# Patient Record
Sex: Male | Born: 1935 | Race: Black or African American | Hispanic: No | Marital: Married | Smoking: Former smoker
Health system: Southern US, Community
[De-identification: ages and names within clinical notes are randomized; demographics above are authoritative.]

## PROBLEM LIST (undated history)

## (undated) DIAGNOSIS — R61 Generalized hyperhidrosis: Secondary | ICD-10-CM

## (undated) DIAGNOSIS — J309 Allergic rhinitis, unspecified: Secondary | ICD-10-CM

## (undated) DIAGNOSIS — I6990 Unspecified sequelae of unspecified cerebrovascular disease: Secondary | ICD-10-CM

## (undated) DIAGNOSIS — T7840XA Allergy, unspecified, initial encounter: Secondary | ICD-10-CM

## (undated) DIAGNOSIS — H109 Unspecified conjunctivitis: Secondary | ICD-10-CM

## (undated) DIAGNOSIS — M545 Low back pain, unspecified: Secondary | ICD-10-CM

## (undated) DIAGNOSIS — K2100 Gastro-esophageal reflux disease with esophagitis, without bleeding: Secondary | ICD-10-CM

## (undated) DIAGNOSIS — R42 Dizziness and giddiness: Secondary | ICD-10-CM

## (undated) DIAGNOSIS — M542 Cervicalgia: Secondary | ICD-10-CM

## (undated) DIAGNOSIS — K5900 Constipation, unspecified: Secondary | ICD-10-CM

## (undated) DIAGNOSIS — D485 Neoplasm of uncertain behavior of skin: Secondary | ICD-10-CM

## (undated) DIAGNOSIS — I639 Cerebral infarction, unspecified: Secondary | ICD-10-CM

## (undated) DIAGNOSIS — M779 Enthesopathy, unspecified: Secondary | ICD-10-CM

## (undated) DIAGNOSIS — M1990 Unspecified osteoarthritis, unspecified site: Secondary | ICD-10-CM

## (undated) DIAGNOSIS — R21 Rash and other nonspecific skin eruption: Secondary | ICD-10-CM

## (undated) DIAGNOSIS — E785 Hyperlipidemia, unspecified: Secondary | ICD-10-CM

## (undated) DIAGNOSIS — R7309 Other abnormal glucose: Secondary | ICD-10-CM

## (undated) DIAGNOSIS — I10 Essential (primary) hypertension: Secondary | ICD-10-CM

## (undated) HISTORY — DX: Allergy, unspecified, initial encounter: T78.40XA

## (undated) HISTORY — DX: Unspecified conjunctivitis: H10.9

## (undated) HISTORY — PX: COLONOSCOPY: SHX174

## (undated) HISTORY — DX: Unspecified sequelae of unspecified cerebrovascular disease: I69.90

## (undated) HISTORY — DX: Low back pain, unspecified: M54.50

## (undated) HISTORY — DX: Gastro-esophageal reflux disease with esophagitis: K21.0

## (undated) HISTORY — DX: Other abnormal glucose: R73.09

## (undated) HISTORY — DX: Rash and other nonspecific skin eruption: R21

## (undated) HISTORY — DX: Essential (primary) hypertension: I10

## (undated) HISTORY — DX: Low back pain: M54.5

## (undated) HISTORY — DX: Gastro-esophageal reflux disease with esophagitis, without bleeding: K21.00

## (undated) HISTORY — DX: Neoplasm of uncertain behavior of skin: D48.5

## (undated) HISTORY — DX: Cervicalgia: M54.2

## (undated) HISTORY — DX: Generalized hyperhidrosis: R61

## (undated) HISTORY — DX: Enthesopathy, unspecified: M77.9

## (undated) HISTORY — DX: Hyperlipidemia, unspecified: E78.5

## (undated) HISTORY — DX: Dizziness and giddiness: R42

## (undated) HISTORY — DX: Unspecified osteoarthritis, unspecified site: M19.90

## (undated) HISTORY — DX: Allergic rhinitis, unspecified: J30.9

## (undated) HISTORY — DX: Cerebral infarction, unspecified: I63.9

## (undated) HISTORY — DX: Constipation, unspecified: K59.00

---

## 1999-06-05 ENCOUNTER — Other Ambulatory Visit: Admission: RE | Admit: 1999-06-05 | Discharge: 1999-06-05 | Payer: Self-pay | Admitting: Family Medicine

## 1999-07-23 ENCOUNTER — Encounter (INDEPENDENT_AMBULATORY_CARE_PROVIDER_SITE_OTHER): Payer: Self-pay | Admitting: Specialist

## 1999-07-23 ENCOUNTER — Ambulatory Visit (HOSPITAL_COMMUNITY): Admission: RE | Admit: 1999-07-23 | Discharge: 1999-07-23 | Payer: Self-pay | Admitting: Gastroenterology

## 2003-11-13 ENCOUNTER — Emergency Department (HOSPITAL_COMMUNITY): Admission: EM | Admit: 2003-11-13 | Discharge: 2003-11-13 | Payer: Self-pay | Admitting: Emergency Medicine

## 2005-10-27 ENCOUNTER — Inpatient Hospital Stay (HOSPITAL_COMMUNITY): Admission: EM | Admit: 2005-10-27 | Discharge: 2005-10-29 | Payer: Self-pay | Admitting: Emergency Medicine

## 2006-08-27 ENCOUNTER — Encounter: Admission: RE | Admit: 2006-08-27 | Discharge: 2006-08-27 | Payer: Self-pay | Admitting: *Deleted

## 2008-08-08 ENCOUNTER — Ambulatory Visit: Payer: Self-pay | Admitting: Cardiovascular Disease

## 2008-08-08 ENCOUNTER — Inpatient Hospital Stay (HOSPITAL_COMMUNITY): Admission: EM | Admit: 2008-08-08 | Discharge: 2008-08-10 | Payer: Self-pay | Admitting: Emergency Medicine

## 2008-08-09 ENCOUNTER — Encounter (INDEPENDENT_AMBULATORY_CARE_PROVIDER_SITE_OTHER): Payer: Self-pay | Admitting: Neurology

## 2008-11-28 ENCOUNTER — Emergency Department (HOSPITAL_COMMUNITY): Admission: EM | Admit: 2008-11-28 | Discharge: 2008-11-28 | Payer: Self-pay | Admitting: Emergency Medicine

## 2010-05-09 LAB — CBC
HCT: 31 % — ABNORMAL LOW (ref 39.0–52.0)
Hemoglobin: 10.6 g/dL — ABNORMAL LOW (ref 13.0–17.0)
MCHC: 34.2 g/dL (ref 30.0–36.0)
MCV: 90.1 fL (ref 78.0–100.0)
Platelets: 226 10*3/uL (ref 150–400)
RBC: 3.44 MIL/uL — ABNORMAL LOW (ref 4.22–5.81)
RDW: 12.8 % (ref 11.5–15.5)
WBC: 7.2 10*3/uL (ref 4.0–10.5)

## 2010-05-09 LAB — DIFFERENTIAL
Basophils Absolute: 0 10*3/uL (ref 0.0–0.1)
Basophils Relative: 0 % (ref 0–1)
Eosinophils Absolute: 0.2 10*3/uL (ref 0.0–0.7)
Eosinophils Relative: 3 % (ref 0–5)
Lymphocytes Relative: 21 % (ref 12–46)
Lymphs Abs: 1.5 10*3/uL (ref 0.7–4.0)
Monocytes Absolute: 0.3 10*3/uL (ref 0.1–1.0)
Monocytes Relative: 4 % (ref 3–12)
Neutro Abs: 5.2 10*3/uL (ref 1.7–7.7)
Neutrophils Relative %: 72 % (ref 43–77)

## 2010-05-09 LAB — BASIC METABOLIC PANEL
BUN: 8 mg/dL (ref 6–23)
CO2: 31 mEq/L (ref 19–32)
Calcium: 8.6 mg/dL (ref 8.4–10.5)
Chloride: 103 mEq/L (ref 96–112)
Creatinine, Ser: 0.77 mg/dL (ref 0.4–1.5)
GFR calc Af Amer: >60 mL/min (ref >60)
GFR calc non Af Amer: >60 mL/min (ref >60)
Glucose, Bld: 111 mg/dL — ABNORMAL HIGH (ref 70–99)
Potassium: 3.9 mEq/L (ref 3.5–5.1)
Sodium: 138 mEq/L (ref 135–145)

## 2010-05-09 LAB — CK TOTAL AND CKMB (NOT AT ARMC)
CK, MB: 4.7 ng/mL — ABNORMAL HIGH (ref 0.3–4.0)
Relative Index: 1.6 (ref 0.0–2.5)
Total CK: 296 U/L — ABNORMAL HIGH (ref 7–232)

## 2010-05-09 LAB — TROPONIN I: Troponin I: 0.01 ng/mL (ref 0.00–0.06)

## 2010-05-12 LAB — COMPREHENSIVE METABOLIC PANEL
ALT: 16 U/L (ref 0–53)
AST: 29 U/L (ref 0–37)
Albumin: 4 g/dL (ref 3.5–5.2)
Alkaline Phosphatase: 75 U/L (ref 39–117)
BUN: 12 mg/dL (ref 6–23)
CO2: 25 mEq/L (ref 19–32)
Calcium: 9.1 mg/dL (ref 8.4–10.5)
Chloride: 106 mEq/L (ref 96–112)
Creatinine, Ser: 0.68 mg/dL (ref 0.4–1.5)
GFR calc Af Amer: >60 mL/min (ref >60)
GFR calc non Af Amer: >60 mL/min (ref >60)
Glucose, Bld: 113 mg/dL — ABNORMAL HIGH (ref 70–99)
Potassium: 3.7 mEq/L (ref 3.5–5.1)
Sodium: 138 mEq/L (ref 135–145)
Total Bilirubin: 0.4 mg/dL (ref 0.3–1.2)
Total Protein: 7.9 g/dL (ref 6.0–8.3)

## 2010-05-12 LAB — CARDIAC PANEL(CRET KIN+CKTOT+MB+TROPI)
CK, MB: 7.3 ng/mL — ABNORMAL HIGH (ref 0.3–4.0)
Relative Index: 1.7 (ref 0.0–2.5)
Total CK: 425 U/L — ABNORMAL HIGH (ref 7–232)
Troponin I: 0.02 ng/mL (ref 0.00–0.06)

## 2010-05-12 LAB — URINALYSIS, ROUTINE W REFLEX MICROSCOPIC
Bilirubin Urine: NEGATIVE
Glucose, UA: NEGATIVE mg/dL
Hgb urine dipstick: NEGATIVE
Ketones, ur: NEGATIVE mg/dL
Nitrite: NEGATIVE
Protein, ur: NEGATIVE mg/dL
Specific Gravity, Urine: 1.015 (ref 1.005–1.030)
Urobilinogen, UA: 1 mg/dL (ref 0.0–1.0)
pH: 6 (ref 5.0–8.0)

## 2010-05-12 LAB — LIPID PANEL
Cholesterol: 150 mg/dL (ref 0–200)
HDL: 37 mg/dL — ABNORMAL LOW (ref >39)
LDL Cholesterol: 83 mg/dL (ref 0–99)
Total CHOL/HDL Ratio: 4.1 RATIO
Triglycerides: 151 mg/dL — ABNORMAL HIGH (ref <150)
VLDL: 30 mg/dL (ref 0–40)

## 2010-05-12 LAB — CBC
HCT: 35.6 % — ABNORMAL LOW (ref 39.0–52.0)
Hemoglobin: 12 g/dL — ABNORMAL LOW (ref 13.0–17.0)
MCHC: 33.7 g/dL (ref 30.0–36.0)
MCV: 88.2 fL (ref 78.0–100.0)
Platelets: 181 10*3/uL (ref 150–400)
RBC: 4.03 MIL/uL — ABNORMAL LOW (ref 4.22–5.81)
RDW: 13.6 % (ref 11.5–15.5)
WBC: 5.6 10*3/uL (ref 4.0–10.5)

## 2010-05-12 LAB — DIFFERENTIAL
Basophils Absolute: 0 10*3/uL (ref 0.0–0.1)
Basophils Relative: 1 % (ref 0–1)
Eosinophils Absolute: 0.1 10*3/uL (ref 0.0–0.7)
Eosinophils Relative: 2 % (ref 0–5)
Lymphocytes Relative: 33 % (ref 12–46)
Lymphs Abs: 1.9 10*3/uL (ref 0.7–4.0)
Monocytes Absolute: 0.2 10*3/uL (ref 0.1–1.0)
Monocytes Relative: 4 % (ref 3–12)
Neutro Abs: 3.3 10*3/uL (ref 1.7–7.7)
Neutrophils Relative %: 60 % (ref 43–77)

## 2010-05-12 LAB — PROTIME-INR
INR: 0.9 (ref 0.00–1.49)
Prothrombin Time: 12.7 seconds (ref 11.6–15.2)

## 2010-05-12 LAB — APTT: aPTT: 30 seconds (ref 24–37)

## 2010-05-12 LAB — VITAMIN B12: Vitamin B-12: 251 pg/mL (ref 211–911)

## 2010-05-12 LAB — TSH: TSH: 1.962 u[IU]/mL (ref 0.350–4.500)

## 2010-05-12 LAB — HEMOGLOBIN A1C
Hgb A1c MFr Bld: 6.2 % — ABNORMAL HIGH (ref 4.6–6.1)
Mean Plasma Glucose: 131 mg/dL

## 2010-06-18 NOTE — H&P (Signed)
NAME:  Troy Ortiz, Troy Ortiz NO.:  [id number]   MEDICAL RECORD NO.:  [id number]          PATIENT TYPE:  INP   LOCATION:  3028                         FACILITY:  [facility]   PHYSICIAN:  Casimiro Needle L. Reynolds, M.D.DATE OF BIRTH:  04/08/1935   DATE OF ADMISSION:  08/08/2008  DATE OF DISCHARGE:                              HISTORY & PHYSICAL   CHIEF COMPLAINT:  Unsteady gait for 1 month and abnormal MRI.   HISTORY OF PRESENT ILLNESS:  This is the initial [facility] admission for this 75 year old man with a past medical history  which includes hypertension.  He says that for the last month, he has  felt off balance.  He says his head feels heavy when he walks.  He  is not having any vertigo, denies any false motion, denies any numbness  or tingling in the feet.  He has a little bit of neck pain, but denies  low back pain or bowel or bladder symptoms.  He does not feel this has  acutely changed over the last few days, although it has generally gotten  worse.  He saw his primary care physician about a month ago and was  prescribed a nasal spray, which did not help.  He went back today, saw a  different physician, was told to go to the emergency department for  unclear reasons.  In the ED, he was noted to have an unsteady gait and  MRI was obtained which demonstrated an acute pontine stroke.  Subsequently, he was admitted at the Stroke Service for further  evaluation and management of this problem.  Again, he denies any acute  neurologic symptomatology, but has had this subacute symptoms of gait  progressing over the last month or so.   PAST MEDICAL HISTORY:  Hypertension, gastroesophageal reflux disease,  and allergies.   MEDICATIONS:  1. Amlodipine 20 mg daily.  2. Claritin daily.  3. Lisinopril 20 mg daily.  4. Lovastatin 40 mg b.i.d.  5. Protonix 40 mg daily.  6. Zyrtec Mucus Relief Sinus.   ALLERGIES:  No known drug allergies.   FAMILY HISTORY:   Negative for neuropathy.   SOCIAL HISTORY:  He is independent in his activities of daily living.  He is an active smoker.  He denies alcohol use.   REVIEW OF SYSTEMS:  NEUROLOGIC:  As above.  Otherwise full-10 system  review of systems is negative except as outlined in the HPI and in the  accompanying ER and admission nursing records.   PHYSICAL EXAMINATION:  VITAL SIGNS:  Temperature 98.3, blood pressure  154/94, pulse 52, respirations 20.  GENERAL:  This is a healthy-appearing man supine in hospital bed, in no  distress.  HEENT:  Head, cranium is normocephalic and atraumatic.  Oropharynx is  benign.  NECK:  Supple without carotid or supraclavicular bruits.  HEART:  Regular rate and rhythm without murmurs.  NEUROLOGIC:  Mental status, he is awake and alert.  He is oriented to  time and place.  Recent and remote memory are intact.  Speech is fluent  and not dysarthric.  Cranial nerves, pupils  equal and briskly reactive.  Extraocular movements are full without nystagmus.  Visual fields full to  confrontation.  Face, tongue and palate move normally and symmetrically.  Motor; normal bulk and tone, normal strength in all tested extremity  muscles.  Sensation is intact to pinprick and light touch in all  extremities.  Coordination, finger-to-nose and heel-to-shin performed  accurately.  Gait, he arises easily from a chair.  His stance is  somewhat wide based.  He has difficulty with narrow stance, falling with  a Romberg maneuver.  Reflexes are 2+ in the upper extremities and 1+ in  the lower stress.  Toes are downgoing bilaterally.   LABORATORY DATA:  CBC, white count 5.6, hemoglobin 12.0, platelets  181,000.  Coags are normal.  BMET is remarkable for an elevated glucose  of 113, otherwise normal.  LFTs are normal.  EKG demonstrates sinus  bradycardia without acute injury.  Chest x-ray is normal.  MRI of the  brain is personally reviewed which demonstrates an acute infarct in the   right dorsal pons with advanced small vessel disease in the pons and in  the cerebral hemispheres   IMPRESSION:  1. Acute pontine stroke by imaging without clinical symptoms.  2. Gait disorder.  Possibly diffuse subcortical white matter disease      requiring workup.   PLAN:  We will admit for CVA workup including MRA of the head and neck,  carotid and transcranial Dopplers, 2-D echo, and stroke labs.  We will  initiate aspirin therapy.  Also, we will work up his gait further with  labs and C-spine MRI.  He will also need an NCV as outpatient.  May be  able to discharge home with the completion of a stroke workup.  Stroke  Service to follow.       Michael L. Thad Ranger, M.D.  Electronically Signed     MLR/MEDQ  D:  08/08/2008  T:  08/09/2008  Job:  [id number]

## 2010-06-18 NOTE — Discharge Summary (Signed)
NAME:  Troy Ortiz, Troy Ortiz              ACCOUNT NO.:  [id number]   MEDICAL RECORD NO.:  [id number]          PATIENT TYPE:  INP   LOCATION:  3028                         FACILITY:  [facility]   PHYSICIAN:  Pramod P. Pearlean Brownie, MD    DATE OF BIRTH:  1935/03/31   DATE OF ADMISSION:  08/08/2008  DATE OF DISCHARGE:  08/10/2008                               DISCHARGE SUMMARY   DISCHARGE DIAGNOSES:  1. 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  2. Hypertension.  3. Dyslipidemia.  4. Gastroesophageal reflux disease.   DISCHARGE MEDICINES:  1. Amlodipine 20 mg a day.  2. Lovastatin 40 mg a day.  3. Lisinopril 20 mg a day.  4. Protonix 40 mg a day.  5. Aspirin 325 mg a day.   STUDIES PERFORMED:  1. MRI of the brain on admission shows acute subcentimeter infarct      without mass effect or hemorrhage in the dorsal pons on the right,      underlying advanced chronic small and medium size vessel ischemic      changes.  2. MRA of the head shows left pica occlusion, likely chronic, mild      posterior circulation atherosclerosis, more pronounced anterior      circulation, generalized atherosclerosis.  There is moderate      stenosis of the right ACA  A2 segment and no other focal stenosis      identified.  3. MRA of the neck shows atherosclerosis involving the right ICA      origin, dominant distal left vertebral artery and left common      carotid artery.  No hemodynamically significant stenosis      identified.  4. MRI of cervical spine shows advanced multilevel multifactorial      cervical and upper thoracic degenerative changes, mild spinal      stenosis, and mild mass effect on the spinal cord at C4-5 and C6-7,      severe neural foraminal stenosis on the right at C4, bilaterally at      C5, and bilaterally at C7.  5. Chest x-ray shows mild bibasilar atelectasis.  6. 2-D echocardiogram shows EF of 55-65%.  No PFO identified.  No      obvious source  of embolus.  7. Carotid Doppler shows no significant ICA stenosis.  8. TCD completed, results pending.  9. EKG shows sinus bradycardia.   LABORATORY STUDIES:  Vitamin B12 of 251.  TSH 1.962.  Hemoglobin A1c  6.2.  Cholesterol 150, triglycerides 151, HDL 37, and LDL 83.  Urinalysis normal.  Cardiac enzymes with CK 425, CK-MB 7.3, troponin  normal.  Coagulation studies normal.  Chemistry with glucose 113,  otherwise normal.  Liver function tests normal.  CBC with hemoglobin 12,  hematocrit 35.6, white blood cells 5.6, and platelets 181, otherwise  normal.   HISTORY OF PRESENT ILLNESS:  Troy Ortiz is a 75 year old right-  handed male with a past medical history of hypertension.  He states for  the last month he is felt off balance.  He states his  head feels heavy  when he walks.  He is not having any vertigo.  Denies any self-motion.  Denies any numbness or tingling in the feet.  He complains of a little  bit of neck pain, but denies low back pain or bowel or bladder symptoms.  He does not feel this has acutely changed over the last few days,  although it has generally gotten worse.  He saw his primary care  physician about 1 month ago and was prescribed a nasal spray which did  not help.  He returned to his primary care physician the day of  admission, saw a different physician and was told to go to the emergency  room for unclear reasons.  In the ED, the patient was noted to have an  unsteady gait and MRI was obtained which demonstrated an acute pontine  stroke.  Subsequently, he was admitted to the Stroke Service for further  evaluation and management.  He denies any new neurologic symptomatology  on the day of admission, but has had the subacute symptom of abnormal  gait progressing over the past month or so.  He was not a tPA candidate  secondary to time of onset being greater than time window.  He was  admitted to the hospital for further evaluation.   HOSPITAL COURSE:   MRI as previously discussed is positive for pontine  infarct without any new clinical symptoms.  He was not on antiplatelet  prior to admission and is placed on aspirin for secondary stroke  prevention.  It is felt his stroke was secondary to small vessel disease  as no other risk factors were identified.  The patient needs ongoing  evaluation of risk factors as an outpatient and management by his  primary care physician.  He was evaluated by PT, OT, and Speech Therapy  in the hospital and felt to have no followup therapy needs.  We will  consider the patient for IRIS trial which is use of Amaryl and post-  stroke patients looking at stroke prevention.  Research staff discussed  this with him after discharge.   CONDITION ON DISCHARGE:  The patient is alert and oriented x3.  No  aphasia.  No dysarthria.  Eye movements are full.  Face is symmetric.  He has no focal weakness and his gait is steady.   DISCHARGE/PLAN:  1. Discharge home with family.  2. Aspirin for secondary stroke prevention.  3. Ongoing risk factor control by primary care physician, please      follow up within 1 month.  4. Follow up with Dr. Pearlean Brownie in 2 months.  5. Consider IRIS trial.      Annie Main, N.P.    ______________________________  Sunny Schlein. Pearlean Brownie, MD    SB/MEDQ  D:  08/10/2008  T:  08/11/2008  Job:  [id number]   cc:   [facility]

## 2010-06-21 NOTE — H&P (Signed)
NAME:  Troy Ortiz, Troy Ortiz NO.:  [id number]   MEDICAL RECORD NO.:  [id number]          PATIENT TYPE:  EMS   LOCATION:  MAJO                         FACILITY:  [facility]   PHYSICIAN:  Corinna L. Lendell Caprice, MDDATE OF BIRTH:  11/06/1928   DATE OF ADMISSION:  10/27/2005  DATE OF DISCHARGE:                                HISTORY & PHYSICAL   CHIEF COMPLAINT:  Vomiting, diarrhea and weakness.   HISTORY OF PRESENT ILLNESS:  Troy Ortiz is a 75 year old black male patient  of Dr. Clovis Riley, who presents to the emergency room with vomiting today.  He  has had chills.  He also had a loose stool this morning.  He has been on  amoxicillin for an ear infection for the past 10 days, and he has no  abdominal pain, no cough, no dysuria, and he feels very weak.   PAST MEDICAL HISTORY:  1. Hypertension.  2. Hyperlipidemia.  3. Gastroesophageal reflux disease.   MEDICATIONS:  1. Lovastatin 40 mg a day.  2. Hydrochlorothiazide 25 mg a day.  3. Protonix 40 mg a day.  4. Amoxicillin which was completed.  5. Norvasc 10 mg a day.   No known drug allergies.   SOCIAL HISTORY:  The patient does not drink or smoke.  He is married.  He is  retired.   FAMILY HISTORY:  Noncontributory.   REVIEW OF SYSTEMS:  As above, otherwise negative.   PHYSICAL EXAMINATION:  VITAL SIGNS:  His temperature is 96.9.  Blood  pressure initially was 215/74 and is currently 171/79.  Pulse has ranged  from 50 to 60.  Respiratory rate 16.  Oxygen saturation 100% on room air.  GENERAL:  The patient is an ill-appearing black male, appears younger than  his stated age.  HEENT: Normocephalic, atraumatic.  Pupils equal, round, reactive to light.  Sclerae are nonicteric.  Dry mucous membranes.  Tympanic membranes are  without erythema or fluid.  No bulging.  NECK:  Supple.  No lymphadenopathy.  LUNGS:  Clear to auscultation bilaterally without wheezes, rhonchi or rales.  CARDIOVASCULAR:  Regular rate and rhythm  without murmurs, gallops or rubs.  ABDOMEN:  Normal bowel sounds, soft, nontender, nondistended.  GU, RECTAL:  Deferred.  EXTREMITIES:  No clubbing, cyanosis or edema.  SKIN:  No rash.  PSYCHIATRIC:  Normal affect.  NEUROLOGIC:  Alert, oriented.  Cranial nerves and sensorimotor exam are  intact.   LABORATORY DATA:  CBC is unremarkable.  Basic metabolic panel is significant  for a potassium of 2.5, glucose of 288.  Point care enzymes negative.  UA  shows a specific gravity of 1.023, urine glucose 250, 15 ketones, trace  blood, greater than 300 protein, negative nitrite, negative leukocyte  esterase.  EKG shows sinus bradycardia with a rate of 54.   ASSESSMENT/PLAN:  1. Nausea, vomiting, diarrhea.  He will get supportive care, antibiotics,      IV fluids, clear liquid diet.  Check stool for C. difficile.  2. Hypokalemia.  This will be repleted.  3. Sinus bradycardia.  He will be monitored on telemetry.  4. Hyperglycemia.  The patient  has no history of diabetes.  I will monitor      blood glucose and check a hemoglobin A1c.  5. Hypertension.  I will give IV Vasotec as needed and continue his      Norvasc.  6. Hyperlipidemia.  7. Gastroesophageal reflux disease.      Corinna L. Lendell Caprice, MD  Electronically Signed     CLS/MEDQ  D:  10/27/2005  T:  10/29/2005  Job:  [id number]   cc:   L. Lupe Carney, M.D.

## 2010-06-21 NOTE — Procedures (Signed)
[place]  Patient:    Troy Ortiz, Troy Ortiz                       MRN: [id number] Proc. Date: 07/23/99 Adm. Date:  [id number] Disc. Date: [id number] Attending:  Rich Brave CC:         Abran Cantor. Clovis Riley, MD                           Procedure Report  PROCEDURES:  Colonoscopy with polypectomy.  DATE OF BIRTH:  [birth year]  INDICATIONS:  This is a 75 year old African-American male with a large polyp noted recently on a sigmoidoscopic evaluation in Dr. Melvyn Neth D. Mitchells office.  Biopsies of it showed it to be benign, but adenomatous in character.  FINDINGS:  Large (2 x 4 cm) pedunculated rectal polyp removed.  Small semi-pedunculated cecal polyp removed.  DESCRIPTION OF PROCEDURE:  The nature, purpose, and risks of the procedure had been discussed with the patient, who provided written consent.  Sedation was fentanyl 100 mcg and Versed 10 mg IV without arrhythmia or desaturations, administered prior to and during the course of the procedure.  The digital exam of the prostate gland was inhibited by increased anal sphincter tone, but no obvious distal prostatic abnormalities were palpated, although it is not really felt that the gland was able to be reached during this exam.  The Olympus adult video colonoscope was inserted and advanced through a somewhat tortuous sigmoid region and then with some looping overcome by taking out loops, putting the patient in the supine position, and applying external abdominal compression in the lower abdomen.  We reached the cecum as identified by clear visualization of the appendiceal orifice and the ileocecal valve.  On a fold at the base of the cecum was a 5 mm semi-pedunculated polyp removed by snare technique with complete hemostasis and no evidence of excessive cautery.  The polyp fragmented somewhat in the process of being suctioned through the scope, but about three or four small pieces were retrieved  for histologic analysis.  The only other polyp on this exam was the one seen by Dr. Clovis Riley during the flexible sigmoidoscopy.  It was very large and on a short stalk.  It was approximately 2 cm across and 4 cm in length and had a benign appearance, but was somewhat hemorrhagic in character.  The base of the polyp was injected with 1 cc of 1:100,000 epinephrine with excellent blanching and a nice bleb formation.  The polypectomy snare was able to be slipped around the polyp and placed right at the level of the stalk, which was now somewhat enlarged by the above-mentioned epinephrine injection.  Transection was easy and led to complete hemostasis and no evidence of excessive cautery.  The cauterized section of the polyp, as well as the cauterized polypectomy site were both morphologically free of any residual polyp tissue.  Retroflexion in the rectum prior to removal of this polyp showed moderate internal hemorrhoids.  No other polyps were observed other than the two mentioned above nor did I see any evidence of cancer, colitis, vascular malformations, or diverticulosis.  The patient tolerated this procedure well and there were no apparent complications.  IMPRESSION: 1. Large rectal polyp removed at about 10 cm from the external anal opening. 2. Small cecal polyp removed. 3. Otherwise normal exam.  PLAN:  Await histologic analysis.  Consider sigmoidoscopic evaluation in a year to confirm  that the polypectomy site remains free of any recurrent tissue with full colonoscopy anticipated in about three years. DD:  07/23/99 TD:  07/25/99 Job: [id number]

## 2010-07-22 ENCOUNTER — Ambulatory Visit
Admission: RE | Admit: 2010-07-22 | Discharge: 2010-07-22 | Disposition: A | Discharge status: Home / Self Care | Payer: Medicare Other | Source: Ambulatory Visit | Attending: Internal Medicine | Admitting: Internal Medicine

## 2010-07-22 ENCOUNTER — Other Ambulatory Visit (HOSPITAL_BASED_OUTPATIENT_CLINIC_OR_DEPARTMENT_OTHER): Payer: Self-pay | Admitting: Internal Medicine

## 2010-07-22 DIAGNOSIS — M549 Dorsalgia, unspecified: Secondary | ICD-10-CM

## 2010-12-24 ENCOUNTER — Encounter: Payer: Self-pay | Admitting: Gastroenterology

## 2011-01-10 ENCOUNTER — Encounter: Payer: Self-pay | Admitting: Gastroenterology

## 2011-01-10 ENCOUNTER — Ambulatory Visit (INDEPENDENT_AMBULATORY_CARE_PROVIDER_SITE_OTHER): Payer: Medicare Other | Admitting: Gastroenterology

## 2011-01-10 VITALS — BP 144/80 | HR 60 | Ht 67.0 in | Wt 179.0 lb

## 2011-01-10 DIAGNOSIS — Z86010 Personal history of colonic polyps: Secondary | ICD-10-CM

## 2011-01-10 DIAGNOSIS — I10 Essential (primary) hypertension: Secondary | ICD-10-CM | POA: Insufficient documentation

## 2011-01-10 MED ORDER — PEG-KCL-NACL-NASULF-NA ASC-C 100 G PO SOLR: 1.0000 | ORAL | Status: DC

## 2011-01-10 NOTE — Patient Instructions (Signed)
You will be set up for a colonoscopy. A copy of this information will be made available to Dr. Leanord Hawking.

## 2011-01-10 NOTE — Progress Notes (Signed)
HPI: This is a  very pleasant 75 year old man whom I am meeting for the first time today.   Has been having trouble with bowels.  Has been very bloated lately.  No nausea or vomiting.  No overt GI bleeding.   HAs intermittent gas pains, maalox usually helps.    Very hard to understand even though he speaks [place], mumbles a lot.   Does have to push and strain a lot.   He had a colonoscopy in June 2001. A large, 2 cm x 4 cm pedunculated rectal polyp was removed. A smaller cecal polyp was also removed. The rectal polyp proved to be a tubulovillous adenoma in the cecal polyp was an adenoma.  Has not had colonoscopy since 2001 Buccini exam.  Says he received a letter from them last year saying he needed another colonoscopy.  Review of systems: Pertinent positive and negative review of systems were noted in the above HPI section. Complete review of systems was performed and was otherwise normal.    Past Medical History  Diagnosis Date  . Cervicalgia   . Lumbago   . Unspecified constipation   . Rash and other nonspecific skin eruption   . Conjunctivitis unspecified   . Unspecified late effects of cerebrovascular disease   . Anemia, unspecified   . Generalized hyperhidrosis   . Other abnormal glucose   . Neoplasm of uncertain behavior of skin   . Enthesopathy of unspecified site   . Other and unspecified hyperlipidemia   . Unspecified essential hypertension   . Reflux esophagitis   . Dizziness and giddiness   . Allergic rhinitis, cause unspecified     No past surgical history on file.  Current Outpatient Prescriptions  Medication Sig Dispense Refill  . amLODipine (NORVASC) 10 MG tablet Take 10 mg by mouth daily.        . furosemide (LASIX) 20 MG tablet Take 20 mg by mouth 2 (two) times daily.        . simvastatin (ZOCOR) 40 MG tablet Take 40 mg by mouth at bedtime.          Allergies as of 01/10/2011  . (No Known Allergies)    Family History  Problem Relation Age of  Onset  . Heart disease Mother     History   Social History  . Marital Status: Married    Spouse Name: N/A    Number of Children: 9  . Years of Education: N/A   Occupational History  . Curator     retired   Social History Main Topics  . Smoking status: Former Games developer  . Smokeless tobacco: Never Used  . Alcohol Use: No  . Drug Use: No  . Sexually Active: Not on file   Other Topics Concern  . Not on file   Social History Narrative  . No narrative on file       Physical Exam: BP 144/80  Pulse 60  Ht 5\' 7"  (1.702 m)  Wt 179 lb (81.194 kg)  BMI 28.04 kg/m2 Constitutional: generally well-appearing Psychiatric: alert and oriented x3 Eyes: extraocular movements intact Mouth: oral pharynx moist, no lesions Neck: supple no lymphadenopathy Cardiovascular: heart regular rate and rhythm Lungs: clear to auscultation bilaterally Abdomen: soft, nontender, nondistended, no obvious ascites, no peritoneal signs, normal bowel sounds Extremities: no lower extremity edema bilaterally Skin: no lesions on visible extremities    Assessment and plan: 75 y.o. male with  large polyp in: 2001  She had a quite large rectal villous adenoma  removed in 2001. Currently he is having bloating, bowel irregularity and I think we should proceed with colonoscopy at his soonest convenience.

## 2011-01-13 ENCOUNTER — Encounter: Payer: Self-pay | Admitting: Gastroenterology

## 2011-01-31 ENCOUNTER — Ambulatory Visit (AMBULATORY_SURGERY_CENTER): Payer: Medicare Other | Admitting: Gastroenterology

## 2011-01-31 ENCOUNTER — Encounter: Payer: Self-pay | Admitting: Gastroenterology

## 2011-01-31 DIAGNOSIS — D126 Benign neoplasm of colon, unspecified: Secondary | ICD-10-CM

## 2011-01-31 DIAGNOSIS — Z86010 Personal history of colonic polyps: Secondary | ICD-10-CM

## 2011-01-31 DIAGNOSIS — Z1211 Encounter for screening for malignant neoplasm of colon: Secondary | ICD-10-CM

## 2011-01-31 LAB — HM COLONOSCOPY

## 2011-01-31 MED ORDER — SODIUM CHLORIDE 0.9 % IV SOLN: 500.0000 mL | INTRAVENOUS | Status: DC

## 2011-01-31 NOTE — Patient Instructions (Signed)
FOLLOW THE DISCHARGE INSTRUCTIONS ON THE GREEN AND BLUE DISCHARGE SHEETS.  CONTINUE YOUR MEDICATIONS.  START A DAILY FIBER SUPPLEMENT, POWDERED CITRACEL, FOR YOUR BOWEL IRREGULARITY.  AWAIT PATHOLOGY RESULTS.

## 2011-01-31 NOTE — Op Note (Signed)
[facility] [address] [place], [place]  [address]  COLONOSCOPY PROCEDURE REPORT  PATIENT:  Troy Ortiz, Troy Ortiz  MR#:  [id number] BIRTHDATE:  [birth year], 75 yrs. old  GENDER:  male ENDOSCOPIST:  Rachael Fee, MD REF. BY:  Baltazar Najjar, M.D. PROCEDURE DATE:  01/31/2011 PROCEDURE:  Colonoscopy with biopsy and snare polypectomy ASA CLASS:  Class II INDICATIONS:  large polyp removed in 2001 by Dr. Matthias Hughs, no colonoscopy since then; bloating, alternating bowel habits MEDICATIONS:  Fentanyl 50 mcg IV, These medications were titrated to patient response per physician's verbal order, Versed 6 mg IV  DESCRIPTION OF PROCEDURE:   After the risks benefits and alternatives of the procedure were thoroughly explained, informed consent was obtained.  Digital rectal exam was performed and revealed no rectal masses.   The LB 180AL [id number] endoscope was introduced through the anus and advanced to the cecum, which was identified by both the appendix and ileocecal valve, without limitations.  The quality of the prep was good..  The instrument was then slowly withdrawn as the colon was fully examined. <<PROCEDUREIMAGES>> FINDINGS:  There were multiple polyps identified and removed. There were two small sessile polyps, ranging from 2-81mm across. These were located in ascending and descending colon, one was removed with forceps and one was removed with cold snare and both were sent to pathology (jar 1) (see image4, image5, and image6). This was otherwise a normal examination of the colon (see image7, image1, and image3).   Retroflexed views in the rectum revealed no abnormalities. COMPLICATIONS:  None  ENDOSCOPIC IMPRESSION: 1) Two small polyps, both were removed and both were sent to pathology 2) Otherwise normal examination  RECOMMENDATIONS: 1) Given your personal history of adenomatous (pre-cancerous) polyps, you will need a repeat colonoscopy in 5 years even if the polyps  removed today are NOT precancerous 2) You will receive a letter within 1-2 weeks with the results of your biopsy as well as final recommendations. Please call my office if you have not received a letter after 3 weeks. 3) Please start once daily fiber supplement (powdered citrucel) for you bowel irregularity  ______________________________ Rachael Fee, MD  n. eSIGNED:   Rachael Fee at 01/31/2011 09:57 AM  Venetia Constable, [phone]

## 2011-01-31 NOTE — Progress Notes (Signed)
Patient did not experience any of the following events: a burn prior to discharge; a fall within the facility; wrong site/side/patient/procedure/implant event; or a hospital transfer or hospital admission upon discharge from the facility. (G8907) Patient did not have preoperative order for IV antibiotic SSI prophylaxis. (G8918)  

## 2011-01-31 NOTE — Progress Notes (Signed)
The pt tolerated the colonoscopy very well. maw 

## 2011-02-03 ENCOUNTER — Telehealth: Payer: Self-pay | Admitting: *Deleted

## 2011-02-03 NOTE — Telephone Encounter (Signed)

## 2012-03-23 ENCOUNTER — Other Ambulatory Visit: Payer: Self-pay | Admitting: Otolaryngology

## 2012-03-23 DIAGNOSIS — H905 Unspecified sensorineural hearing loss: Secondary | ICD-10-CM

## 2012-03-23 DIAGNOSIS — H9319 Tinnitus, unspecified ear: Secondary | ICD-10-CM

## 2012-03-30 ENCOUNTER — Ambulatory Visit
Admission: RE | Admit: 2012-03-30 | Discharge: 2012-03-30 | Disposition: A | Discharge status: Home / Self Care | Payer: Medicare Other | Source: Ambulatory Visit | Attending: Otolaryngology | Admitting: Otolaryngology

## 2012-03-30 DIAGNOSIS — H9319 Tinnitus, unspecified ear: Secondary | ICD-10-CM

## 2012-03-30 MED ORDER — GADOBENATE DIMEGLUMINE 529 MG/ML IV SOLN
15.0000 mL | Freq: Once | INTRAVENOUS | Status: AC | PRN
  Administered 2012-03-30: 15 mL via INTRAVENOUS

## 2012-05-20 ENCOUNTER — Other Ambulatory Visit (HOSPITAL_BASED_OUTPATIENT_CLINIC_OR_DEPARTMENT_OTHER): Payer: Self-pay | Admitting: Internal Medicine

## 2013-01-24 ENCOUNTER — Ambulatory Visit (INDEPENDENT_AMBULATORY_CARE_PROVIDER_SITE_OTHER): Payer: Medicare Other | Admitting: *Deleted

## 2013-01-24 DIAGNOSIS — Z23 Encounter for immunization: Secondary | ICD-10-CM

## 2013-02-04 ENCOUNTER — Encounter: Payer: Self-pay | Admitting: *Deleted

## 2013-02-08 ENCOUNTER — Ambulatory Visit (INDEPENDENT_AMBULATORY_CARE_PROVIDER_SITE_OTHER): Payer: Commercial Managed Care - HMO | Admitting: Internal Medicine

## 2013-02-08 ENCOUNTER — Encounter: Payer: Self-pay | Admitting: Internal Medicine

## 2013-02-08 VITALS — BP 146/70 | HR 62 | Temp 98.0°F | Wt 171.0 lb

## 2013-02-08 DIAGNOSIS — E782 Mixed hyperlipidemia: Secondary | ICD-10-CM | POA: Insufficient documentation

## 2013-02-08 DIAGNOSIS — M545 Low back pain, unspecified: Secondary | ICD-10-CM | POA: Insufficient documentation

## 2013-02-08 DIAGNOSIS — D649 Anemia, unspecified: Secondary | ICD-10-CM

## 2013-02-08 DIAGNOSIS — M533 Sacrococcygeal disorders, not elsewhere classified: Secondary | ICD-10-CM

## 2013-02-08 DIAGNOSIS — I10 Essential (primary) hypertension: Secondary | ICD-10-CM

## 2013-02-08 DIAGNOSIS — Z23 Encounter for immunization: Secondary | ICD-10-CM

## 2013-02-08 DIAGNOSIS — R42 Dizziness and giddiness: Secondary | ICD-10-CM | POA: Insufficient documentation

## 2013-02-08 DIAGNOSIS — E785 Hyperlipidemia, unspecified: Secondary | ICD-10-CM

## 2013-02-08 MED ORDER — MECLIZINE HCL 25 MG PO CHEW: 1.0000 | CHEWABLE_TABLET | Freq: Two times a day (BID) | ORAL | Status: DC

## 2013-02-08 MED ORDER — LISINOPRIL 5 MG PO TABS: 5.0000 mg | ORAL_TABLET | Freq: Every day | ORAL | Status: DC

## 2013-02-08 MED ORDER — AMLODIPINE BESYLATE 10 MG PO TABS: ORAL_TABLET | ORAL | Status: DC

## 2013-02-08 MED ORDER — ASPIRIN 81 MG PO TABS: 81.0000 mg | ORAL_TABLET | Freq: Every day | ORAL | Status: DC

## 2013-02-08 MED ORDER — TETANUS-DIPHTH-ACELL PERTUSSIS 5-2.5-18.5 LF-MCG/0.5 IM SUSP: 0.5000 mL | Freq: Once | INTRAMUSCULAR | Status: DC

## 2013-02-08 MED ORDER — SIMVASTATIN 40 MG PO TABS: 40.0000 mg | ORAL_TABLET | Freq: Every day | ORAL | Status: DC

## 2013-02-08 NOTE — Progress Notes (Signed)
Patient ID: Troy Ortiz, male   DOB: Sep 25, 1935, 78 y.o.   MRN: [id number]    HPI 78 y/o male patient here for follow up visit after a year. He was seeing Dr Dellia Nims prior to this. He continues to have dizziness. He has history of BPV. He used to take meclizine prn in the past but has stopped taking it.  SBP elevated in the office. He has been having elevated bp at home. He is on amlodipine 10 mg daily and furosemide 20 mg bid but is taking it only when needed. He does not want to take furosemide as it makes him urinate a lot No recent blood work  Review of Systems  Constitutional: Negative for fever, chills, weight loss, malaise/fatigue and diaphoresis.  HENT: Negative for congestion, hearing loss and sore throat.   Eyes: Negative for blurred vision, double vision and discharge. wears corrective lenses. Last seen eye doctor  Year and half back Respiratory: Negative for cough, sputum production, shortness of breath and wheezing.   Cardiovascular: Negative for chest pain, palpitations, orthopnea and leg swelling.  Gastrointestinal: Negative for heartburn, nausea, vomiting, abdominal pain, diarrhea and constipation.  Genitourinary: Negative for dysuria, urgency, frequency and flank pain.  Musculoskeletal: Negative for falls and myalgias. has degenerative changes in lumbar spine Skin: Negative for itching and rash.  Neurological: Negative for tingling, focal weakness and headaches.  Psychiatric/Behavioral: Negative for depression and memory loss. The patient is not nervous/anxious.    Past Medical History  Diagnosis Date  . Cervicalgia   . Lumbago   . Unspecified constipation   . Rash and other nonspecific skin eruption   . Conjunctivitis unspecified   . Unspecified late effects of cerebrovascular disease   . Anemia, unspecified   . Generalized hyperhidrosis   . Other abnormal glucose   . Neoplasm of uncertain behavior of skin   . Enthesopathy of unspecified site   . Other and  unspecified hyperlipidemia   . Unspecified essential hypertension   . Reflux esophagitis   . Dizziness and giddiness   . Allergic rhinitis, cause unspecified   . Arthritis   . Asthma     AS A CHILD  . Stroke     MINI STROKES   Past Surgical History  Procedure Laterality Date  . Colonoscopy     Medication reviewed. See [facility]  Physical exam BP 146/70  Pulse 62  Temp(Src) 98 F (36.7 C) (Oral)  Wt 171 lb (77.565 kg)  SpO2 98%  General- elderly male in no acute distress Head- atraumatic, normocephalic Eyes- PERRLA, EOMI, no pallor, no icterus Neck- no lymphadenopathy, no thyromegaly, no jugular vein distension, no carotid bruit Chest- no chest wall deformities, no chest wall tenderness Cardiovascular- normal s1,s2, no murmurs/ rubs/ gallops Respiratory- bilateral clear to auscultation, no wheeze, no rhonchi, no crackles Abdomen- bowel sounds present, soft, non tender Musculoskeletal- able to move all 4 extremities, no spinal and paraspinal tenderness, steady gait, no use of assistive device Neurological- no focal deficit Psychiatry- alert and oriented to person, place and time, normal mood and affect  Assessment/plan  1. Need for prophylactic vaccination with combined diphtheria-tetanus-pertussis (DTP) vaccine - Tdap (BOOSTRIX) 5-2.5-18.5 LF-MCG/0.5 injection; Inject 0.5 mLs into the muscle once.  Dispense: 0.5 mL; Refill: 0  2. Hyperlipidemia Continue zocor for now, check flp today - Lipid Panel  3. Dizziness Will have him on meclizine 25 mg bid and reassess next visit. If no imporvement, consider ENT referral given tinnitus with his dizziness  4. Anemia No recent  h/h. Check cbc today. normal energy level. No blood in stool - CBC with Differential  5. Back pain, lumbosacral Pain under control. Off all medication at present  6. HTN (hypertension) Elevated bp readings. Will continue amlodipine 10 mg daily and have him started on lisinopril 5 mg daily. Will d/c  furosemide - CMP

## 2013-02-09 ENCOUNTER — Encounter: Payer: Self-pay | Admitting: *Deleted

## 2013-02-09 LAB — COMPREHENSIVE METABOLIC PANEL
A/G RATIO: 1.8 (ref 1.1–2.5)
ALBUMIN: 4.4 g/dL (ref 3.5–4.8)
ALT: 12 IU/L (ref 0–44)
AST: 19 IU/L (ref 0–40)
Alkaline Phosphatase: 69 IU/L (ref 39–117)
BUN / CREAT RATIO: 15 (ref 10–22)
BUN: 14 mg/dL (ref 8–27)
CO2: 23 mmol/L (ref 18–29)
Calcium: 9.5 mg/dL (ref 8.6–10.2)
Chloride: 100 mmol/L (ref 97–108)
Creatinine, Ser: 0.91 mg/dL (ref 0.76–1.27)
GFR calc Af Amer: 95 mL/min/{1.73_m2} (ref >59)
GFR, EST NON AFRICAN AMERICAN: 82 mL/min/{1.73_m2} (ref >59)
Globulin, Total: 2.5 g/dL (ref 1.5–4.5)
Glucose: 113 mg/dL — ABNORMAL HIGH (ref 65–99)
Potassium: 4.2 mmol/L (ref 3.5–5.2)
Sodium: 142 mmol/L (ref 134–144)
Total Bilirubin: 0.2 mg/dL (ref 0.0–1.2)
Total Protein: 6.9 g/dL (ref 6.0–8.5)

## 2013-02-09 LAB — LIPID PANEL
CHOL/HDL RATIO: 3.8 ratio (ref 0.0–5.0)
Cholesterol, Total: 190 mg/dL (ref 100–199)
HDL: 50 mg/dL (ref >39)
LDL CALC: 122 mg/dL — AB (ref 0–99)
TRIGLYCERIDES: 89 mg/dL (ref 0–149)
VLDL Cholesterol Cal: 18 mg/dL (ref 5–40)

## 2013-02-09 LAB — CBC WITH DIFFERENTIAL/PLATELET
BASOS: 0 %
Basophils Absolute: 0 10*3/uL (ref 0.0–0.2)
EOS: 4 %
Eosinophils Absolute: 0.2 10*3/uL (ref 0.0–0.4)
HCT: 35.2 % — ABNORMAL LOW (ref 37.5–51.0)
Hemoglobin: 12.1 g/dL — ABNORMAL LOW (ref 12.6–17.7)
Immature Grans (Abs): 0 10*3/uL (ref 0.0–0.1)
Immature Granulocytes: 0 %
LYMPHS ABS: 1.9 10*3/uL (ref 0.7–3.1)
Lymphs: 38 %
MCH: 29.4 pg (ref 26.6–33.0)
MCHC: 34.4 g/dL (ref 31.5–35.7)
MCV: 86 fL (ref 79–97)
Monocytes Absolute: 0.3 10*3/uL (ref 0.1–0.9)
Monocytes: 6 %
NEUTROS ABS: 2.5 10*3/uL (ref 1.4–7.0)
Neutrophils Relative %: 52 %
RBC: 4.11 x10E6/uL — ABNORMAL LOW (ref 4.14–5.80)
RDW: 13.6 % (ref 12.3–15.4)
WBC: 4.9 10*3/uL (ref 3.4–10.8)

## 2013-05-11 ENCOUNTER — Encounter: Payer: Self-pay | Admitting: Internal Medicine

## 2013-05-11 ENCOUNTER — Ambulatory Visit (INDEPENDENT_AMBULATORY_CARE_PROVIDER_SITE_OTHER): Payer: Commercial Managed Care - HMO | Admitting: Internal Medicine

## 2013-05-11 VITALS — BP 124/68 | HR 57 | Temp 97.5°F | Resp 12 | Ht 66.0 in | Wt 173.2 lb

## 2013-05-11 DIAGNOSIS — Z0000 Encounter for general adult medical examination without abnormal findings: Secondary | ICD-10-CM

## 2013-05-11 DIAGNOSIS — M545 Low back pain, unspecified: Secondary | ICD-10-CM

## 2013-05-11 DIAGNOSIS — I10 Essential (primary) hypertension: Secondary | ICD-10-CM

## 2013-05-11 DIAGNOSIS — M533 Sacrococcygeal disorders, not elsewhere classified: Secondary | ICD-10-CM

## 2013-05-11 DIAGNOSIS — R42 Dizziness and giddiness: Secondary | ICD-10-CM

## 2013-05-11 DIAGNOSIS — E785 Hyperlipidemia, unspecified: Secondary | ICD-10-CM

## 2013-05-11 NOTE — Progress Notes (Signed)
Passed clock drawing 

## 2013-05-11 NOTE — Progress Notes (Signed)
Patient ID: Troy Ortiz, male   DOB: 11-02-1935, 78 y.o.   MRN: [id number]    Chief Complaint  Patient presents with  . Annual Exam    annual exam   No Known Allergies  HPI 78 y/o male patient is here for physical exam. He has history of HTN, dizziness, hyperlipidemia, anemia among others. He also follows with the [facility] hospital. he still has occassional dizziness with change of position but meclizine has been helpful. He stopped taking his furosemide. uptodate with immunization and colonoscopy.  Review of Systems  Constitutional: Negative for fever, chills, weight loss, malaise/fatigue and diaphoresis.  HENT: Negative for congestion, sore throat. 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 Eyes: Negative for blurred vision, double vision and discharge. wears corrective lenses. Has cataract in both her eyes. Next eye appointment on 05/23/13 Respiratory: Negative for cough, sputum production, shortness of breath and wheezing. denies smoking Cardiovascular: Negative for chest pain, palpitations, orthopnea and leg swelling.  Gastrointestinal: Negative for heartburn, nausea, vomiting, abdominal pain, diarrhea. Last bowel movement this am. Denies melena. Colonoscopy 01/2011 years back where polyps were removed and they were benign.  Genitourinary: Negative for dysuria, urgency, frequency and flank pain.  Musculoskeletal: Negative for falls and myalgias. has degenerative changes in lumbar spine. occassional low back pain Skin: Negative for itching and rash.  Neurological: Negative for tingling, focal weakness and headaches. occassional dizziness with change of position Psychiatric/Behavioral: Negative for depression and memory loss. Sleep cycle has been good.The patient is not nervous/anxious.      Past Medical History  Diagnosis Date  . Cervicalgia   . Lumbago   . Unspecified constipation   . Rash and other nonspecific skin eruption   . Conjunctivitis unspecified   . Unspecified late  effects of cerebrovascular disease   . Anemia, unspecified   . Generalized hyperhidrosis   . Other abnormal glucose   . Neoplasm of uncertain behavior of skin   . Enthesopathy of unspecified site   . Other and unspecified hyperlipidemia   . Unspecified essential hypertension   . Reflux esophagitis   . Dizziness and giddiness   . Allergic rhinitis, cause unspecified   . Arthritis   . Asthma     AS A CHILD  . Stroke     MINI STROKES   Past Surgical History  Procedure Laterality Date  . Colonoscopy     Current Outpatient Prescriptions on File Prior to Visit  Medication Sig Dispense Refill  . amLODipine (NORVASC) 10 MG tablet Take 1 tablet by mouth once daily  30 tablet  3  . aspirin 81 MG tablet Take 1 tablet (81 mg total) by mouth daily.  30 tablet  3  . lisinopril (PRINIVIL,ZESTRIL) 5 MG tablet Take 1 tablet (5 mg total) by mouth daily.  90 tablet  3  . Meclizine HCl 25 MG CHEW Chew 1 tablet (25 mg total) by mouth 2 (two) times daily.  90 each  0  . simvastatin (ZOCOR) 40 MG tablet Take 1 tablet (40 mg total) by mouth at bedtime.  30 tablet  3  . Tdap (BOOSTRIX) 5-2.5-18.5 LF-MCG/0.5 injection Inject 0.5 mLs into the muscle once.  0.5 mL  0   No current facility-administered medications on file prior to visit.    Family History  Problem Relation Age of Onset  . Heart disease Mother    History   Social History  . Marital Status: Married    Spouse Name: N/A    Number of Children:  9  . Years of Education: N/A   Occupational History  . Dealer     retired   Social History Main Topics  . Smoking status: Former Research scientist (life sciences)  . Smokeless tobacco: Never Used     Comment: Quit at age 15  . Alcohol Use: No  . Drug Use: No  . Sexual Activity: Not on file   Other Topics Concern  . Not on file   Social History Narrative  . No narrative on file   Physical exam BP 124/68  Pulse 57  Temp(Src) 97.5 F (36.4 C) (Oral)  Resp 12  Ht 5\' 6"  (1.676 m)  Wt 173 lb 3.2 oz  (78.563 kg)  BMI 27.97 kg/m2  SpO2 98%  General- elderly male in no acute distress Head- atraumatic, normocephalic Eyes- PERRLA, EOMI, no pallor, no icterus Neck- no lymphadenopathy, no thyromegaly, no jugular vein distension, no carotid bruit Chest- no chest wall deformities, no chest wall tenderness Cardiovascular- normal s1,s2, no murmurs/ rubs/ gallops Respiratory- bilateral clear to auscultation, no wheeze, no rhonchi, no crackles Abdomen- bowel sounds present, soft, non tender, no abdominal bruit, no guarding or rigidity, no cva tenderness Musculoskeletal- able to move all 4 extremities, no spinal and paraspinal tenderness, steady gait, no use of assistive device, good dorsalis pedis Neurological- no focal deficit, normal sensation to pinprick and vibration, normal reflexes Psychiatry- alert and oriented to person, place and time, normal mood and affect Refused rectal exam. Mentions he had it done recently at [facility] and was told his prostate was normal in size and he had no hemorrhoids  Labs- CBC    Component Value Date/Time   WBC 4.9 02/08/2013 1143   WBC 7.2 11/28/2008 1512   RBC 4.11* 02/08/2013 1143   RBC 3.44* 11/28/2008 1512   HGB 12.1* 02/08/2013 1143   HCT 35.2* 02/08/2013 1143   PLT 226 11/28/2008 1512   MCV 86 02/08/2013 1143   MCH 29.4 02/08/2013 1143   MCHC 34.4 02/08/2013 1143   MCHC 34.2 11/28/2008 1512   RDW 13.6 02/08/2013 1143   RDW 12.8 11/28/2008 1512   LYMPHSABS 1.9 02/08/2013 1143   LYMPHSABS 1.5 11/28/2008 1512   MONOABS 0.3 11/28/2008 1512   EOSABS 0.2 02/08/2013 1143   EOSABS 0.2 11/28/2008 1512   BASOSABS 0.0 02/08/2013 1143   BASOSABS 0.0 11/28/2008 1512    CMP     Component Value Date/Time   NA 142 02/08/2013 1143   NA 138 11/28/2008 1512   K 4.2 02/08/2013 1143   CL 100 02/08/2013 1143   CO2 23 02/08/2013 1143   GLUCOSE 113* 02/08/2013 1143   GLUCOSE 111* 11/28/2008 1512   BUN 14 02/08/2013 1143   BUN 8 11/28/2008 1512   CREATININE 0.91 02/08/2013 1143   CALCIUM 9.5  02/08/2013 1143   PROT 6.9 02/08/2013 1143   PROT 7.9 08/08/2008 1316   ALBUMIN 4.0 08/08/2008 1316   AST 19 02/08/2013 1143   ALT 12 02/08/2013 1143   ALKPHOS 69 02/08/2013 1143   BILITOT 0.2 02/08/2013 1143   GFRNONAA 82 02/08/2013 1143   GFRAA 95 02/08/2013 1143   Lipid Panel     Component Value Date/Time   CHOL  Value: 150        ATP III CLASSIFICATION:  <200     mg/dL   Desirable  200-239  mg/dL   Borderline High  >=240    mg/dL   High        08/09/2008 0600   TRIG 89 02/08/2013  1143   HDL 50 02/08/2013 1143   HDL 37* 08/09/2008 0600   CHOLHDL 3.8 02/08/2013 1143   CHOLHDL 4.1 08/09/2008 0600   VLDL 30 08/09/2008 0600   LDLCALC 122* 02/08/2013 1143   LDLCALC  Value: 83        Total Cholesterol/HDL:CHD Risk Coronary Heart Disease Risk Table                     Men   Women  1/2 Average Risk   3.4   3.3  Average Risk       5.0   4.4  2 X Average Risk   9.6   7.1  3 X Average Risk  23.4   11.0        Use the calculated Patient Ratio above and the CHD Risk Table to determine the patient's CHD Risk.        ATP III CLASSIFICATION (LDL):  <100     mg/dL   Optimal  100-129  mg/dL   Near or Above                    Optimal  130-159  mg/dL   Borderline  160-189  mg/dL   High  >190     mg/dL   Very High 08/09/2008 0600    Assessment/plan  1. HTN (hypertension) Stable bp readings. Continue norvasc and lisinopril. Stopped lasix. Monitor bmp periodicially. Will review ekg from [place] done last month  2. Hyperlipidemia Continue zocor for now. Monitor lipid panel  3. Dizziness Stable on prn meclizine. Will need to get records from [place] on imaging of head. Reviewed prior MRI brain  4. Back pain, lumbosacral stable for now.  - PSA - Vitamin D, 1,25-dihydroxy  5. Routine general medical examination at a health care facility Dietary and exercise counselling provided. uptodate with immunization and screening exam. Skin care, seat belt use recommended. MMSE 28/30 today

## 2013-05-12 LAB — PSA: PSA: 2.8 ng/mL (ref 0.0–4.0)

## 2013-05-12 LAB — VITAMIN D 1,25 DIHYDROXY: Vit D, 1,25-Dihydroxy: 35.2 pg/mL (ref 19.9–79.3)

## 2013-11-15 ENCOUNTER — Ambulatory Visit: Payer: Self-pay | Admitting: Internal Medicine

## 2013-11-15 ENCOUNTER — Ambulatory Visit: Payer: Commercial Managed Care - HMO | Admitting: Internal Medicine

## 2013-11-15 DIAGNOSIS — Z0289 Encounter for other administrative examinations: Secondary | ICD-10-CM

## 2013-12-13 ENCOUNTER — Encounter: Payer: Self-pay | Admitting: Internal Medicine

## 2013-12-13 ENCOUNTER — Ambulatory Visit (INDEPENDENT_AMBULATORY_CARE_PROVIDER_SITE_OTHER): Payer: Commercial Managed Care - HMO | Admitting: Internal Medicine

## 2013-12-13 VITALS — BP 130/70 | HR 55 | Temp 98.1°F | Ht 66.0 in | Wt 169.4 lb

## 2013-12-13 DIAGNOSIS — I10 Essential (primary) hypertension: Secondary | ICD-10-CM

## 2013-12-13 DIAGNOSIS — E785 Hyperlipidemia, unspecified: Secondary | ICD-10-CM

## 2013-12-13 DIAGNOSIS — H8110 Benign paroxysmal vertigo, unspecified ear: Secondary | ICD-10-CM

## 2013-12-13 DIAGNOSIS — K5901 Slow transit constipation: Secondary | ICD-10-CM | POA: Insufficient documentation

## 2013-12-13 DIAGNOSIS — K5900 Constipation, unspecified: Secondary | ICD-10-CM

## 2013-12-13 MED ORDER — POLYETHYLENE GLYCOL 3350 17 G PO PACK: 17.0000 g | PACK | Freq: Every day | ORAL | Status: DC

## 2013-12-13 MED ORDER — SENNOSIDES-DOCUSATE SODIUM 8.6-50 MG PO TABS: 1.0000 | ORAL_TABLET | Freq: Every day | ORAL | Status: DC

## 2013-12-13 NOTE — Progress Notes (Signed)
Patient ID: Troy Ortiz, male   DOB: March 04, 1935, 78 y.o.   MRN: [id number]    Chief Complaint  Patient presents with  . Acute Visit    Pt. complaining of stomach problems   No Known Allergies  HPI 78 y/o male patient is here for RV. He has history of HTN, dizziness, hyperlipidemia, anemia among others. He also follows with the [facility] hospital. He has not required his meclizine recently. Needs refill on amlodipine and lisinopril. He feels he needs to go to the restroom but unable to evacuate his bowel completely. He has been taking docusate at present bid. Appetite is good. Denies nausea or vomiting Denies melena or rectal bleed. Last bowel movement this am  Review of Systems   Constitutional: Negative for fever, chills, weight loss, malaise/fatigue and diaphoresis.   HENT: Negative for congestion, sore throat. 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 Eyes: Negative for blurred vision, double vision and discharge. wears corrective lenses. Has cataract in both eyes.  Respiratory: Negative for cough, sputum production, shortness of breath and wheezing. denies smoking Cardiovascular: Negative for chest pain, palpitations, orthopnea and leg swelling.   Gastrointestinal: Negative for heartburn, nausea, vomiting, abdominal pain, diarrhea.  Genitourinary: Negative for dysuria, urgency, frequency and flank pain.   Musculoskeletal: Negative for falls and myalgias. has degenerative changes in lumbar spine. occassional low back pain Skin: Negative for itching and rash.   Neurological: Negative for tingling, focal weakness and headaches. occassional dizziness with change of position Psychiatric/Behavioral: Negative for depression and memory loss. Sleep cycle has been good.The patient is not nervous/anxious.    Wt Readings from Last 3 Encounters:  12/13/13 169 lb 6.4 oz (76.839 kg)  05/11/13 173 lb 3.2 oz (78.563 kg)  02/08/13 171 lb (77.565 kg)   Past Medical History  Diagnosis Date  .  Cervicalgia   . Lumbago   . Unspecified constipation   . Rash and other nonspecific skin eruption   . Conjunctivitis unspecified   . Unspecified late effects of cerebrovascular disease   . Anemia, unspecified   . Generalized hyperhidrosis   . Other abnormal glucose   . Neoplasm of uncertain behavior of skin   . Enthesopathy of unspecified site   . Other and unspecified hyperlipidemia   . Unspecified essential hypertension   . Reflux esophagitis   . Dizziness and giddiness   . Allergic rhinitis, cause unspecified   . Arthritis   . Asthma     AS A CHILD  . Stroke     MINI STROKES   Current Outpatient Prescriptions on File Prior to Visit  Medication Sig Dispense Refill  . amLODipine (NORVASC) 10 MG tablet Take 1 tablet by mouth once daily 30 tablet 3  . aspirin 81 MG tablet Take 1 tablet (81 mg total) by mouth daily. 30 tablet 3  . lisinopril (PRINIVIL,ZESTRIL) 5 MG tablet Take 1 tablet (5 mg total) by mouth daily. 90 tablet 3  . Meclizine HCl 25 MG CHEW Chew 1 tablet (25 mg total) by mouth 2 (two) times daily. 90 each 0  . simvastatin (ZOCOR) 40 MG tablet Take 1 tablet (40 mg total) by mouth at bedtime. 30 tablet 3  . Tdap (BOOSTRIX) 5-2.5-18.5 LF-MCG/0.5 injection Inject 0.5 mLs into the muscle once. 0.5 mL 0   No current facility-administered medications on file prior to visit.    Physical exam BP 130/70 mmHg  Pulse 55  Temp(Src) 98.1 F (36.7 C) (Oral)  Ht 5\' 6"  (1.676 m)  Wt  169 lb 6.4 oz (76.839 kg)  BMI 27.35 kg/m2  SpO2 98%  General- elderly male in no acute distress Head- atraumatic, normocephalic Eyes- PERRLA, EOMI, no pallor, no icterus Neck- no lymphadenopathy, no thyromegaly, no jugular vein distension, no carotid bruit Chest- no chest wall deformities, no chest wall tenderness Cardiovascular- normal s1,s2, no murmurs/ rubs/ gallops Respiratory- bilateral clear to auscultation, no wheeze, no rhonchi, no crackles Abdomen- bowel sounds present, soft, non  tender, no abdominal bruit, no guarding or rigidity, no cva tenderness Musculoskeletal- able to move all 4 extremities, no spinal and paraspinal tenderness, steady gait, no use of assistive device, good dorsalis pedis Neurological- no focal deficit Psychiatry- alert and oriented to person, place and time, normal mood and affect  Labs- CBC Latest Ref Rng 02/08/2013 11/28/2008 08/08/2008  WBC 3.4 - 10.8 x10E3/uL 4.9 7.2 5.6  Hemoglobin 12.6 - 17.7 g/dL 12.1(L) 10.6(L) 12.0(L)  Hematocrit 37.5 - 51.0 % 35.2(L) 31.0(L) 35.6(L)  Platelets 150 - 400 K/uL - 226 181   CMP     Component Value Date/Time   NA 142 02/08/2013 1143   NA 138 11/28/2008 1512   K 4.2 02/08/2013 1143   CL 100 02/08/2013 1143   CO2 23 02/08/2013 1143   GLUCOSE 113* 02/08/2013 1143   GLUCOSE 111* 11/28/2008 1512   BUN 14 02/08/2013 1143   BUN 8 11/28/2008 1512   CREATININE 0.91 02/08/2013 1143   CALCIUM 9.5 02/08/2013 1143   PROT 6.9 02/08/2013 1143   PROT 7.9 08/08/2008 1316   ALBUMIN 4.0 08/08/2008 1316   AST 19 02/08/2013 1143   ALT 12 02/08/2013 1143   ALKPHOS 69 02/08/2013 1143   BILITOT 0.2 02/08/2013 1143   GFRNONAA 82 02/08/2013 1143   GFRAA 95 02/08/2013 1143   Lipid Panel     Component Value Date/Time   CHOL  08/09/2008 0600    150        ATP III CLASSIFICATION:  <200     mg/dL   Desirable  200-239  mg/dL   Borderline High  >=240    mg/dL   High          TRIG 89 02/08/2013 1143   HDL 50 02/08/2013 1143   HDL 37* 08/09/2008 0600   CHOLHDL 3.8 02/08/2013 1143   CHOLHDL 4.1 08/09/2008 0600   VLDL 30 08/09/2008 0600   LDLCALC 122* 02/08/2013 1143   LDLCALC  08/09/2008 0600    83        Total Cholesterol/HDL:CHD Risk Coronary Heart Disease Risk Table                     Men   Women  1/2 Average Risk   3.4   3.3  Average Risk       5.0   4.4  2 X Average Risk   9.6   7.1  3 X Average Risk  23.4   11.0        Use the calculated Patient Ratio above and the CHD Risk Table to determine the  patient's CHD Risk.        ATP III CLASSIFICATION (LDL):  <100     mg/dL   Optimal  100-129  mg/dL   Near or Above                    Optimal  130-159  mg/dL   Borderline  160-189  mg/dL   High  >190     mg/dL  Very High    Assessment/plan  1. Essential hypertension bp stable, continue amlodipine and lisinopril. Monitor bp. Refill provided. Continue aspirin - CBC (no diff); Future - CMP; Future  2. Hyperlipidemia Stable, continue simvastatin - Lipid Panel; Future  3. Benign paroxysmal positional vertigo, unspecified laterality Stable, on prn meclizine, slow position change recommended  4. Constipation, unspecified constipation type Add senna s and miralax daily, reassess, encouraged hdyration  Will need flu vaccine, pt mentions running late today, to come in a week for lab and flu vaccine appointment

## 2013-12-23 ENCOUNTER — Ambulatory Visit (INDEPENDENT_AMBULATORY_CARE_PROVIDER_SITE_OTHER): Payer: Commercial Managed Care - HMO | Admitting: *Deleted

## 2013-12-23 ENCOUNTER — Other Ambulatory Visit: Payer: Commercial Managed Care - HMO

## 2013-12-23 ENCOUNTER — Ambulatory Visit: Payer: Commercial Managed Care - HMO

## 2013-12-23 DIAGNOSIS — Z23 Encounter for immunization: Secondary | ICD-10-CM

## 2013-12-23 DIAGNOSIS — I10 Essential (primary) hypertension: Secondary | ICD-10-CM

## 2013-12-23 DIAGNOSIS — E785 Hyperlipidemia, unspecified: Secondary | ICD-10-CM

## 2013-12-24 LAB — COMPREHENSIVE METABOLIC PANEL
ALT: 8 IU/L (ref 0–44)
AST: 19 IU/L (ref 0–40)
Albumin/Globulin Ratio: 1.6 (ref 1.1–2.5)
Albumin: 4.2 g/dL (ref 3.5–4.8)
Alkaline Phosphatase: 70 IU/L (ref 39–117)
BILIRUBIN TOTAL: 0.3 mg/dL (ref 0.0–1.2)
BUN/Creatinine Ratio: 19 (ref 10–22)
BUN: 14 mg/dL (ref 8–27)
CALCIUM: 9.3 mg/dL (ref 8.6–10.2)
CHLORIDE: 103 mmol/L (ref 97–108)
CO2: 26 mmol/L (ref 18–29)
Creatinine, Ser: 0.74 mg/dL — ABNORMAL LOW (ref 0.76–1.27)
GFR, EST AFRICAN AMERICAN: 104 mL/min/{1.73_m2} (ref >59)
GFR, EST NON AFRICAN AMERICAN: 90 mL/min/{1.73_m2} (ref >59)
Globulin, Total: 2.7 g/dL (ref 1.5–4.5)
Glucose: 111 mg/dL — ABNORMAL HIGH (ref 65–99)
POTASSIUM: 3.9 mmol/L (ref 3.5–5.2)
SODIUM: 144 mmol/L (ref 134–144)
Total Protein: 6.9 g/dL (ref 6.0–8.5)

## 2013-12-24 LAB — LIPID PANEL
Chol/HDL Ratio: 4.3 ratio units (ref 0.0–5.0)
Cholesterol, Total: 257 mg/dL — ABNORMAL HIGH (ref 100–199)
HDL: 60 mg/dL (ref >39)
LDL Calculated: 178 mg/dL — ABNORMAL HIGH (ref 0–99)
Triglycerides: 97 mg/dL (ref 0–149)
VLDL Cholesterol Cal: 19 mg/dL (ref 5–40)

## 2013-12-24 LAB — CBC
HEMATOCRIT: 36.8 % — AB (ref 37.5–51.0)
Hemoglobin: 12.5 g/dL — ABNORMAL LOW (ref 12.6–17.7)
MCH: 29.2 pg (ref 26.6–33.0)
MCHC: 34 g/dL (ref 31.5–35.7)
MCV: 86 fL (ref 79–97)
Platelets: 261 10*3/uL (ref 150–379)
RBC: 4.28 x10E6/uL (ref 4.14–5.80)
RDW: 14.1 % (ref 12.3–15.4)
WBC: 5.5 10*3/uL (ref 3.4–10.8)

## 2013-12-28 ENCOUNTER — Encounter: Payer: Self-pay | Admitting: *Deleted

## 2014-03-21 ENCOUNTER — Encounter: Payer: Self-pay | Admitting: Internal Medicine

## 2014-03-21 ENCOUNTER — Ambulatory Visit (INDEPENDENT_AMBULATORY_CARE_PROVIDER_SITE_OTHER): Payer: Medicare Other | Admitting: Internal Medicine

## 2014-03-21 VITALS — BP 164/96 | HR 56 | Temp 98.3°F | Ht 66.0 in | Wt 172.2 lb

## 2014-03-21 DIAGNOSIS — Z9114 Patient's other noncompliance with medication regimen: Secondary | ICD-10-CM | POA: Diagnosis not present

## 2014-03-21 DIAGNOSIS — H8110 Benign paroxysmal vertigo, unspecified ear: Secondary | ICD-10-CM | POA: Insufficient documentation

## 2014-03-21 DIAGNOSIS — I10 Essential (primary) hypertension: Secondary | ICD-10-CM | POA: Insufficient documentation

## 2014-03-21 DIAGNOSIS — H8113 Benign paroxysmal vertigo, bilateral: Secondary | ICD-10-CM

## 2014-03-21 DIAGNOSIS — K5901 Slow transit constipation: Secondary | ICD-10-CM | POA: Diagnosis not present

## 2014-03-21 MED ORDER — POLYETHYLENE GLYCOL 3350 17 G PO PACK: 17.0000 g | PACK | Freq: Every day | ORAL | Status: DC

## 2014-03-21 MED ORDER — LISINOPRIL 5 MG PO TABS: ORAL_TABLET | ORAL | Status: DC

## 2014-03-21 NOTE — Progress Notes (Signed)
Patient ID: Troy Ortiz, male   DOB: 11-13-1935, 79 y.o.   MRN: [id number]    Chief Complaint  Patient presents with  . Medical Management of Chronic Issues    3 Month Follow up. Complains of Lightheaded at times.   No Known Allergies  HPI 79 y/o male patient is here for routine visit. He complaints of being dizzy at times, has not taken his meclizine and has hx of BPV. He has history of HTN and bp elevated in office this am Has not been taking his lisinopril for a month- ran out of it Taking his norvasc Continues to have constipation- has not been taking his miralax. Feels bloated and uneasy if has not had a bowel movement for 2-3 days.  Appetite is good. Denies nausea or vomiting Denies melena or rectal bleed. Last bowel movement this am  Review of Systems   Constitutional: Negative for fever, chills, weight loss HENT: Negative for congestion, sore throat. 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 Eyes: Negative for blurred vision, double vision and discharge. wears corrective lenses. Has cataract in both eyes.   Respiratory: Negative for cough, sputum production, shortness of breath and wheezing. denies smoking Cardiovascular: Negative for chest pain, palpitations, orthopnea and leg swelling.   Gastrointestinal: Negative for heartburn, nausea, vomiting, abdominal pain, diarrhea.   Genitourinary: Negative for dysuria, urgency, frequency and flank pain.   Musculoskeletal: Negative for falls and myalgias. has degenerative changes in lumbar spine. occassional low back pain Skin: Negative for itching and rash.   Neurological: Negative for tingling, focal weakness and headaches. occassional dizziness with change of position Psychiatric/Behavioral: Negative for depression and memory loss. Sleep cycle has been good.The patient is not nervous/anxious.      Past Medical History  Diagnosis Date  . Cervicalgia   . Lumbago   . Unspecified constipation   . Rash and other nonspecific  skin eruption   . Conjunctivitis unspecified   . Unspecified late effects of cerebrovascular disease   . Anemia, unspecified   . Generalized hyperhidrosis   . Other abnormal glucose   . Neoplasm of uncertain behavior of skin   . Enthesopathy of unspecified site   . Other and unspecified hyperlipidemia   . Unspecified essential hypertension   . Reflux esophagitis   . Dizziness and giddiness   . Allergic rhinitis, cause unspecified   . Arthritis   . Asthma     AS A CHILD  . Stroke     MINI STROKES   Current Outpatient Prescriptions on File Prior to Visit  Medication Sig Dispense Refill  . amLODipine (NORVASC) 10 MG tablet Take 1 tablet by mouth once daily 30 tablet 3  . aspirin 81 MG tablet Take 1 tablet (81 mg total) by mouth daily. 30 tablet 3  . Meclizine HCl 25 MG CHEW Chew 1 tablet (25 mg total) by mouth 2 (two) times daily. 90 each 0  . polyethylene glycol (MIRALAX) packet Take 17 g by mouth daily. 14 each 3  . senna-docusate (SENOKOT-S) 8.6-50 MG per tablet Take 1 tablet by mouth daily. 30 tablet 3  . simvastatin (ZOCOR) 40 MG tablet Take 1 tablet (40 mg total) by mouth at bedtime. 30 tablet 3  . Tdap (BOOSTRIX) 5-2.5-18.5 LF-MCG/0.5 injection Inject 0.5 mLs into the muscle once. 0.5 mL 0   No current facility-administered medications on file prior to visit.   Family History  Problem Relation Age of Onset  . Heart disease Mother    Physical exam  BP 164/96 mmHg  Pulse 56  Temp(Src) 98.3 F (36.8 C) (Oral)  Ht 5\' 6"  (1.676 m)  Wt 172 lb 3.2 oz (78.109 kg)  BMI 27.81 kg/m2  SpO2 99%  Wt Readings from Last 3 Encounters:  03/21/14 172 lb 3.2 oz (78.109 kg)  12/13/13 169 lb 6.4 oz (76.839 kg)  05/11/13 173 lb 3.2 oz (78.563 kg)   Manual check HR 60/min Repeat bp check Left arm 152/88, right arm 164/96  General- elderly male in no acute distress Head- atraumatic, normocephalic Eyes- PERRLA, EOMI, no pallor, no icterus Neck- no lymphadenopathy, no thyromegaly, no  jugular vein distension, no carotid bruit Chest- no chest wall deformities, no chest wall tenderness Cardiovascular- normal s1,s2, no murmurs/ rubs/ gallops, good radial pulses Respiratory- bilateral clear to auscultation, no wheeze, no rhonchi, no crackles Abdomen- bowel sounds present, soft, non tender, no guarding or rigidity, no cva tenderness Musculoskeletal- able to move all 4 extremities, no spinal and paraspinal tenderness, steady gait, no use of assistive device, good dorsalis pedis Neurological- no focal deficit Psychiatry- alert and oriented to person, place and time, normal mood and affect  labs  CBC Latest Ref Rng 12/23/2013 02/08/2013 11/28/2008  WBC 3.4 - 10.8 x10E3/uL 5.5 4.9 7.2  Hemoglobin 12.6 - 17.7 g/dL 12.5(L) 12.1(L) 10.6(L)  Hematocrit 37.5 - 51.0 % 36.8(L) 35.2(L) 31.0(L)  Platelets 150 - 379 x10E3/uL 261 - 226   CMP     Component Value Date/Time   NA 144 12/23/2013 0906   NA 138 11/28/2008 1512   K 3.9 12/23/2013 0906   CL 103 12/23/2013 0906   CO2 26 12/23/2013 0906   GLUCOSE 111* 12/23/2013 0906   GLUCOSE 111* 11/28/2008 1512   BUN 14 12/23/2013 0906   BUN 8 11/28/2008 1512   CREATININE 0.74* 12/23/2013 0906   CALCIUM 9.3 12/23/2013 0906   PROT 6.9 12/23/2013 0906   PROT 7.9 08/08/2008 1316   ALBUMIN 4.0 08/08/2008 1316   AST 19 12/23/2013 0906   ALT 8 12/23/2013 0906   ALKPHOS 70 12/23/2013 0906   BILITOT 0.3 12/23/2013 0906   GFRNONAA 90 12/23/2013 0906   GFRAA 104 12/23/2013 0906   01/31/11 colonoscopy- 2 polyp removed, repeat in 5 years  Assessment/plan  1. Essential hypertension, benign Elevated bp, patient has not been taking his medication as prescribed. Continue norvasc 10 mg daily and restart lisinopril 5 mg daily, monitor bp at home with goal bp < 140/90  2. BPV (benign positional vertigo), bilateral Patient educated about his BPV, advised to take meclizine to help with his dizziness  3. Non compliance w medication  regimen Reinforced importance of taking the medication, harms from not taking the medication  4. Slow transit constipation Samples of miralax provided. Continue senokot s. Script of miralax provided as well

## 2014-03-21 NOTE — Patient Instructions (Signed)
Your blood pressure goal is less than 140/90  Important not to miss your medications

## 2014-03-28 ENCOUNTER — Encounter: Payer: Self-pay | Admitting: Internal Medicine

## 2014-03-30 DIAGNOSIS — H40033 Anatomical narrow angle, bilateral: Secondary | ICD-10-CM | POA: Diagnosis not present

## 2014-03-30 DIAGNOSIS — H2513 Age-related nuclear cataract, bilateral: Secondary | ICD-10-CM | POA: Diagnosis not present

## 2014-05-16 ENCOUNTER — Encounter (HOSPITAL_COMMUNITY): Payer: Self-pay | Admitting: Emergency Medicine

## 2014-05-16 ENCOUNTER — Emergency Department (HOSPITAL_COMMUNITY)
Admission: EM | Admit: 2014-05-16 | Discharge: 2014-05-16 | Disposition: A | Discharge status: Home / Self Care | Payer: Medicare Other | Attending: Emergency Medicine | Admitting: Emergency Medicine

## 2014-05-16 ENCOUNTER — Emergency Department (HOSPITAL_COMMUNITY): Payer: Medicare Other

## 2014-05-16 ENCOUNTER — Telehealth: Payer: Self-pay

## 2014-05-16 DIAGNOSIS — J45909 Unspecified asthma, uncomplicated: Secondary | ICD-10-CM | POA: Insufficient documentation

## 2014-05-16 DIAGNOSIS — Z7982 Long term (current) use of aspirin: Secondary | ICD-10-CM | POA: Diagnosis not present

## 2014-05-16 DIAGNOSIS — N432 Other hydrocele: Secondary | ICD-10-CM | POA: Diagnosis not present

## 2014-05-16 DIAGNOSIS — Z79899 Other long term (current) drug therapy: Secondary | ICD-10-CM | POA: Diagnosis not present

## 2014-05-16 DIAGNOSIS — R1031 Right lower quadrant pain: Secondary | ICD-10-CM

## 2014-05-16 DIAGNOSIS — Z8673 Personal history of transient ischemic attack (TIA), and cerebral infarction without residual deficits: Secondary | ICD-10-CM | POA: Insufficient documentation

## 2014-05-16 DIAGNOSIS — Z8669 Personal history of other diseases of the nervous system and sense organs: Secondary | ICD-10-CM | POA: Diagnosis not present

## 2014-05-16 DIAGNOSIS — K219 Gastro-esophageal reflux disease without esophagitis: Secondary | ICD-10-CM | POA: Insufficient documentation

## 2014-05-16 DIAGNOSIS — N433 Hydrocele, unspecified: Secondary | ICD-10-CM | POA: Diagnosis not present

## 2014-05-16 DIAGNOSIS — Z8739 Personal history of other diseases of the musculoskeletal system and connective tissue: Secondary | ICD-10-CM | POA: Diagnosis not present

## 2014-05-16 DIAGNOSIS — Z87891 Personal history of nicotine dependence: Secondary | ICD-10-CM | POA: Diagnosis not present

## 2014-05-16 DIAGNOSIS — E785 Hyperlipidemia, unspecified: Secondary | ICD-10-CM | POA: Insufficient documentation

## 2014-05-16 DIAGNOSIS — R1030 Lower abdominal pain, unspecified: Secondary | ICD-10-CM | POA: Diagnosis not present

## 2014-05-16 DIAGNOSIS — Z862 Personal history of diseases of the blood and blood-forming organs and certain disorders involving the immune mechanism: Secondary | ICD-10-CM | POA: Diagnosis not present

## 2014-05-16 MED ORDER — TRAMADOL HCL 50 MG PO TABS: 50.0000 mg | ORAL_TABLET | Freq: Four times a day (QID) | ORAL | Status: DC | PRN

## 2014-05-16 MED ORDER — FENTANYL CITRATE 0.05 MG/ML IJ SOLN
50.0000 ug | Freq: Once | INTRAMUSCULAR | Status: AC
Start: 2014-05-16 — End: 2014-05-16
  Administered 2014-05-16: 50 ug via INTRAVENOUS
  Filled 2014-05-16: qty 2

## 2014-05-16 NOTE — Telephone Encounter (Signed)
Noted  

## 2014-05-16 NOTE — Telephone Encounter (Signed)
Patient called requesting an appointment for pain in groin. Patient states pain onset Sunday, pain interrupts sleep and is very intense. Patient not aware of any injury that would of onset pain. Patient was offered appointment for tomorrow with Dr.Carter @ 4:00 pm.   Patient states he can not wait until tomorrow and will go to Urgent Care today if he can not be see in office today  I will forward message to Dr.Green (Office doctor for today). Please advise if patient can be seen- no openings on schedule.

## 2014-05-16 NOTE — Telephone Encounter (Signed)
Left message on voicemail informing patient to return call if he is available to come into the office today at 12:45 pm to be see at 1:00 pm, If he is not already seeking medical care at Urgent Care.

## 2014-05-16 NOTE — ED Notes (Addendum)
Pt reports R sided groin pain intermittent started Sunday; not as painful when walking around. Most painful when he lies down at night; no urinary complaints. Denies urinary symptoms. PT put "muscle rub" on it and didn't help. Denies heavy lifting.

## 2014-05-16 NOTE — ED Provider Notes (Signed)
CSN: [id number]     Arrival date & time 05/16/14  1028 History   First MD Initiated Contact with Patient 05/16/14 1443     Chief Complaint  Patient presents with  . Groin Pain     (Consider location/radiation/quality/duration/timing/severity/associated sxs/prior Treatment) HPI Comments: Patient is a 79 year old male past medical history significant for hypertension, hyperlipidemia, arthritis, history of CVA presenting to the emergency department for right-sided groin pain that started Sunday evening. He states some degenerative day he was laying sleep Tylenol his kitchen. When he went to bed and laying down he noticed some throbbing pain in his right groin area without radiation. He states his is been mild to moderate. Worsen the evening. Not precipitated with ambulating or movement or exertion. His stride topical pain reliever and Tylenol with little to no improvement. Denies any fevers, vomiting, diarrhea, urinary symptoms, testicular or penile pain/swelling.  Patient is a 79 y.o. male presenting with groin pain.  Groin Pain    Past Medical History  Diagnosis Date  . Cervicalgia   . Lumbago   . Unspecified constipation   . Rash and other nonspecific skin eruption   . Conjunctivitis unspecified   . Unspecified late effects of cerebrovascular disease   . Anemia, unspecified   . Generalized hyperhidrosis   . Other abnormal glucose   . Neoplasm of uncertain behavior of skin   . Enthesopathy of unspecified site   . Other and unspecified hyperlipidemia   . Unspecified essential hypertension   . Reflux esophagitis   . Dizziness and giddiness   . Allergic rhinitis, cause unspecified   . Arthritis   . Asthma     AS A CHILD  . Stroke     MINI STROKES   Past Surgical History  Procedure Laterality Date  . Colonoscopy     Family History  Problem Relation Age of Onset  . Heart disease Mother    History  Substance Use Topics  . Smoking status: Former Research scientist (life sciences)  . Smokeless  tobacco: Never Used     Comment: Quit at age 82  . Alcohol Use: No    Review of Systems  Genitourinary:       R groin pain  All other systems reviewed and are negative.     Allergies  Review of patient's allergies indicates no known allergies.  Home Medications   Prior to Admission medications   Medication Sig Start Date End Date Taking? Authorizing Provider  amLODipine (NORVASC) 10 MG tablet Take 1 tablet by mouth once daily 02/08/13  Yes Mahima Pandey, MD  aspirin 81 MG tablet Take 1 tablet (81 mg total) by mouth daily. 02/08/13  Yes Mahima Bubba Camp, MD  lisinopril (PRINIVIL,ZESTRIL) 5 MG tablet Take one tablet by mouth once daily for blood pressure 03/21/14  Yes Mahima Pandey, MD  Meclizine HCl 25 MG CHEW Chew 1 tablet (25 mg total) by mouth 2 (two) times daily. 02/08/13  Yes Mahima Pandey, MD  polyethylene glycol (MIRALAX) packet Take 17 g by mouth daily. 03/21/14  Yes Mahima Pandey, MD  senna-docusate (SENOKOT-S) 8.6-50 MG per tablet Take 1 tablet by mouth daily. 12/13/13  Yes Mahima Bubba Camp, MD  simvastatin (ZOCOR) 40 MG tablet Take 1 tablet (40 mg total) by mouth at bedtime. 02/08/13  Yes Mahima Bubba Camp, MD  Tdap (BOOSTRIX) 5-2.5-18.5 LF-MCG/0.5 injection Inject 0.5 mLs into the muscle once. 02/08/13   Blanchie Serve, MD  traMADol (ULTRAM) 50 MG tablet Take 1 tablet (50 mg total) by mouth every 6 (six) hours as  needed. 05/16/14   [place] Behlke, PA-C   BP 150/60 mmHg  Pulse 46  Temp(Src) 98 F (36.7 C) (Oral)  Resp 18  Ht 5\' 8"  (1.727 m)  Wt 171 lb (77.565 kg)  BMI 26.01 kg/m2  SpO2 97% Physical Exam  Constitutional: He is oriented to person, place, and time. He appears well-developed and well-nourished. No distress.  HENT:  Head: Normocephalic and atraumatic.  Right Ear: External ear normal.  Left Ear: External ear normal.  Nose: Nose normal.  Mouth/Throat: Oropharynx is clear and moist.  Eyes: Conjunctivae are normal.  Neck: Normal range of motion. Neck supple.  No  nuchal rigidity.   Cardiovascular: Normal rate, regular rhythm and normal heart sounds.   Pulmonary/Chest: Effort normal and breath sounds normal. No respiratory distress.  Abdominal: Soft. He exhibits no distension. There is no tenderness. There is no rebound and no guarding. Hernia confirmed negative in the right inguinal area and confirmed negative in the left inguinal area.  Genitourinary: Penis normal. Right testis shows tenderness. Right testis shows no mass and no swelling. Right testis is descended. Left testis shows no mass, no swelling and no tenderness. Left testis is descended.  Musculoskeletal: Normal range of motion.       Right hip: Normal.       Left hip: Normal.       Lumbar back: Normal.       Right upper leg: Normal.       Left upper leg: Normal.  Lymphadenopathy:       Right: No inguinal adenopathy present.       Left: No inguinal adenopathy present.  Neurological: He is alert and oriented to person, place, and time.  Skin: Skin is warm and dry. He is not diaphoretic.  Psychiatric: He has a normal mood and affect.  Nursing note and vitals reviewed.   ED Course  Procedures (including critical care time) Medications  fentaNYL (SUBLIMAZE) injection 50 mcg (50 mcg Intravenous Given 05/16/14 1621)    Labs Review Labs Reviewed - No data to display  Imaging Review US Scrotum  05/16/2014   CLINICAL DATA:  79 year old male with right side groin pain since Sunday. Initial encounter.  EXAM: ULTRASOUND OF SCROTUM  TECHNIQUE: Complete ultrasound examination of the testicles, epididymis, and other scrotal structures was performed.  COMPARISON:  None.  FINDINGS: Right testicle  Measurements: 4.0 x 1.8 x 3.1 cm. Small circumscribed 3-4 mm hypoechoic area, probably a cyst (image 15) with no internal vascularity. Echotexture otherwise normal.  Left testicle  Measurements: 4.5 x 2.2 x 2.7 cm. No mass or microlithiasis visualized.  Right epididymis:  Normal in size and appearance.  Left  epididymis:  Normal in size and appearance.  Hydrocele: Moderate size right and small left hydroceles with low level internal echoes.  Varicocele:  None visualized.  IMPRESSION: No evidence of testicular torsion. Negative except for right greater than left hydroceles.   Electronically Signed   By: Genevie Ann M.D.   On: 05/16/2014 16:09   [place] Art/ven Flow Abd Pelv Doppler  05/16/2014   CLINICAL DATA:  79 year old male with right side groin pain since Sunday. Initial encounter.  EXAM: ULTRASOUND OF SCROTUM  TECHNIQUE: Complete ultrasound examination of the testicles, epididymis, and other scrotal structures was performed.  COMPARISON:  None.  FINDINGS: Right testicle  Measurements: 4.0 x 1.8 x 3.1 cm. Small circumscribed 3-4 mm hypoechoic area, probably a cyst (image 15) with no internal vascularity. Echotexture otherwise normal.  Left testicle  Measurements: 4.5  x 2.2 x 2.7 cm. No mass or microlithiasis visualized.  Right epididymis:  Normal in size and appearance.  Left epididymis:  Normal in size and appearance.  Hydrocele: Moderate size right and small left hydroceles with low level internal echoes.  Varicocele:  None visualized.  IMPRESSION: No evidence of testicular torsion. Negative except for right greater than left hydroceles.   Electronically Signed   By: Genevie Ann M.D.   On: 05/16/2014 16:09     EKG Interpretation None      MDM   Final diagnoses:  Groin pain, right  Right hydrocele    Filed Vitals:   05/16/14 1644  BP: 150/60  Pulse: 46  Temp:   Resp: 18    Afebrile, NAD, non-toxic appearing, AAOx4.   GU examination reveals no hernia or inguinal adenopathy appreciated. R teste is tender to palpation with swelling, erythema or warmth. L teste and penis is wnl on examination. R hip is without acute finding on PE. US obtained shows evidence of R hydrocele without torsion or other acute finding. Pain managed in ED. Will d/c with pain medications and advised urology follow up. Return  precautions discussed. Patient is agreeable to plan. Patient is stable at time of discharge Patient d/w with Dr. Venora Maples, agrees with plan.    Baron Sane, PA-C 05/16/14 [place], MD 05/16/14 1737

## 2014-05-16 NOTE — ED Notes (Signed)
Pt stable, ambulatory, denies any pain, states understanding of discharge instructions 

## 2014-05-16 NOTE — Telephone Encounter (Signed)
Patient called back, he is currently in the emergency room

## 2014-05-16 NOTE — Discharge Instructions (Signed)
Please follow up with your primary care physician in 1-2 days. If you do not have one please call the [place] number listed above. Please take pain medication and/or muscle relaxants as prescribed and as needed for pain. Please do not drive on narcotic pain medication or on muscle relaxants. Please follow up with Dr. Gaynelle Arabian to schedule a follow up appointment. Please read all discharge instructions and return precautions.   Hydrocele, Adult Fluid can collect around the testicles. This fluid forms in a sac. This condition is called a hydrocele. The collected fluid causes swelling of the scrotum. Usually, it affects just one testicle. Most of the time, the condition does not cause pain. Sometimes, the hydrocele goes away on its own. Other times, surgery is needed to get rid of the fluid. CAUSES A hydrocele does not develop often. Different things can cause a hydrocele in a man, including:  Injury to the scrotum.  Infection.  X-ray of the area around the scrotum.  A tumor or cancer of the testicle.  Twisting of a testicle.  Decreased blood flow to the scrotum. SYMPTOMS   Swelling without pain. The hydrocele feels like a water-filled balloon.  Swelling with pain. This can occur if the hydrocele was caused by infection or twisting.  Mild discomfort in the scrotum.  The hydrocele may feel heavy.  Swelling that gets smaller when you lie down. DIAGNOSIS  Your caregiver will do a physical exam to decide if you have a hydrocele. This may include:  Asking questions about your overall health, today and in the past. Your caregiver may ask about any injuries, X-rays, or infections.  Pushing on your abdomen or asking you to change positions to see if the size of the hydrocele changes.  Shining a light through the scrotum (transillumination) to see if the fluid inside the scrotum is clear.  Blood tests and urine tests to check for infection.  Imaging studies that  take pictures of the scrotum and testicles. TREATMENT  Treatment depends in part on what caused the condition. Options include:  Watchful waiting. Your caregiver checks the hydrocele every so often.  Different surgeries to drain the fluid.  A needle may be put into the scrotum to drain fluid (needle aspiration). Fluid often returns after this type of treatment.  A cut (incision) may be made in the scrotum to remove the fluid sac (hydrocelectomy).  An incision may be made in the groin to repair a hydrocele that has contact with abdominal fluids (communicating hydrocele).  Medicines to treat an infection (antibiotics). HOME CARE INSTRUCTIONS  What you need to do at home may depend on the cause of the hydrocele and type of treatment. In general:  Take all medicine as directed by your caregiver. Follow the directions carefully.  Ask your caregiver if there is anything you should not do while you recover (activities, lifting, work, sex).  If you had surgery to repair a communicating hydrocele, recovery time may vary. Ask you caregiver about your recovery time.  Avoid heavy lifting for 4 to 6 weeks.  If you had an incision on the scrotum or groin, wash it for 2 to 3 days after surgery. Do this as long as the skin is closed and there are no gaps in the wound. Wash gently, and avoid rubbing the incision.  Keep all follow-up appointments. SEEK MEDICAL CARE IF:   Your scrotum seems to be getting larger.  The area becomes more and more uncomfortable. [place]  IF:  You have a fever. Document Released: 07/10/2009 Document Revised: 11/10/2012 Document Reviewed: 07/10/2009 [facility] Patient Information 2015 [place], [place]. This information is not intended to replace advice given to you by your health care provider. Make sure you discuss any questions you have with your health care provider.

## 2014-05-16 NOTE — Telephone Encounter (Signed)
Okay to add patient at 1 PM.

## 2014-05-18 ENCOUNTER — Ambulatory Visit (INDEPENDENT_AMBULATORY_CARE_PROVIDER_SITE_OTHER): Payer: Medicare Other | Admitting: Internal Medicine

## 2014-05-18 ENCOUNTER — Encounter: Payer: Self-pay | Admitting: Internal Medicine

## 2014-05-18 VITALS — BP 130/78 | HR 57 | Temp 97.9°F | Resp 18 | Ht 68.0 in | Wt 168.2 lb

## 2014-05-18 DIAGNOSIS — N508 Other specified disorders of male genital organs: Secondary | ICD-10-CM

## 2014-05-18 DIAGNOSIS — N433 Hydrocele, unspecified: Secondary | ICD-10-CM | POA: Diagnosis not present

## 2014-05-18 DIAGNOSIS — N442 Benign cyst of testis: Secondary | ICD-10-CM

## 2014-05-18 MED ORDER — AMLODIPINE BESYLATE 10 MG PO TABS: ORAL_TABLET | ORAL | Status: DC

## 2014-05-18 MED ORDER — SENNOSIDES-DOCUSATE SODIUM 8.6-50 MG PO TABS: 1.0000 | ORAL_TABLET | Freq: Every day | ORAL | Status: DC

## 2014-05-18 MED ORDER — MECLIZINE HCL 25 MG PO CHEW: 1.0000 | CHEWABLE_TABLET | Freq: Two times a day (BID) | ORAL | Status: DC

## 2014-05-18 NOTE — Progress Notes (Signed)
Patient ID: Troy Ortiz, male   DOB: 1936-01-04, 79 y.o.   MRN: [id number]   Location:  [facility] / [place]   No Known Allergies  Chief Complaint  Patient presents with  . Hospitalization Follow-up    HPI: Patient is a 79 y.o.  Black male seen in the office today for f/u on his ED visit.  He had gone in with right groin pain that began over the weekend.  Had put new floor down in kitchen.  Was getting up and down a lot.  He took tylenol, used muscle rub.  He denied any swelling.    Found to have bilateral cysts in testicles and hydrocele on right when had ultrasound.  He was given pain medication in ED (fentanyl injection) and sent home with 14 tramadol which has been effective for pain.  Has only used 2 tablets so far.  He was frightened by the idea of having the fluid removed with a needle as was mentioned in the ED, but agrees to see urology so I will place a referral for this.  Pain is much better now and pt thinks it was due to his getting up and down frequently when working in the kitchen.    Review of Systems:  Review of Systems  Constitutional: Negative for fever and chills.  Respiratory: Negative for shortness of breath.   Cardiovascular: Negative for chest pain.  Gastrointestinal: Negative for abdominal pain.  Genitourinary: Negative for dysuria, urgency, frequency and hematuria.       Denies swelling of scrotum, pain much better since tramadol     Past Medical History  Diagnosis Date  . Cervicalgia   . Lumbago   . Unspecified constipation   . Rash and other nonspecific skin eruption   . Conjunctivitis unspecified   . Unspecified late effects of cerebrovascular disease   . Anemia, unspecified   . Generalized hyperhidrosis   . Other abnormal glucose   . Neoplasm of uncertain behavior of skin   . Enthesopathy of unspecified site   . Other and unspecified hyperlipidemia   . Unspecified essential hypertension   . Reflux esophagitis     . Dizziness and giddiness   . Allergic rhinitis, cause unspecified   . Arthritis   . Asthma     AS A CHILD  . Stroke     MINI STROKES    Past Surgical History  Procedure Laterality Date  . Colonoscopy      Social History:   reports that he has quit smoking. He has never used smokeless tobacco. He reports that he does not drink alcohol or use illicit drugs.  Family History  Problem Relation Age of Onset  . Heart disease Mother     Medications: Patient's Medications  New Prescriptions   No medications on file  Previous Medications   AMLODIPINE (NORVASC) 10 MG TABLET    Take 1 tablet by mouth once daily   ASPIRIN 81 MG TABLET    Take 1 tablet (81 mg total) by mouth daily.   LISINOPRIL (PRINIVIL,ZESTRIL) 5 MG TABLET    Take one tablet by mouth once daily for blood pressure   MECLIZINE HCL 25 MG CHEW    Chew 1 tablet (25 mg total) by mouth 2 (two) times daily.   POLYETHYLENE GLYCOL (MIRALAX) PACKET    Take 17 g by mouth daily.   SENNA-DOCUSATE (SENOKOT-S) 8.6-50 MG PER TABLET    Take 1 tablet by mouth daily.   SIMVASTATIN (ZOCOR)  40 MG TABLET    Take 1 tablet (40 mg total) by mouth at bedtime.   TDAP (BOOSTRIX) 5-2.5-18.5 LF-MCG/0.5 INJECTION    Inject 0.5 mLs into the muscle once.   TRAMADOL (ULTRAM) 50 MG TABLET    Take 1 tablet (50 mg total) by mouth every 6 (six) hours as needed.  Modified Medications   No medications on file  Discontinued Medications   No medications on file     Physical Exam: Filed Vitals:   05/18/14 0958 05/18/14 1006  BP: 150/100 130/78  Pulse: 57   Temp: 97.9 F (36.6 C)   TempSrc: Oral   Resp: 18   Height: 5\' 8"  (1.727 m)   Weight: 168 lb 3.2 oz (76.295 kg)   SpO2: 97%   Physical Exam  Constitutional: He appears well-developed and well-nourished. No distress.  Cardiovascular: Normal rate, regular rhythm and normal heart sounds.   Pulmonary/Chest: Effort normal and breath sounds normal.  Abdominal: Soft. Bowel sounds are normal.   Genitourinary:  Refused scrotal exam    Labs reviewed: Basic Metabolic Panel:  Recent Labs  12/23/13 0906  NA 144  K 3.9  CL 103  CO2 26  GLUCOSE 111*  BUN 14  CREATININE 0.74*  CALCIUM 9.3   Liver Function Tests:  Recent Labs  12/23/13 0906  AST 19  ALT 8  ALKPHOS 70  BILITOT 0.3  PROT 6.9   No results for input(s): LIPASE, AMYLASE in the last 8760 hours. No results for input(s): AMMONIA in the last 8760 hours. CBC:  Recent Labs  12/23/13 0906  WBC 5.5  HGB 12.5*  HCT 36.8*  MCV 86  PLT 261   Lipid Panel:  Recent Labs  12/23/13 0906  CHOL 257*  HDL 60  LDLCALC 178*  TRIG 97  CHOLHDL 4.3   Lab Results  Component Value Date   HGBA1C * 08/08/2008    6.2 (NOTE) The ADA recommends the following therapeutic goal for glycemic control related to Hgb A1c measurement: Goal of therapy: <6.5 Hgb A1c  Reference: American Diabetes Association: Clinical Practice Recommendations 2010, Diabetes Care, 2010, 33: (Suppl  1).   [place] 05/16/14:  Right testicular cyst, left testicular cyst, epididymal cyst, right hydrocele  Assessment/Plan 1. Right hydrocele - new onset right groin pain 5 days ago and identified on [place] in ED--pain mgt successful with tramadol  - Ambulatory referral to Urology placed--ED paperwork lists Dr. Gaynelle Arabian so referred to him  2. Testicular cyst -bilaterally noted on [place] at ED also - Ambulatory referral to Urology   Labs/tests ordered:  Urology referral Next appt: keep June appt for physical with Dr. Nelly Rout L. Princella Jaskiewicz, D.O. [facility] [address], [place] [address] Cell Phone (Mon-Fri 8am-5pm):  [phone] On Call:  [phone] & follow prompts after 5pm & weekends Office Phone:  [phone] Office Fax:  [phone]

## 2014-06-30 ENCOUNTER — Encounter: Payer: Self-pay | Admitting: Internal Medicine

## 2014-06-30 ENCOUNTER — Ambulatory Visit (INDEPENDENT_AMBULATORY_CARE_PROVIDER_SITE_OTHER): Payer: Medicare Other | Admitting: Internal Medicine

## 2014-06-30 VITALS — BP 130/68 | HR 64 | Temp 98.3°F | Resp 20 | Ht 67.0 in | Wt 162.6 lb

## 2014-06-30 DIAGNOSIS — J301 Allergic rhinitis due to pollen: Secondary | ICD-10-CM | POA: Diagnosis not present

## 2014-06-30 DIAGNOSIS — J209 Acute bronchitis, unspecified: Secondary | ICD-10-CM

## 2014-06-30 MED ORDER — ALBUTEROL SULFATE HFA 108 (90 BASE) MCG/ACT IN AERS: INHALATION_SPRAY | RESPIRATORY_TRACT | Status: DC

## 2014-06-30 MED ORDER — METHYLPREDNISOLONE 4 MG PO TBPK: ORAL_TABLET | ORAL | Status: DC

## 2014-06-30 MED ORDER — BENZONATATE 100 MG PO CAPS: 200.0000 mg | ORAL_CAPSULE | Freq: Three times a day (TID) | ORAL | Status: DC | PRN

## 2014-06-30 MED ORDER — AZITHROMYCIN 250 MG PO TABS: ORAL_TABLET | ORAL | Status: DC

## 2014-06-30 NOTE — Patient Instructions (Addendum)
Push fluids and rest  Recommend OTC claritin, zyrtec or allegra daily as needed for seasonal allergy  Take all medications as ordered  Follow up if symptoms do not improve.

## 2014-06-30 NOTE — Progress Notes (Signed)
Patient ID: Troy Ortiz, male   DOB: May 29, 1935, 79 y.o.   MRN: [id number]    Location:    [facility]   Place of Service:   OFFICE   Chief Complaint  Patient presents with  . Acute Visit    Patient c/o Cough started up about 2 weeks ago, color is milky    HPI:  79 yo male seen today for 2 week hx productive cough with thick sputum pdtn. Tried mucinex and benadryl which helps make cough productive. He feels SOB when mucous thick. No HA, dizziness, CP/tightness, post nasal drip, sore throat, sleep disturbance. (+) sinus pressure, rhinorrhea, reduced appetite. He is a past smoker. He has a hx asthma and TIAs. BP stable on meds  Past Medical History  Diagnosis Date  . Cervicalgia   . Lumbago   . Unspecified constipation   . Rash and other nonspecific skin eruption   . Conjunctivitis unspecified   . Unspecified late effects of cerebrovascular disease   . Anemia, unspecified   . Generalized hyperhidrosis   . Other abnormal glucose   . Neoplasm of uncertain behavior of skin   . Enthesopathy of unspecified site   . Other and unspecified hyperlipidemia   . Unspecified essential hypertension   . Reflux esophagitis   . Dizziness and giddiness   . Allergic rhinitis, cause unspecified   . Arthritis   . Asthma     AS A CHILD  . Stroke     MINI STROKES    Past Surgical History  Procedure Laterality Date  . Colonoscopy      Patient Care Team: Gildardo Cranker, DO as PCP - General (Internal Medicine)  History   Social History  . Marital Status: Married    Spouse Name: N/A  . Number of Children: 12  . Years of Education: N/A   Occupational History  . Dealer     retired   Social History Main Topics  . Smoking status: Former Research scientist (life sciences)  . Smokeless tobacco: Never Used     Comment: Quit at age 79  . Alcohol Use: No  . Drug Use: No  . Sexual Activity: Not on file   Other Topics Concern  . Not on file   Social History Narrative     reports that he has quit smoking. He has  never used smokeless tobacco. He reports that he does not drink alcohol or use illicit drugs.  No Known Allergies  Medications: Patient's Medications  New Prescriptions   No medications on file  Previous Medications   AMLODIPINE (NORVASC) 10 MG TABLET    Take 1 tablet by mouth once daily   ASPIRIN 81 MG TABLET    Take 1 tablet (81 mg total) by mouth daily.   LISINOPRIL (PRINIVIL,ZESTRIL) 5 MG TABLET    Take one tablet by mouth once daily for blood pressure   MECLIZINE HCL 25 MG CHEW    Chew 1 tablet (25 mg total) by mouth 2 (two) times daily.   POLYETHYLENE GLYCOL (MIRALAX) PACKET    Take 17 g by mouth daily.   SENNA-DOCUSATE (SENOKOT-S) 8.6-50 MG PER TABLET    Take 1 tablet by mouth daily.   SIMVASTATIN (ZOCOR) 40 MG TABLET    Take 1 tablet (40 mg total) by mouth at bedtime.   TDAP (BOOSTRIX) 5-2.5-18.5 LF-MCG/0.5 INJECTION    Inject 0.5 mLs into the muscle once.   TRAMADOL (ULTRAM) 50 MG TABLET    Take 1 tablet (50 mg total) by mouth every 6 (  six) hours as needed.  Modified Medications   No medications on file  Discontinued Medications   No medications on file    Review of Systems  Constitutional: Positive for appetite change.  HENT: Positive for rhinorrhea and sinus pressure.   Respiratory: Positive for cough and shortness of breath.   All other systems reviewed and are negative.   Filed Vitals:   06/30/14 1635  BP: 130/68  Pulse: 64  Temp: 98.3 F (36.8 C)  TempSrc: Oral  Resp: 20  Height: 5\' 7"  (1.702 m)  Weight: 162 lb 9.6 oz (73.755 kg)  SpO2: 97%   Body mass index is 25.46 kg/(m^2).  Physical Exam  Constitutional: He appears well-developed and well-nourished. No distress.  looks ill in NAD. No conversational dyspnea  HENT:  Nose: Mucosal edema (with grey turbinates) and rhinorrhea present. Right sinus exhibits no maxillary sinus tenderness and no frontal sinus tenderness. Left sinus exhibits no maxillary sinus tenderness and no frontal sinus tenderness.    Mouth/Throat: Mucous membranes are normal. No uvula swelling. No oropharyngeal exudate or tonsillar abscesses.    TMs dull b/l but intact and no redness.  Neck: Neck supple. No tracheal deviation present.  Cardiovascular: Normal rate, regular rhythm, normal heart sounds and intact distal pulses.  Exam reveals no gallop and no friction rub.   No murmur heard. No LE edema b/l. No calf TTP  Pulmonary/Chest: Effort normal. No stridor. No respiratory distress. He has wheezes (end expiratory with prolonged expiratory phase). He has no rales.  Lymphadenopathy:    He has cervical adenopathy (NT).  Skin: Skin is warm and dry. No rash noted.  Psychiatric: He has a normal mood and affect. His behavior is normal. Thought content normal.     Labs reviewed: No visits with results within 3 Month(s) from this visit. Latest known visit with results is:  Appointment on 12/23/2013  Component Date Value Ref Range Status  . WBC 12/23/2013 5.5  3.4 - 10.8 x10E3/uL Final  . RBC 12/23/2013 4.28  4.14 - 5.80 x10E6/uL Final  . Hemoglobin 12/23/2013 12.5* 12.6 - 17.7 g/dL Final  . HCT 12/23/2013 36.8* 37.5 - 51.0 % Final  . MCV 12/23/2013 86  79 - 97 fL Final  . MCH 12/23/2013 29.2  26.6 - 33.0 pg Final  . MCHC 12/23/2013 34.0  31.5 - 35.7 g/dL Final  . RDW 12/23/2013 14.1  12.3 - 15.4 % Final  . Platelets 12/23/2013 261  150 - 379 x10E3/uL Final  . Glucose 12/23/2013 111* 65 - 99 mg/dL Final  . BUN 12/23/2013 14  8 - 27 mg/dL Final  . Creatinine, Ser 12/23/2013 0.74* 0.76 - 1.27 mg/dL Final  . GFR calc non Af Amer 12/23/2013 90  >59 mL/min/1.73 Final  . GFR calc Af Amer 12/23/2013 104  >59 mL/min/1.73 Final  . BUN/Creatinine Ratio 12/23/2013 19  10 - 22 Final  . Sodium 12/23/2013 144  134 - 144 mmol/L Final  . Potassium 12/23/2013 3.9  3.5 - 5.2 mmol/L Final  . Chloride 12/23/2013 103  97 - 108 mmol/L Final  . CO2 12/23/2013 26  18 - 29 mmol/L Final  . Calcium 12/23/2013 9.3  8.6 - 10.2 mg/dL Final   . Total Protein 12/23/2013 6.9  6.0 - 8.5 g/dL Final  . Albumin 12/23/2013 4.2  3.5 - 4.8 g/dL Final  . Globulin, Total 12/23/2013 2.7  1.5 - 4.5 g/dL Final  . Albumin/Globulin Ratio 12/23/2013 1.6  1.1 - 2.5 Final  . Total Bilirubin  12/23/2013 0.3  0.0 - 1.2 mg/dL Final  . Alkaline Phosphatase 12/23/2013 70  39 - 117 IU/L Final  . AST 12/23/2013 19  0 - 40 IU/L Final  . ALT 12/23/2013 8  0 - 44 IU/L Final  . Cholesterol, Total 12/23/2013 257* 100 - 199 mg/dL Final  . Triglycerides 12/23/2013 97  0 - 149 mg/dL Final  . HDL 12/23/2013 60  >39 mg/dL Final   Comment: According to ATP-III Guidelines, HDL-C >59 mg/dL is considered a negative risk factor for CHD.   Marland Kitchen VLDL Cholesterol Cal 12/23/2013 19  5 - 40 mg/dL Final  . LDL Calculated 12/23/2013 178* 0 - 99 mg/dL Final  . Chol/HDL Ratio 12/23/2013 4.3  0.0 - 5.0 ratio units Final   Comment:                                   T. Chol/HDL Ratio                                             Men  Women                               1/2 Avg.Risk  3.4    3.3                                   Avg.Risk  5.0    4.4                                2X Avg.Risk  9.6    7.1                                3X Avg.Risk 23.4   11.0     No results found.   Assessment/Plan   ICD-9-CM ICD-10-CM   1. Acute bronchitis, unspecified organism - he is a past smoker. Rx Zpak as directed, medrol dosepak, tapering HFA 466.0 J20.9   2. Allergic rhinitis due to pollen 477.0 J30.1     --Push fluids and rest  --Recommend OTC claritin, zyrtec or allegra daily as needed for seasonal allergy  --Take all medications as ordered  --Follow up if symptoms do not improve. Keep appt as scheduled  Hitomi Slape S. Perlie Gold  [facility] [address] [place], [place] [address] [phone] Cell (Monday-Friday 8 AM - 5 PM) [phone] After 5 PM and follow prompts

## 2014-07-25 ENCOUNTER — Encounter: Payer: Self-pay | Admitting: Internal Medicine

## 2014-07-28 ENCOUNTER — Ambulatory Visit (INDEPENDENT_AMBULATORY_CARE_PROVIDER_SITE_OTHER): Payer: Medicare Other | Admitting: Internal Medicine

## 2014-07-28 ENCOUNTER — Encounter: Payer: Self-pay | Admitting: Internal Medicine

## 2014-07-28 VITALS — BP 128/74 | HR 57 | Temp 97.4°F | Resp 20 | Ht 67.0 in | Wt 167.4 lb

## 2014-07-28 DIAGNOSIS — Z0000 Encounter for general adult medical examination without abnormal findings: Secondary | ICD-10-CM | POA: Diagnosis not present

## 2014-07-28 DIAGNOSIS — I10 Essential (primary) hypertension: Secondary | ICD-10-CM | POA: Diagnosis not present

## 2014-07-28 DIAGNOSIS — R9431 Abnormal electrocardiogram [ECG] [EKG]: Secondary | ICD-10-CM

## 2014-07-28 MED ORDER — TRAMADOL HCL 50 MG PO TABS: 50.0000 mg | ORAL_TABLET | Freq: Four times a day (QID) | ORAL | Status: DC | PRN

## 2014-07-28 MED ORDER — SENNOSIDES-DOCUSATE SODIUM 8.6-50 MG PO TABS: 1.0000 | ORAL_TABLET | Freq: Every day | ORAL | Status: DC

## 2014-07-28 MED ORDER — BENZONATATE 100 MG PO CAPS: 200.0000 mg | ORAL_CAPSULE | Freq: Three times a day (TID) | ORAL | Status: DC | PRN

## 2014-07-28 NOTE — Progress Notes (Signed)
Patient ID: Troy Ortiz, male   DOB: 07/27/1935, 79 y.o.   MRN: [id number] Subjective:     Troy Ortiz is a 79 y.o. male and is here for a comprehensive physical exam. The patient reports problems - back pain. bronchitis sx's resolved.   He continues to receive care at the [facility] at least annually  History   Past Medical History  Diagnosis Date  . Cervicalgia   . Lumbago   . Unspecified constipation   . Rash and other nonspecific skin eruption   . Conjunctivitis unspecified   . Unspecified late effects of cerebrovascular disease   . Anemia, unspecified   . Generalized hyperhidrosis   . Other abnormal glucose   . Neoplasm of uncertain behavior of skin   . Enthesopathy of unspecified site   . Other and unspecified hyperlipidemia   . Unspecified essential hypertension   . Reflux esophagitis   . Dizziness and giddiness   . Allergic rhinitis, cause unspecified   . Arthritis   . Asthma     AS A CHILD  . Stroke     MINI STROKES   Past Surgical History  Procedure Laterality Date  . Colonoscopy     Family History  Problem Relation Age of Onset  . Heart disease Mother     Social History  . Marital Status: Married    Spouse Name: N/A  . Number of Children: 25  . Years of Education: N/A   Occupational History  . Dealer     retired   Social History Main Topics  . Smoking status: Former Research scientist (life sciences)  . Smokeless tobacco: Never Used     Comment: Quit at age 89  . Alcohol Use: No  . Drug Use: No  . Sexual Activity: Not on file   Other Topics Concern  . Not on file   Social History Narrative   Health Maintenance  Topic Date Due  . Samul Dada  11/05/1956  . ZOSTAVAX  11/05/1997  . PNA vac Low Risk Adult (1 of 2 - PCV13) 11/06/2002  . INFLUENZA VACCINE  09/04/2014  . COLONOSCOPY  01/31/2016    Review of Systems    Review of Systems  Constitutional: Negative for fever, chills and malaise/fatigue.  HENT: Positive for congestion (sinus pressure). Negative  for sore throat and tinnitus.   Eyes: Negative for blurred vision and double vision.  Respiratory: Negative for cough, shortness of breath and wheezing.   Cardiovascular: Negative for chest pain, palpitations, orthopnea and leg swelling.  Gastrointestinal: Negative for heartburn, nausea, vomiting, abdominal pain, diarrhea, constipation and blood in stool.  Genitourinary: Negative for dysuria, urgency, frequency and hematuria.  Musculoskeletal: Positive for back pain. Negative for myalgias, joint pain and falls.  Skin: Negative for rash.  Neurological: Positive for dizziness (this AM but resolved after vinegar/salt mixture). Negative for tingling, tremors, sensory change, focal weakness, seizures, loss of consciousness, weakness and headaches.  Endo/Heme/Allergies: Negative for environmental allergies. Does not bruise/bleed easily.  Psychiatric/Behavioral: Negative for depression and memory loss. The patient is not nervous/anxious and does not have insomnia.      Objective:  Physical Exam  Constitutional: He is oriented to person, place, and time and well-developed, well-nourished, and in no distress.  HENT:  Head: Normocephalic and atraumatic.  Right Ear: Hearing, external ear and ear canal normal.  Left Ear: Hearing, external ear and ear canal normal.  Nose: Right sinus exhibits no maxillary sinus tenderness. Left sinus exhibits no maxillary sinus tenderness.  Mouth/Throat: Uvula is midline, oropharynx  is clear and moist and mucous membranes are normal.  L>R TM dull nonbulging. No redness. Intact TMs. Boggy tissue texture changes at maxillary sinus  Eyes: Conjunctivae, EOM and lids are normal. Right eye exhibits no discharge. No scleral icterus.  Neck: Trachea normal. Neck supple. Carotid bruit is not present. No tracheal deviation present. No thyroid mass and no thyromegaly present.  Cardiovascular: Regular rhythm, normal heart sounds and intact distal pulses.  Bradycardia present.  Exam  reveals no gallop and no friction rub.   No murmur heard. No LE edema b/l. No calf TTP  Pulmonary/Chest: Effort normal and breath sounds normal. No stridor. No respiratory distress. He has no wheezes. He has no rhonchi. He has no rales. He exhibits no mass, no tenderness and no crepitus. Right breast exhibits no inverted nipple, no mass, no nipple discharge, no skin change and no tenderness. Left breast exhibits no inverted nipple, no mass, no nipple discharge, no skin change and no tenderness. Breasts are symmetrical.  Abdominal: Soft. Normal appearance, normal aorta and bowel sounds are normal. He exhibits no abdominal bruit, no ascites, no pulsatile midline mass and no mass. There is no hepatosplenomegaly. There is no tenderness. There is no rebound. No hernia.  Genitourinary: Testes/scrotum normal.  Refused exam  Musculoskeletal:       Left shoulder: He exhibits decreased range of motion, tenderness, swelling, crepitus and spasm. He exhibits no deformity and normal strength.       Lumbar back: He exhibits decreased range of motion, tenderness, swelling and spasm. He exhibits no deformity.       Back:  Lymphadenopathy:       Head (right side): No submandibular and no posterior auricular adenopathy present.       Head (left side): No submandibular and no posterior auricular adenopathy present.    He has no cervical adenopathy.       Right: No supraclavicular adenopathy present.       Left: No supraclavicular adenopathy present.  Neurological: He is alert and oriented to person, place, and time. He has normal motor skills, normal strength, normal reflexes and intact cranial nerves. Gait normal.  Skin: Skin is warm, dry and intact. No rash noted.  Psychiatric: Mood, memory, affect and judgment normal.  MMSE score 28/30   MMSE - Mini Mental State Exam 07/28/2014 05/11/2013  Orientation to time 4 5  Orientation to Place 5 4  Registration 3 3  Attention/ Calculation 5 5  Recall 2 2  Language-  name 2 objects 2 2  Language- repeat 1 1  Language- follow 3 step command 3 3  Language- read & follow direction 1 1  Write a sentence 1 1  Copy design 1 1  Total score 28 28       ECG reviewed by myself and d/w pt:  SB with borderline 1st degree AVB @ 48 bpm, nml axis, ST changes V2-V5, LVH, Tflat inf and lat leads. No other ECG available to compare  Assessment:    Healthy male exam.       ICD-9-CM ICD-10-CM   1. Well adult exam V70.0 Z00.00 CMP     Lipid Panel     TSH     PSA     Urinalysis with Reflex Microscopic  2. Abnormal ECG 794.31 R94.31 Ambulatory referral to Cardiology  3. Essential hypertension, benign - stable 401.1 I10 EKG 12-Lead     TSH     Urinalysis with Reflex Microscopic      Plan:  See After Visit Summary for Counseling Recommendations   -Pt is UTD on health maintenance. Vaccinations are UTD. Pt maintains a healthy lifestyle. Encouraged pt to exercise 30-45 minutes 4-5 times per week. Eat a well balanced diet. Avoid smoking. Limit alcohol intake. Wear seatbelt when riding in the car. Wear sun block (SPF >50) when spending extended times outside.  --Will call with cardiology referral. Of note, he states he had some studies done at the [place] but does not remember which ones. He has never seen a cardiologist. He denies anginal pain  --Continue other medications as ordered  --Follow up in 6 mos for routine visit. Fasting labs in 1-2 weeks  [place] S. Perlie Gold  [facility] and [facility] [address] [place], [place] [address] [phone] Cell (Monday-Friday 8 AM - 5 PM) [phone] After 5 PM and follow prompts

## 2014-07-28 NOTE — Progress Notes (Signed)
Passed clock test 

## 2014-07-28 NOTE — Patient Instructions (Addendum)
Will call with cardiology referral  Continue other medications as ordered  Encouraged him to exercise 30-45 minutes 4-5 times per week. Eat a well balanced diet. Avoid smoking. Limit alcohol intake. Wear seatbelt when riding in the car. Wear sun block (SPF >50) when spending extended times outside.  Follow up in 6 mos for routine visit. Fasting labs in 1-2 weeks

## 2014-08-15 ENCOUNTER — Ambulatory Visit (INDEPENDENT_AMBULATORY_CARE_PROVIDER_SITE_OTHER): Payer: Medicare Other | Admitting: Nurse Practitioner

## 2014-08-15 ENCOUNTER — Encounter: Payer: Self-pay | Admitting: Nurse Practitioner

## 2014-08-15 VITALS — BP 136/80 | HR 58 | Temp 97.9°F | Resp 20 | Ht 67.0 in | Wt 168.0 lb

## 2014-08-15 DIAGNOSIS — J329 Chronic sinusitis, unspecified: Secondary | ICD-10-CM

## 2014-08-15 DIAGNOSIS — H8110 Benign paroxysmal vertigo, unspecified ear: Secondary | ICD-10-CM

## 2014-08-15 MED ORDER — ALBUTEROL SULFATE HFA 108 (90 BASE) MCG/ACT IN AERS: INHALATION_SPRAY | RESPIRATORY_TRACT | Status: DC

## 2014-08-15 NOTE — Progress Notes (Signed)
Patient ID: Troy Ortiz, male   DOB: 1936/01/23, 79 y.o.   MRN: [id number]    PCP: Gildardo Cranker, DO  No Known Allergies  Chief Complaint  Patient presents with  . Acute Visit    Sinus infection,head feels heavy      HPI: Patient is a 79 y.o. male seen in the office today because his head feels full for about 1 week. Head feels heavy and congestion. Worse on left side. Dull headache behind left eye. Nose slightly is stopped up. No cough or sore throat Has not taken any medication except decongestant pill, unsure of the name.  Hx of sinus infection.  Hx of dizziness, some feeling of light headedness yesterday but this improved.  Drinking and eating well Trying to drink more water No fevers Hx of smoking Had bronchitis 2 months ago, took zpac and claritin which helped Review of Systems:  Review of Systems  Constitutional: Negative for activity change, appetite change, fatigue and unexpected weight change.  HENT: Positive for congestion and sinus pressure (on left side). Negative for dental problem, ear discharge, ear pain, hearing loss, nosebleeds, postnasal drip, rhinorrhea and trouble swallowing.   Eyes: Negative.   Respiratory: Negative for cough and shortness of breath.   Cardiovascular: Negative for chest pain, palpitations and leg swelling.  Neurological: Positive for dizziness (at times, hx of).    Past Medical History  Diagnosis Date  . Cervicalgia   . Lumbago   . Unspecified constipation   . Rash and other nonspecific skin eruption   . Conjunctivitis unspecified   . Unspecified late effects of cerebrovascular disease   . Anemia, unspecified   . Generalized hyperhidrosis   . Other abnormal glucose   . Neoplasm of uncertain behavior of skin   . Enthesopathy of unspecified site   . Other and unspecified hyperlipidemia   . Unspecified essential hypertension   . Reflux esophagitis   . Dizziness and giddiness   . Allergic rhinitis, cause unspecified   .  Arthritis   . Asthma     AS A CHILD  . Stroke     MINI STROKES   Past Surgical History  Procedure Laterality Date  . Colonoscopy     Social History:   reports that he has quit smoking. He has never used smokeless tobacco. He reports that he does not drink alcohol or use illicit drugs.  Family History  Problem Relation Age of Onset  . Heart disease Mother     Medications: Patient's Medications  New Prescriptions   No medications on file  Previous Medications   ALBUTEROL (PROVENTIL HFA;VENTOLIN HFA) 108 (90 BASE) MCG/ACT INHALER    2 puffs TID x 7 days then BID x 7 days then daily x 7 days and stop   AMLODIPINE (NORVASC) 10 MG TABLET    Take 1 tablet by mouth once daily   ASPIRIN 81 MG TABLET    Take 1 tablet (81 mg total) by mouth daily.   BENZONATATE (TESSALON) 100 MG CAPSULE    Take 2 capsules (200 mg total) by mouth 3 (three) times daily as needed for cough.   LISINOPRIL (PRINIVIL,ZESTRIL) 5 MG TABLET    Take one tablet by mouth once daily for blood pressure   MECLIZINE HCL 25 MG CHEW    Chew 1 tablet (25 mg total) by mouth 2 (two) times daily.   METHYLPREDNISOLONE (MEDROL DOSEPAK) 4 MG TBPK TABLET    Take as directed   POLYETHYLENE GLYCOL (MIRALAX) PACKET  Take 17 g by mouth daily.   SENNA-DOCUSATE (SENOKOT-S) 8.6-50 MG PER TABLET    Take 1 tablet by mouth daily.   SIMVASTATIN (ZOCOR) 40 MG TABLET    Take 1 tablet (40 mg total) by mouth at bedtime.   TDAP (BOOSTRIX) 5-2.5-18.5 LF-MCG/0.5 INJECTION    Inject 0.5 mLs into the muscle once.   TRAMADOL (ULTRAM) 50 MG TABLET    Take 1 tablet (50 mg total) by mouth every 6 (six) hours as needed.  Modified Medications   No medications on file  Discontinued Medications   No medications on file     Physical Exam:  Filed Vitals:   08/15/14 1613  BP: 136/80  Pulse: 58  Temp: 97.9 F (36.6 C)  TempSrc: Oral  Resp: 20  Height: 5\' 7"  (1.702 m)  Weight: 168 lb (76.204 kg)  SpO2: 94%    Physical Exam  Constitutional: He  is oriented to person, place, and time. He appears well-developed and well-nourished. No distress.  HENT:  Head: Normocephalic and atraumatic.  Right Ear: External ear normal.  Left Ear: External ear normal.  Nose: Nose normal.  Mouth/Throat: Oropharynx is clear and moist. No oropharyngeal exudate.  Eyes: Conjunctivae and EOM are normal. Pupils are equal, round, and reactive to light.  Neck: Normal range of motion. Neck supple.  Cardiovascular: Normal rate, regular rhythm and normal heart sounds.   Pulmonary/Chest: Effort normal and breath sounds normal.  Abdominal: Bowel sounds are normal.  Neurological: He is alert and oriented to person, place, and time.  Skin: Skin is warm and dry. He is not diaphoretic.  Psychiatric: He has a normal mood and affect.    Labs reviewed: Basic Metabolic Panel:  Recent Labs  12/23/13 0906  NA 144  K 3.9  CL 103  CO2 26  GLUCOSE 111*  BUN 14  CREATININE 0.74*  CALCIUM 9.3   Liver Function Tests:  Recent Labs  12/23/13 0906  AST 19  ALT 8  ALKPHOS 70  BILITOT 0.3  PROT 6.9   No results for input(s): LIPASE, AMYLASE in the last 8760 hours. No results for input(s): AMMONIA in the last 8760 hours. CBC:  Recent Labs  12/23/13 0906  WBC 5.5  HGB 12.5*  HCT 36.8*  MCV 86  PLT 261   Lipid Panel:  Recent Labs  12/23/13 0906  CHOL 257*  HDL 60  LDLCALC 178*  TRIG 97  CHOLHDL 4.3   TSH: No results for input(s): TSH in the last 8760 hours. A1C: Lab Results  Component Value Date   HGBA1C * 08/08/2008    6.2 (NOTE) The ADA recommends the following therapeutic goal for glycemic control related to Hgb A1c measurement: Goal of therapy: <6.5 Hgb A1c  Reference: American Diabetes Association: Clinical Practice Recommendations 2010, Diabetes Care, 2010, 33: (Suppl  1).     Assessment/Plan 1. Sinusitis, unspecified chronicity, unspecified location Mild symptoms at this time Supportive care - to use nettipot twice daily and  plain nasal saline spray as needed throughout the day -to start Claritin 10 mg daily -discussed return precautions, feeling worse, fatigue or fever, if worsening of symptoms occur- pt understands  2. BPPV Stable, Cont meclizine PRN  Jessica K. Harle Battiest  [facility] [phone] 8 am - 5 pm) [phone] (after hours)

## 2014-08-15 NOTE — Patient Instructions (Signed)
Take Claritin 10 mg daily Plain nasal spray use in both nare as needed throughout the day  Use netti pot twice daily  Let us know if symptoms fail to improve or worsen Sinusitis Sinusitis is redness, soreness, and inflammation of the paranasal sinuses. Paranasal sinuses are air pockets within the bones of your face (beneath the eyes, the middle of the forehead, or above the eyes). In healthy paranasal sinuses, mucus is able to drain out, and air is able to circulate through them by way of your nose. However, when your paranasal sinuses are inflamed, mucus and air can become trapped. This can allow bacteria and other germs to grow and cause infection. Sinusitis can develop quickly and last only a short time (acute) or continue over a long period (chronic). Sinusitis that lasts for more than 12 weeks is considered chronic.  CAUSES  Causes of sinusitis include:  Allergies.  Structural abnormalities, such as displacement of the cartilage that separates your nostrils (deviated septum), which can decrease the air flow through your nose and sinuses and affect sinus drainage.  Functional abnormalities, such as when the small hairs (cilia) that line your sinuses and help remove mucus do not work properly or are not present. SIGNS AND SYMPTOMS  Symptoms of acute and chronic sinusitis are the same. The primary symptoms are pain and pressure around the affected sinuses. Other symptoms include:  Upper toothache.  Earache.  Headache.  Bad breath.  Decreased sense of smell and taste.  A cough, which worsens when you are lying flat.  Fatigue.  Fever.  Thick drainage from your nose, which often is green and may contain pus (purulent).  Swelling and warmth over the affected sinuses. DIAGNOSIS  Your health care provider will perform a physical exam. During the exam, your health care provider may:  Look in your nose for signs of abnormal growths in your nostrils (nasal polyps).  Tap over the  affected sinus to check for signs of infection.  View the inside of your sinuses (endoscopy) using an imaging device that has a light attached (endoscope). If your health care provider suspects that you have chronic sinusitis, one or more of the following tests may be recommended:  Allergy tests.  Nasal culture. A sample of mucus is taken from your nose, sent to a lab, and screened for bacteria.  Nasal cytology. A sample of mucus is taken from your nose and examined by your health care provider to determine if your sinusitis is related to an allergy. TREATMENT  Most cases of acute sinusitis are related to a viral infection and will resolve on their own within 10 days. Sometimes medicines are prescribed to help relieve symptoms (pain medicine, decongestants, nasal steroid sprays, or saline sprays).  However, for sinusitis related to a bacterial infection, your health care provider will prescribe antibiotic medicines. These are medicines that will help kill the bacteria causing the infection.  Rarely, sinusitis is caused by a fungal infection. In theses cases, your health care provider will prescribe antifungal medicine. For some cases of chronic sinusitis, surgery is needed. Generally, these are cases in which sinusitis recurs more than 3 times per year, despite other treatments. HOME CARE INSTRUCTIONS   Drink plenty of water. Water helps thin the mucus so your sinuses can drain more easily.  Use a humidifier.  Inhale steam 3 to 4 times a day (for example, sit in the bathroom with the shower running).  Apply a warm, moist washcloth to your face 3 to 4 times  a day, or as directed by your health care provider.  Use saline nasal sprays to help moisten and clean your sinuses.  Take medicines only as directed by your health care provider.  If you were prescribed either an antibiotic or antifungal medicine, finish it all even if you start to feel better. SEEK IMMEDIATE MEDICAL CARE IF:  You  have increasing pain or severe headaches.  You have nausea, vomiting, or drowsiness.  You have swelling around your face.  You have vision problems.  You have a stiff neck.  You have difficulty breathing. MAKE SURE YOU:   Understand these instructions.  Will watch your condition.  Will get help right away if you are not doing well or get worse. Document Released: 01/20/2005 Document Revised: 06/06/2013 Document Reviewed: 02/04/2011 [facility] Patient Information 2015 [place], [place]. This information is not intended to replace advice given to you by your health care provider. Make sure you discuss any questions you have with your health care provider.

## 2014-08-29 DIAGNOSIS — H25013 Cortical age-related cataract, bilateral: Secondary | ICD-10-CM | POA: Diagnosis not present

## 2014-08-29 DIAGNOSIS — H524 Presbyopia: Secondary | ICD-10-CM | POA: Diagnosis not present

## 2014-08-29 DIAGNOSIS — H52223 Regular astigmatism, bilateral: Secondary | ICD-10-CM | POA: Diagnosis not present

## 2014-08-29 DIAGNOSIS — H5203 Hypermetropia, bilateral: Secondary | ICD-10-CM | POA: Diagnosis not present

## 2014-10-05 ENCOUNTER — Ambulatory Visit (INDEPENDENT_AMBULATORY_CARE_PROVIDER_SITE_OTHER): Payer: Medicare Other | Admitting: Cardiovascular Disease

## 2014-10-05 ENCOUNTER — Encounter: Payer: Self-pay | Admitting: Cardiovascular Disease

## 2014-10-05 VITALS — BP 140/74 | HR 60 | Ht 68.0 in | Wt 168.8 lb

## 2014-10-05 DIAGNOSIS — R9431 Abnormal electrocardiogram [ECG] [EKG]: Secondary | ICD-10-CM

## 2014-10-05 NOTE — Patient Instructions (Signed)
Medication Instructions:  NO CHANGES  Labwork: NONE  Testing/Procedures: NONE  Follow-Up:  Your physician recommends that you schedule a follow-up appointment in: AS NEEDED Any Other Special Instructions Will Be Listed Below (If Applicable).   

## 2014-10-05 NOTE — Progress Notes (Signed)
Patient ID: Troy Ortiz, male   DOB: 04/12/1935, 79 y.o.   MRN: [id number]     Cardiology Office Note   Date:  10/05/2014   ID:  Kendal Hymen, DOB 05/28/1935, MRN [id number]  PCP:  Gildardo Cranker, DO  Cardiologist:   Jenkins Rouge, MD   No chief complaint on file.     History of Present Illness: Troy Ortiz is a 79 y.o. male who presents for evaluation of abnormal ECG  Has chronic 2 drug Rx for HTN.  ECG reviewed from June no old one to compare Shows LVH with strain.  Gets [facility] of his care at [facility].  No history of CAD, MI, CHF syncope.  Compliant with meds.  He is active doing odd Architect jobs.  Retired from [employer] Does all ADL with no restrictions. Has not had echo before No family history of syncope or HOCM      Past Medical History  Diagnosis Date  . Cervicalgia   . Lumbago   . Unspecified constipation   . Rash and other nonspecific skin eruption   . Conjunctivitis unspecified   . Unspecified late effects of cerebrovascular disease   . Anemia, unspecified   . Generalized hyperhidrosis   . Other abnormal glucose   . Neoplasm of uncertain behavior of skin   . Enthesopathy of unspecified site   . Other and unspecified hyperlipidemia   . Unspecified essential hypertension   . Reflux esophagitis   . Dizziness and giddiness   . Allergic rhinitis, cause unspecified   . Arthritis   . Asthma     AS A CHILD  . Stroke     MINI STROKES    Past Surgical History  Procedure Laterality Date  . Colonoscopy       Current Outpatient Prescriptions  Medication Sig Dispense Refill  . amLODipine (NORVASC) 10 MG tablet Take 1 tablet by mouth once daily 30 tablet 3  . aspirin 81 MG tablet Take 1 tablet (81 mg total) by mouth daily. 30 tablet 3  . lisinopril (PRINIVIL,ZESTRIL) 5 MG tablet Take one tablet by mouth once daily for blood pressure 90 tablet 1  . Meclizine HCl 25 MG CHEW Chew 1 tablet (25 mg total) by mouth 2 (two) times daily. 60 each 3  .  polyethylene glycol (MIRALAX) packet Take 17 g by mouth daily. 14 each 3  . senna-docusate (SENOKOT-S) 8.6-50 MG per tablet Take 1 tablet by mouth daily. 30 tablet 3  . simvastatin (ZOCOR) 40 MG tablet Take 1 tablet (40 mg total) by mouth at bedtime. 30 tablet 3  . Tdap (BOOSTRIX) 5-2.5-18.5 LF-MCG/0.5 injection Inject 0.5 mLs into the muscle once. 0.5 mL 0  . traMADol (ULTRAM) 50 MG tablet Take 1 tablet (50 mg total) by mouth every 6 (six) hours as needed. 15 tablet 0   No current facility-administered medications for this visit.    Allergies:   Review of patient's allergies indicates no known allergies.    Social History:  The patient  reports that he has quit smoking. He has never used smokeless tobacco. He reports that he does not drink alcohol or use illicit drugs.   Family History:  The patient's family history includes Heart disease in his mother.    ROS:  Please see the history of present illness.   Otherwise, review of systems are positive for none.   All other systems are reviewed and negative.    PHYSICAL EXAM: VS:  BP 140/74 mmHg  Pulse 60  Ht 5\' 8"  (1.727 m)  Wt 76.567 kg (168 lb 12.8 oz)  BMI 25.67 kg/m2 , BMI Body mass index is 25.67 kg/(m^2). Affect appropriate Healthy:  appears stated age 81: normal Neck supple with no adenopathy JVP normal no bruits no thyromegaly Lungs clear with no wheezing and good diaphragmatic motion Heart:  S1/S2 no murmur, no rub, gallop or click PMI normal Abdomen: benighn, BS positve, no tenderness, no AAA no bruit.  No HSM or HJR Distal pulses intact with no bruits No edema Neuro non-focal Skin warm and dry No muscular weakness    EKG:   07/28/14  SR rate 53  LVH with starin    Recent Labs: 12/23/2013: ALT 8; BUN 14; Creatinine, Ser 0.74*; Hemoglobin 12.5*; Platelets 261; Potassium 3.9; Sodium 144    Lipid Panel    Component Value Date/Time   CHOL 257* 12/23/2013 0906   CHOL  08/09/2008 0600    150        ATP III  CLASSIFICATION:  <200     mg/dL   Desirable  200-239  mg/dL   Borderline High  >=240    mg/dL   High          TRIG 97 12/23/2013 0906   HDL 60 12/23/2013 0906   HDL 37* 08/09/2008 0600   CHOLHDL 4.3 12/23/2013 0906   CHOLHDL 4.1 08/09/2008 0600   VLDL 30 08/09/2008 0600   LDLCALC 178* 12/23/2013 0906   LDLCALC  08/09/2008 0600    83        Total Cholesterol/HDL:CHD Risk Coronary Heart Disease Risk Table                     Men   Women  1/2 Average Risk   3.4   3.3  Average Risk       5.0   4.4  2 X Average Risk   9.6   7.1  3 X Average Risk  23.4   11.0        Use the calculated Patient Ratio above and the CHD Risk Table to determine the patient's CHD Risk.        ATP III CLASSIFICATION (LDL):  <100     mg/dL   Optimal  100-129  mg/dL   Near or Above                    Optimal  130-159  mg/dL   Borderline  160-189  mg/dL   High  >190     mg/dL   Very High      Wt Readings from Last 3 Encounters:  10/05/14 76.567 kg (168 lb 12.8 oz)  08/15/14 76.204 kg (168 lb)  07/28/14 75.932 kg (167 lb 6.4 oz)      Other studies Reviewed: Additional studies/ records that were reviewed today include: Epic notes and records including ECG.    ASSESSMENT AND PLAN:  1.  Abnormal ECG:  Likely just LVH from longstanding HTN.  F/U echo He is active with no chest pain and no history of CAD so don't think stress test needed 2. HTN:  Well controlled.  Continue current medications and low sodium Dash type diet.   3. LDL 178 on statin f/u primary    Current medicines are reviewed at length with the patient today.  The patient does not have concerns regarding medicines.  The following changes have been made:  no change  Labs/ tests ordered today include: Echo  No orders  of the defined types were placed in this encounter.     Disposition:   FU with me next available      Signed, Jenkins Rouge, MD  10/05/2014 10:45 AM    [address],  [place], [place]  [address] Phone: [phone]; Fax: [phone]

## 2014-10-09 ENCOUNTER — Ambulatory Visit (INDEPENDENT_AMBULATORY_CARE_PROVIDER_SITE_OTHER): Payer: Medicare Other | Admitting: Family Medicine

## 2014-10-09 ENCOUNTER — Ambulatory Visit (INDEPENDENT_AMBULATORY_CARE_PROVIDER_SITE_OTHER): Payer: Medicare Other

## 2014-10-09 VITALS — BP 148/60 | HR 55 | Temp 98.1°F | Resp 16 | Ht 68.0 in | Wt 168.2 lb

## 2014-10-09 DIAGNOSIS — M25561 Pain in right knee: Secondary | ICD-10-CM

## 2014-10-09 DIAGNOSIS — M7150 Other bursitis, not elsewhere classified, unspecified site: Secondary | ICD-10-CM

## 2014-10-09 DIAGNOSIS — M7050 Other bursitis of knee, unspecified knee: Secondary | ICD-10-CM

## 2014-10-09 MED ORDER — DICLOFENAC SODIUM 75 MG PO TBEC: 75.0000 mg | DELAYED_RELEASE_TABLET | Freq: Two times a day (BID) | ORAL | Status: DC

## 2014-10-09 NOTE — Patient Instructions (Addendum)
Take diclofenac one twice daily for pain and inflammation  With breakfast and supper  If you are not doing better over the next week or 10 days return can give an injection of cortisone.   Return sooner if problems  Bursitis Bursitis is a swelling and soreness (inflammation) of a fluid-filled sac (bursa) that overlies and protects a joint. It can be caused by injury, overuse of the joint, arthritis or infection. The joints most likely to be affected are the elbows, shoulders, hips and knees. HOME CARE INSTRUCTIONS   Apply ice to the affected area for 15-20 minutes each hour while awake for 2 days. Put the ice in a plastic bag and place a towel between the bag of ice and your skin.  Rest the injured joint as much as possible, but continue to put the joint through a full range of motion, 4 times per day. (The shoulder joint especially becomes rapidly "frozen" if not used.) When the pain lessens, begin normal slow movements and usual activities.  Only take over-the-counter or prescription medicines for pain, discomfort or fever as directed by your caregiver.  Your caregiver may recommend draining the bursa and injecting medicine into the bursa. This may help the healing process.  Follow all instructions for follow-up with your caregiver. This includes any orthopedic referrals, physical therapy and rehabilitation. Any delay in obtaining necessary care could result in a delay or failure of the bursitis to heal and chronic pain. SEEK IMMEDIATE MEDICAL CARE IF:   Your pain increases even during treatment.  You develop an oral temperature above 102 F (38.9 C) and have heat and inflammation over the involved bursa. MAKE SURE YOU:   Understand these instructions.  Will watch your condition.  Will get help right away if you are not doing well or get worse. Document Released: 01/18/2000 Document Revised: 04/14/2011 Document Reviewed: 04/11/2013 [facility] Patient Information 2015 [place],  [place]. This information is not intended to replace advice given to you by your health care provider. Make sure you discuss any questions you have with your health care provider.

## 2014-10-09 NOTE — Addendum Note (Signed)
Addended by: Selma Mink H on: 10/09/2014 02:41 PM   Modules accepted: Level of Service

## 2014-10-09 NOTE — Progress Notes (Addendum)
Right knee pain Subjective:  Patient ID: Troy Ortiz, male    DOB: 12-Feb-1935  Age: 79 y.o. MRN: [id number]   patient is here complaining of pain in the medial aspect of the right knee for the last few weeks. He knows of no injuries. He stays active however. He has been rubbing some horse liniment into it and now has been putting some pain patches and a elastic knee brace on. It still hurts him a lot.   Objective:    no effusion or swelling of the knee noted. No effusion on palpation. He is nontender laterally. He is tender on the medial aspect of the right knee at the area of the pes anserine bursa. It is exquisitely tender there.  Assessment & Plan:   Assessment:  Pes anserine bursitis causing knee pain  Plan:   x-ray right knee  [facility] reading (PRIMARY) by  Dr. Linna Darner Normal knee .     offered treatment options of oral, oral and injection, or referral. He did not want a shot today, will try the oral medication, and if the diclofenac is not helping sufficiently he will come in to get it injected.  Patient Instructions   Take diclofenac one twice daily for pain and inflammation  With breakfast and supper  If you are not doing better over the next week or 10 days return can give an injection of cortisone.   Return sooner if problems     Jovee Dettinger, MD 10/09/2014

## 2014-10-16 ENCOUNTER — Ambulatory Visit (HOSPITAL_COMMUNITY): Payer: Medicare Other | Attending: Cardiovascular Disease

## 2014-10-16 ENCOUNTER — Other Ambulatory Visit: Payer: Self-pay

## 2014-10-16 DIAGNOSIS — R9431 Abnormal electrocardiogram [ECG] [EKG]: Secondary | ICD-10-CM | POA: Diagnosis not present

## 2014-10-16 DIAGNOSIS — I517 Cardiomegaly: Secondary | ICD-10-CM | POA: Insufficient documentation

## 2014-10-16 DIAGNOSIS — I10 Essential (primary) hypertension: Secondary | ICD-10-CM | POA: Insufficient documentation

## 2014-10-25 ENCOUNTER — Encounter: Payer: Self-pay | Admitting: Internal Medicine

## 2014-10-25 ENCOUNTER — Ambulatory Visit (INDEPENDENT_AMBULATORY_CARE_PROVIDER_SITE_OTHER): Payer: Medicare Other | Admitting: Internal Medicine

## 2014-10-25 VITALS — BP 118/66 | HR 56 | Temp 97.5°F | Resp 20 | Ht 68.0 in | Wt 168.8 lb

## 2014-10-25 DIAGNOSIS — H6692 Otitis media, unspecified, left ear: Secondary | ICD-10-CM

## 2014-10-25 DIAGNOSIS — J301 Allergic rhinitis due to pollen: Secondary | ICD-10-CM

## 2014-10-25 DIAGNOSIS — R42 Dizziness and giddiness: Secondary | ICD-10-CM | POA: Diagnosis not present

## 2014-10-25 MED ORDER — AMOXICILLIN 500 MG PO CAPS: 500.0000 mg | ORAL_CAPSULE | Freq: Two times a day (BID) | ORAL | Status: DC

## 2014-10-25 NOTE — Progress Notes (Signed)
Patient ID: Troy Ortiz, male   DOB: 11-Feb-1935, 79 y.o.   MRN: [id number]    Location:    [facility]   Place of Service:   OFFICE  Chief Complaint  Patient presents with  . Acute Visit    Patient c/o he is light headed and congested   . Immunizations    Will take flue shot today    HPI:  79 yo male seen today for ear pressure. He reports 1 week hx sinus pressure, dizziness, nasal congestion. No HA but "head feels heavy and full". He notes dizziness when he turns over to the left. No ear pain or d/c. Tried OTC wax remover with no relief. No sick contacts.he had increased sweating but no obvious f/c. No sore throat. No nausea. No interruption of sleep. Applying Vicks vapor rub on face and nostrils to help congestion. Hx allergic rhinitis. Takes prn benadryl. Has not tried OTC claritin, allegra or zyrtec  Past Medical History  Diagnosis Date  . Cervicalgia   . Lumbago   . Unspecified constipation   . Rash and other nonspecific skin eruption   . Conjunctivitis unspecified   . Unspecified late effects of cerebrovascular disease   . Generalized hyperhidrosis   . Other abnormal glucose   . Neoplasm of uncertain behavior of skin   . Enthesopathy of unspecified site   . Other and unspecified hyperlipidemia   . Unspecified essential hypertension   . Reflux esophagitis   . Dizziness and giddiness   . Allergic rhinitis, cause unspecified   . Arthritis   . Asthma     AS A CHILD  . Stroke     MINI STROKES  . Allergy     Past Surgical History  Procedure Laterality Date  . Colonoscopy      Patient Care Team: Gildardo Cranker, DO as PCP - General (Internal Medicine)  Social History   Social History  . Marital Status: Married    Spouse Name: N/A  . Number of Children: 59  . Years of Education: N/A   Occupational History  . Dealer     retired   Social History Main Topics  . Smoking status: Former Research scientist (life sciences)  . Smokeless tobacco: Never Used     Comment: Quit at age 22  . Alcohol  Use: No  . Drug Use: No  . Sexual Activity: Not on file   Other Topics Concern  . Not on file   Social History Narrative     reports that he has quit smoking. He has never used smokeless tobacco. He reports that he does not drink alcohol or use illicit drugs.  No Known Allergies  Medications: Patient's Medications  New Prescriptions   No medications on file  Previous Medications   AMLODIPINE (NORVASC) 10 MG TABLET    Take 1 tablet by mouth once daily   ASPIRIN 81 MG TABLET    Take 1 tablet (81 mg total) by mouth daily.   DICLOFENAC (VOLTAREN) 75 MG EC TABLET    Take one tablet by mouth twice daily for pain and inflammation With breakfast and supper   LISINOPRIL (PRINIVIL,ZESTRIL) 5 MG TABLET    Take one tablet by mouth once daily for blood pressure   MECLIZINE HCL 25 MG CHEW    Chew 1 tablet (25 mg total) by mouth 2 (two) times daily.   POLYETHYLENE GLYCOL (MIRALAX) PACKET    Take 17 g by mouth daily.   SENNA-DOCUSATE (SENOKOT-S) 8.6-50 MG PER TABLET    Take  1 tablet by mouth daily.   SIMVASTATIN (ZOCOR) 40 MG TABLET    Take 1 tablet (40 mg total) by mouth at bedtime.   TDAP (BOOSTRIX) 5-2.5-18.5 LF-MCG/0.5 INJECTION    Inject 0.5 mLs into the muscle once.   TRAMADOL (ULTRAM) 50 MG TABLET    Take 1 tablet (50 mg total) by mouth every 6 (six) hours as needed.  Modified Medications   No medications on file  Discontinued Medications   DICLOFENAC (VOLTAREN) 75 MG EC TABLET    Take 1 tablet (75 mg total) by mouth 2 (two) times daily.   HYDROCODONE-ACETAMINOPHEN (NORCO/VICODIN) 5-325 MG PER TABLET    Take 1-2 tablet by mouth every 4-6 hours if needed for pain    Review of Systems  Constitutional: Positive for diaphoresis. Negative for fever, chills and fatigue.  HENT: Positive for congestion, facial swelling, mouth sores, rhinorrhea and sinus pressure. Negative for ear discharge, ear pain, hearing loss, postnasal drip, sneezing, sore throat, tinnitus and trouble swallowing.     Respiratory: Positive for cough. Negative for shortness of breath.   Cardiovascular: Negative for chest pain.  Gastrointestinal: Negative for nausea.  Musculoskeletal: Positive for arthralgias.  Neurological: Positive for dizziness and light-headedness.    Filed Vitals:   10/25/14 1100  BP: 118/66  Pulse: 56  Temp: 97.5 F (36.4 C)  TempSrc: Oral  Resp: 20  Height: 5\' 8"  (1.727 m)  Weight: 168 lb 12.8 oz (76.567 kg)  SpO2: 98%   Body mass index is 25.67 kg/(m^2).  Physical Exam  Constitutional: He appears well-developed and well-nourished.  HENT:  Left TM dull, red and retracted but intact. Right TM appears nml. No sinus TTP but boggy tissue texture changes noted at maxillary sinuses. Nares with enlarged, grey dry, pale turbinates. No nasal lesions. Oropharynx cobblestoning appearance with redness but no exudate  Eyes: Pupils are equal, round, and reactive to light. No scleral icterus.  Neck: Neck supple. No tracheal deviation present.  Cardiovascular: Normal rate, regular rhythm, normal heart sounds and intact distal pulses.  Exam reveals no gallop and no friction rub.   No murmur heard. No edema distal LE b/l  Pulmonary/Chest: Effort normal and breath sounds normal. No stridor. No respiratory distress. He has no wheezes. He has no rales.  Lymphadenopathy:    He has no cervical adenopathy.  Neurological: He is alert.  Skin: Skin is warm and dry. No rash noted.  Psychiatric: He has a normal mood and affect. His behavior is normal. Thought content normal.     Labs reviewed: No visits with results within 3 Month(s) from this visit. Latest known visit with results is:  Appointment on 12/23/2013  Component Date Value Ref Range Status  . WBC 12/23/2013 5.5  3.4 - 10.8 x10E3/uL Final  . RBC 12/23/2013 4.28  4.14 - 5.80 x10E6/uL Final  . Hemoglobin 12/23/2013 12.5* 12.6 - 17.7 g/dL Final  . HCT 12/23/2013 36.8* 37.5 - 51.0 % Final  . MCV 12/23/2013 86  79 - 97 fL Final  .  MCH 12/23/2013 29.2  26.6 - 33.0 pg Final  . MCHC 12/23/2013 34.0  31.5 - 35.7 g/dL Final  . RDW 12/23/2013 14.1  12.3 - 15.4 % Final  . Platelets 12/23/2013 261  150 - 379 x10E3/uL Final  . Glucose 12/23/2013 111* 65 - 99 mg/dL Final  . BUN 12/23/2013 14  8 - 27 mg/dL Final  . Creatinine, Ser 12/23/2013 0.74* 0.76 - 1.27 mg/dL Final  . GFR calc non Af Wyvonnia Lora  12/23/2013 90  >59 mL/min/1.73 Final  . GFR calc Af Amer 12/23/2013 104  >59 mL/min/1.73 Final  . BUN/Creatinine Ratio 12/23/2013 19  10 - 22 Final  . Sodium 12/23/2013 144  134 - 144 mmol/L Final  . Potassium 12/23/2013 3.9  3.5 - 5.2 mmol/L Final  . Chloride 12/23/2013 103  97 - 108 mmol/L Final  . CO2 12/23/2013 26  18 - 29 mmol/L Final  . Calcium 12/23/2013 9.3  8.6 - 10.2 mg/dL Final  . Total Protein 12/23/2013 6.9  6.0 - 8.5 g/dL Final  . Albumin 12/23/2013 4.2  3.5 - 4.8 g/dL Final  . Globulin, Total 12/23/2013 2.7  1.5 - 4.5 g/dL Final  . Albumin/Globulin Ratio 12/23/2013 1.6  1.1 - 2.5 Final  . Total Bilirubin 12/23/2013 0.3  0.0 - 1.2 mg/dL Final  . Alkaline Phosphatase 12/23/2013 70  39 - 117 IU/L Final  . AST 12/23/2013 19  0 - 40 IU/L Final  . ALT 12/23/2013 8  0 - 44 IU/L Final  . Cholesterol, Total 12/23/2013 257* 100 - 199 mg/dL Final  . Triglycerides 12/23/2013 97  0 - 149 mg/dL Final  . HDL 12/23/2013 60  >39 mg/dL Final   Comment: According to ATP-III Guidelines, HDL-C >59 mg/dL is considered a negative risk factor for CHD.   Marland Kitchen VLDL Cholesterol Cal 12/23/2013 19  5 - 40 mg/dL Final  . LDL Calculated 12/23/2013 178* 0 - 99 mg/dL Final  . Chol/HDL Ratio 12/23/2013 4.3  0.0 - 5.0 ratio units Final   Comment:                                   T. Chol/HDL Ratio                                             Men  Women                               1/2 Avg.Risk  3.4    3.3                                   Avg.Risk  5.0    4.4                                2X Avg.Risk  9.6    7.1                                3X  Avg.Risk 23.4   11.0     Dg Knee 1-2 Views Right  10/09/2014   CLINICAL DATA:  Medial knee pain for weeks.  EXAM: RIGHT KNEE - 1-2 VIEW  COMPARISON:  None.  FINDINGS: There is no evidence of fracture, dislocation, or joint effusion. There is no evidence of arthropathy or other focal bone abnormality. Soft tissues are unremarkable.  IMPRESSION: Negative.   Electronically Signed   By: Nolon Nations M.D.   On: 10/09/2014 14:58     Assessment/Plan   ICD-9-CM ICD-10-CM   1. Acute left otitis media,  recurrence not specified, unspecified otitis media type 382.9 H66.92   2. Allergic rhinitis due to pollen 477.0 J30.1   3. Dizziness 780.4 R42    Push fluids and rest  Start OTC probiotic (eg, yogurt, culturelle, etc) daily while taking antibiotic  Take plain claritin, allegra or zyrtec daily for seasonal allergy  Use saline nasal spray as needed to keep nose moist  Continue other medications as ordered  RTO for fasting labs  No flu shot today due to illness  Follow up as scheduled  Monica S. Perlie Gold  [facility] and Adult Medicine [address] [place], [place] [address] [phone] Cell (Monday-Friday 8 AM - 5 PM) [phone] After 5 PM and follow prompts

## 2014-10-25 NOTE — Patient Instructions (Signed)
Push fluids and rest  Start OTC probiotic (eg, yogurt, culturelle, etc) daily while taking antibiotic  Take plain claritin, allegra or zyrtec daily for seasonal allergy  Use saline nasal spray as needed to keep nose moist  Continue other medications as ordered  RTO for fasting labs  No flu shot today due to illness  Follow up as scheduled

## 2014-12-12 ENCOUNTER — Other Ambulatory Visit: Payer: Self-pay | Admitting: Internal Medicine

## 2015-01-01 ENCOUNTER — Other Ambulatory Visit: Payer: Self-pay | Admitting: *Deleted

## 2015-01-01 MED ORDER — AMLODIPINE BESYLATE 10 MG PO TABS: ORAL_TABLET | ORAL | Status: DC

## 2015-01-01 NOTE — Telephone Encounter (Signed)
[facility] [place] 

## 2015-02-14 ENCOUNTER — Ambulatory Visit (INDEPENDENT_AMBULATORY_CARE_PROVIDER_SITE_OTHER): Payer: Medicare Other | Admitting: Internal Medicine

## 2015-02-14 ENCOUNTER — Encounter: Payer: Self-pay | Admitting: Internal Medicine

## 2015-02-14 VITALS — BP 156/62 | HR 59 | Temp 97.9°F | Resp 20 | Ht 68.0 in | Wt 155.0 lb

## 2015-02-14 DIAGNOSIS — E785 Hyperlipidemia, unspecified: Secondary | ICD-10-CM | POA: Diagnosis not present

## 2015-02-14 DIAGNOSIS — M2550 Pain in unspecified joint: Secondary | ICD-10-CM | POA: Diagnosis not present

## 2015-02-14 DIAGNOSIS — J301 Allergic rhinitis due to pollen: Secondary | ICD-10-CM | POA: Diagnosis not present

## 2015-02-14 DIAGNOSIS — K5901 Slow transit constipation: Secondary | ICD-10-CM

## 2015-02-14 DIAGNOSIS — I10 Essential (primary) hypertension: Secondary | ICD-10-CM | POA: Diagnosis not present

## 2015-02-14 MED ORDER — AMLODIPINE BESYLATE 10 MG PO TABS: ORAL_TABLET | ORAL | Status: DC

## 2015-02-14 MED ORDER — LORATADINE 10 MG PO TABS: 10.0000 mg | ORAL_TABLET | Freq: Every day | ORAL | Status: DC

## 2015-02-14 MED ORDER — METHYLPREDNISOLONE 4 MG PO TBPK: ORAL_TABLET | ORAL | Status: DC

## 2015-02-14 NOTE — Patient Instructions (Signed)
Take prednisone pack as directed for seasonal allergy  Take claritin daily for seasonal allergy  Continue other medications as ordered  Will call with lab results  Follow up in 6 mos for CPE

## 2015-02-14 NOTE — Progress Notes (Signed)
Patient ID: Troy Ortiz, male   DOB: 1935/12/10, 80 y.o.   MRN: [id number]    Location:    [facility]   Place of Service:   OFFICE  Chief Complaint  Patient presents with  . Medical Management of Chronic Issues    6 mo f/u, phelgm in throat, nose    HPI:  80 yo male seen today for f/u. He c/o sinus/nasal congestion with rhinorrhea and mucous pdtn. Dry cough. No f/c. No sick contacts. Sx's present x few weeks. Tried OTC mucinex and herbal supplements without relief. Hx allergic rhinitis. He is out of claritin  He continues to go to the [place] and is seen at the [place] location. Next appt in Feb 2017  HTN - on amlodipine and lisinopril. On ASA daily  Knee/neck/LBP - stable. Rarely takes tramadol  Hyperlipidemia - stable on zocor. Admits to forgetting to take pill several times per week.Takes ASA daily   Past Medical History  Diagnosis Date  . Cervicalgia   . Lumbago   . Unspecified constipation   . Rash and other nonspecific skin eruption   . Conjunctivitis unspecified   . Unspecified late effects of cerebrovascular disease   . Generalized hyperhidrosis   . Other abnormal glucose   . Neoplasm of uncertain behavior of skin   . Enthesopathy of unspecified site   . Other and unspecified hyperlipidemia   . Unspecified essential hypertension   . Reflux esophagitis   . Dizziness and giddiness   . Allergic rhinitis, cause unspecified   . Arthritis   . Asthma     AS A CHILD  . Stroke [facility])     MINI STROKES  . Allergy     Past Surgical History  Procedure Laterality Date  . Colonoscopy      Patient Care Team: Gildardo Cranker, DO as PCP - General (Internal Medicine)  Social History   Social History  . Marital Status: Married    Spouse Name: N/A  . Number of Children: 63  . Years of Education: N/A   Occupational History  . Dealer     retired   Social History Main Topics  . Smoking status: Former Research scientist (life sciences)  . Smokeless tobacco: Never Used     Comment: Quit at age 49   . Alcohol Use: No  . Drug Use: No  . Sexual Activity: Not on file   Other Topics Concern  . Not on file   Social History Narrative     reports that he has quit smoking. He has never used smokeless tobacco. He reports that he does not drink alcohol or use illicit drugs.  No Known Allergies  Medications: Patient's Medications  New Prescriptions   No medications on file  Previous Medications   ASPIRIN 81 MG TABLET    Take 1 tablet (81 mg total) by mouth daily.   DICLOFENAC (VOLTAREN) 75 MG EC TABLET    Take one tablet by mouth twice daily for pain and inflammation With breakfast and supper   LISINOPRIL (PRINIVIL,ZESTRIL) 5 MG TABLET    take 1 tablet by mouth once daily for high blood pressure   MECLIZINE HCL 25 MG CHEW    Chew 1 tablet (25 mg total) by mouth 2 (two) times daily.   POLYETHYLENE GLYCOL (MIRALAX) PACKET    Take 17 g by mouth daily.   SENNA-DOCUSATE (SENOKOT-S) 8.6-50 MG PER TABLET    Take 1 tablet by mouth daily.   SIMVASTATIN (ZOCOR) 40 MG TABLET    Take 1  tablet (40 mg total) by mouth at bedtime.   TRAMADOL (ULTRAM) 50 MG TABLET    Take 1 tablet (50 mg total) by mouth every 6 (six) hours as needed.  Modified Medications   Modified Medication Previous Medication   AMLODIPINE (NORVASC) 10 MG TABLET amLODipine (NORVASC) 10 MG tablet      Take 1 tablet by mouth once daily    Take 1 tablet by mouth once daily  Discontinued Medications   AMOXICILLIN (AMOXIL) 500 MG CAPSULE    Take 1 capsule (500 mg total) by mouth 2 (two) times daily.   TDAP (BOOSTRIX) 5-2.5-18.5 LF-MCG/0.5 INJECTION    Inject 0.5 mLs into the muscle once.    Review of Systems  HENT: Positive for postnasal drip, rhinorrhea and sinus pressure.   Musculoskeletal: Positive for back pain, joint swelling, arthralgias and neck pain.  Allergic/Immunologic: Positive for environmental allergies.  All other systems reviewed and are negative.   Filed Vitals:   02/14/15 1003  BP: 156/62  Pulse: 59  Temp:  97.9 F (36.6 C)  TempSrc: Oral  Resp: 20  Height: 5\' 8"  (1.727 m)  Weight: 155 lb (70.308 kg)  SpO2: 97%   Body mass index is 23.57 kg/(m^2).  Physical Exam  Constitutional: He is oriented to person, place, and time. He appears well-developed and well-nourished.  HOH  HENT:  Mouth/Throat: Oropharynx is clear and moist.  TMs intact b/l, nonbulging no redness. No maxillary sinus TTP. Nares with enlarged grey dry turbinates. Oropharynx cobblestoning and redness but no exudate.  Eyes: Pupils are equal, round, and reactive to light. No scleral icterus.  Neck: Neck supple. Carotid bruit is not present. No thyromegaly present.  Cardiovascular: Normal rate, regular rhythm, normal heart sounds and intact distal pulses.  Exam reveals no gallop and no friction rub.   No murmur heard. no distal LE swelling. No calf TTP  Pulmonary/Chest: Effort normal and breath sounds normal. He has no wheezes. He has no rales. He exhibits no tenderness.  Abdominal: Soft. Bowel sounds are normal. He exhibits no distension, no abdominal bruit, no pulsatile midline mass and no mass. There is no tenderness. There is no rebound and no guarding.  Musculoskeletal: He exhibits edema and tenderness.  Lymphadenopathy:    He has no cervical adenopathy.  Neurological: He is alert and oriented to person, place, and time. He has normal reflexes.  Skin: Skin is warm and dry. No rash noted.  Psychiatric: He has a normal mood and affect. His behavior is normal. Judgment and thought content normal.     Labs reviewed: No visits with results within 3 Month(s) from this visit. Latest known visit with results is:  Appointment on 12/23/2013  Component Date Value Ref Range Status  . WBC 12/23/2013 5.5  3.4 - 10.8 x10E3/uL Final  . RBC 12/23/2013 4.28  4.14 - 5.80 x10E6/uL Final  . Hemoglobin 12/23/2013 12.5* 12.6 - 17.7 g/dL Final  . HCT 12/23/2013 36.8* 37.5 - 51.0 % Final  . MCV 12/23/2013 86  79 - 97 fL Final  . MCH  12/23/2013 29.2  26.6 - 33.0 pg Final  . MCHC 12/23/2013 34.0  31.5 - 35.7 g/dL Final  . RDW 12/23/2013 14.1  12.3 - 15.4 % Final  . Platelets 12/23/2013 261  150 - 379 x10E3/uL Final  . Glucose 12/23/2013 111* 65 - 99 mg/dL Final  . BUN 12/23/2013 14  8 - 27 mg/dL Final  . Creatinine, Ser 12/23/2013 0.74* 0.76 - 1.27 mg/dL Final  .  GFR calc non Af Amer 12/23/2013 90  >59 mL/min/1.73 Final  . GFR calc Af Amer 12/23/2013 104  >59 mL/min/1.73 Final  . BUN/Creatinine Ratio 12/23/2013 19  10 - 22 Final  . Sodium 12/23/2013 144  134 - 144 mmol/L Final  . Potassium 12/23/2013 3.9  3.5 - 5.2 mmol/L Final  . Chloride 12/23/2013 103  97 - 108 mmol/L Final  . CO2 12/23/2013 26  18 - 29 mmol/L Final  . Calcium 12/23/2013 9.3  8.6 - 10.2 mg/dL Final  . Total Protein 12/23/2013 6.9  6.0 - 8.5 g/dL Final  . Albumin 12/23/2013 4.2  3.5 - 4.8 g/dL Final  . Globulin, Total 12/23/2013 2.7  1.5 - 4.5 g/dL Final  . Albumin/Globulin Ratio 12/23/2013 1.6  1.1 - 2.5 Final  . Total Bilirubin 12/23/2013 0.3  0.0 - 1.2 mg/dL Final  . Alkaline Phosphatase 12/23/2013 70  39 - 117 IU/L Final  . AST 12/23/2013 19  0 - 40 IU/L Final  . ALT 12/23/2013 8  0 - 44 IU/L Final  . Cholesterol, Total 12/23/2013 257* 100 - 199 mg/dL Final  . Triglycerides 12/23/2013 97  0 - 149 mg/dL Final  . HDL 12/23/2013 60  >39 mg/dL Final   Comment: According to ATP-III Guidelines, HDL-C >59 mg/dL is considered a negative risk factor for CHD.   Marland Kitchen VLDL Cholesterol Cal 12/23/2013 19  5 - 40 mg/dL Final  . LDL Calculated 12/23/2013 178* 0 - 99 mg/dL Final  . Chol/HDL Ratio 12/23/2013 4.3  0.0 - 5.0 ratio units Final   Comment:                                   T. Chol/HDL Ratio                                             Men  Women                               1/2 Avg.Risk  3.4    3.3                                   Avg.Risk  5.0    4.4                                2X Avg.Risk  9.6    7.1                                3X  Avg.Risk 23.4   11.0     No results found.   Assessment/Plan   ICD-9-CM ICD-10-CM   1. Seasonal allergic rhinitis due to pollen 477.0 J30.1 methylPREDNISolone (MEDROL) 4 MG TBPK tablet     loratadine (CLARITIN) 10 MG tablet  2. Essential hypertension, benign - stable 401.1 I10 CMP     Urinalysis with Reflex Microscopic  3. Pain, joint, multiple sites probably due to OA 719.49 M25.50   4. Hyperlipidemia - stable on statin 272.4 E78.5 CMP     Lipid Panel  TSH  5. Slow transit constipation - stable 564.01 K59.01     Take prednisone pack as directed for seasonal allergy  Take claritin daily for seasonal allergy  Continue other medications as ordered  Check labs (CBC w diff, cmp, lipid panel, TSh and UA)  Follow up in 6 mos for CPE. Will need PSA at visit  [facility]  [facility] S. Perlie Gold  [facility] and Adult Medicine [address] [place], [place] [address] [phone] Cell (Monday-Friday 8 AM - 5 PM) [phone] After 5 PM and follow prompts

## 2015-02-15 LAB — MICROSCOPIC EXAMINATION
BACTERIA UA: NONE SEEN
CASTS: NONE SEEN /LPF
EPITHELIAL CELLS (NON RENAL): NONE SEEN /HPF (ref 0–10)

## 2015-02-15 LAB — COMPREHENSIVE METABOLIC PANEL
ALK PHOS: 83 IU/L (ref 39–117)
ALT: 10 IU/L (ref 0–44)
AST: 16 IU/L (ref 0–40)
Albumin/Globulin Ratio: 1.3 (ref 1.1–2.5)
Albumin: 3.9 g/dL (ref 3.5–4.8)
BUN/Creatinine Ratio: 16 (ref 10–22)
BUN: 11 mg/dL (ref 8–27)
Bilirubin Total: 0.2 mg/dL (ref 0.0–1.2)
CO2: 25 mmol/L (ref 18–29)
CREATININE: 0.69 mg/dL — AB (ref 0.76–1.27)
Calcium: 9.2 mg/dL (ref 8.6–10.2)
Chloride: 102 mmol/L (ref 96–106)
GFR calc Af Amer: 106 mL/min/{1.73_m2} (ref >59)
GFR calc non Af Amer: 92 mL/min/{1.73_m2} (ref >59)
GLUCOSE: 107 mg/dL — AB (ref 65–99)
Globulin, Total: 2.9 g/dL (ref 1.5–4.5)
Potassium: 4.4 mmol/L (ref 3.5–5.2)
Sodium: 143 mmol/L (ref 134–144)
Total Protein: 6.8 g/dL (ref 6.0–8.5)

## 2015-02-15 LAB — LIPID PANEL
CHOLESTEROL TOTAL: 228 mg/dL — AB (ref 100–199)
Chol/HDL Ratio: 4.7 ratio units (ref 0.0–5.0)
HDL: 49 mg/dL (ref >39)
LDL CALC: 159 mg/dL — AB (ref 0–99)
Triglycerides: 99 mg/dL (ref 0–149)
VLDL CHOLESTEROL CAL: 20 mg/dL (ref 5–40)

## 2015-02-15 LAB — URINALYSIS, ROUTINE W REFLEX MICROSCOPIC
Bilirubin, UA: NEGATIVE
Glucose, UA: NEGATIVE
Ketones, UA: NEGATIVE
LEUKOCYTES UA: NEGATIVE
Nitrite, UA: NEGATIVE
RBC, UA: NEGATIVE
Specific Gravity, UA: 1.021 (ref 1.005–1.030)
Urobilinogen, Ur: 0.2 mg/dL (ref 0.2–1.0)
pH, UA: 5.5 (ref 5.0–7.5)

## 2015-02-15 LAB — TSH: TSH: 1.87 u[IU]/mL (ref 0.450–4.500)

## 2015-04-17 ENCOUNTER — Telehealth: Payer: Self-pay | Admitting: *Deleted

## 2015-04-17 NOTE — Telephone Encounter (Signed)
Nevin Bloodgood, Nurse with [facility] called and stated that she saw patient in home and performed Urine and it had 2+ Protein. Nurse just wanted to make you aware of this. Patient is scheduled with you for a CPE on 08/15/15.

## 2015-04-17 NOTE — Telephone Encounter (Signed)
Thanks. Already noted that on his last UA

## 2015-06-05 ENCOUNTER — Telehealth: Payer: Self-pay | Admitting: *Deleted

## 2015-06-05 NOTE — Telephone Encounter (Signed)
Patient called regarding his sinuses/ hay fever, he stated that he had watery eyes, sneezing and a cough.. I told him to check at his pharmacy for something over the counter and to please ask the pharmacist what will not interact with his present medications. He stated that he would do that right now.

## 2015-07-03 ENCOUNTER — Encounter: Payer: Self-pay | Admitting: Internal Medicine

## 2015-08-15 ENCOUNTER — Encounter: Payer: Medicare Other | Admitting: Internal Medicine

## 2015-08-29 ENCOUNTER — Encounter: Payer: Self-pay | Admitting: Internal Medicine

## 2015-08-29 ENCOUNTER — Ambulatory Visit (INDEPENDENT_AMBULATORY_CARE_PROVIDER_SITE_OTHER): Payer: Medicare Other | Admitting: Internal Medicine

## 2015-08-29 VITALS — BP 120/72 | HR 50 | Temp 97.8°F | Ht 68.0 in | Wt 156.0 lb

## 2015-08-29 DIAGNOSIS — E785 Hyperlipidemia, unspecified: Secondary | ICD-10-CM | POA: Diagnosis not present

## 2015-08-29 DIAGNOSIS — R42 Dizziness and giddiness: Secondary | ICD-10-CM

## 2015-08-29 DIAGNOSIS — Z86010 Personal history of colonic polyps: Secondary | ICD-10-CM

## 2015-08-29 DIAGNOSIS — K5901 Slow transit constipation: Secondary | ICD-10-CM | POA: Diagnosis not present

## 2015-08-29 DIAGNOSIS — I679 Cerebrovascular disease, unspecified: Secondary | ICD-10-CM

## 2015-08-29 DIAGNOSIS — R2681 Unsteadiness on feet: Secondary | ICD-10-CM | POA: Diagnosis not present

## 2015-08-29 DIAGNOSIS — M2550 Pain in unspecified joint: Secondary | ICD-10-CM

## 2015-08-29 DIAGNOSIS — Z1211 Encounter for screening for malignant neoplasm of colon: Secondary | ICD-10-CM | POA: Diagnosis not present

## 2015-08-29 DIAGNOSIS — H532 Diplopia: Secondary | ICD-10-CM

## 2015-08-29 DIAGNOSIS — Z125 Encounter for screening for malignant neoplasm of prostate: Secondary | ICD-10-CM

## 2015-08-29 DIAGNOSIS — I10 Essential (primary) hypertension: Secondary | ICD-10-CM | POA: Diagnosis not present

## 2015-08-29 DIAGNOSIS — Z0000 Encounter for general adult medical examination without abnormal findings: Secondary | ICD-10-CM

## 2015-08-29 LAB — COMPREHENSIVE METABOLIC PANEL
ALK PHOS: 59 U/L (ref 40–115)
ALT: 8 U/L — AB (ref 9–46)
AST: 16 U/L (ref 10–35)
Albumin: 4 g/dL (ref 3.6–5.1)
BILIRUBIN TOTAL: 0.4 mg/dL (ref 0.2–1.2)
BUN: 15 mg/dL (ref 7–25)
CO2: 28 mmol/L (ref 20–31)
CREATININE: 0.77 mg/dL (ref 0.70–1.18)
Calcium: 9.3 mg/dL (ref 8.6–10.3)
Chloride: 103 mmol/L (ref 98–110)
GLUCOSE: 112 mg/dL — AB (ref 65–99)
Potassium: 4.2 mmol/L (ref 3.5–5.3)
SODIUM: 139 mmol/L (ref 135–146)
Total Protein: 6.6 g/dL (ref 6.1–8.1)

## 2015-08-29 LAB — CBC WITH DIFFERENTIAL/PLATELET
BASOS PCT: 1 %
Basophils Absolute: 51 cells/uL (ref 0–200)
Eosinophils Absolute: 255 cells/uL (ref 15–500)
Eosinophils Relative: 5 %
HEMATOCRIT: 35.9 % — AB (ref 38.5–50.0)
Hemoglobin: 11.8 g/dL — ABNORMAL LOW (ref 13.2–17.1)
LYMPHS PCT: 42 %
Lymphs Abs: 2142 cells/uL (ref 850–3900)
MCH: 28.9 pg (ref 27.0–33.0)
MCHC: 32.9 g/dL (ref 32.0–36.0)
MCV: 88 fL (ref 80.0–100.0)
MONO ABS: 306 {cells}/uL (ref 200–950)
MPV: 10.3 fL (ref 7.5–12.5)
Monocytes Relative: 6 %
NEUTROS PCT: 46 %
Neutro Abs: 2346 cells/uL (ref 1500–7800)
PLATELETS: 266 10*3/uL (ref 140–400)
RBC: 4.08 MIL/uL — AB (ref 4.20–5.80)
RDW: 14.2 % (ref 11.0–15.0)
WBC: 5.1 10*3/uL (ref 3.8–10.8)

## 2015-08-29 LAB — LIPID PANEL
Cholesterol: 250 mg/dL — ABNORMAL HIGH (ref 125–200)
HDL: 69 mg/dL (ref >=40)
LDL CALC: 162 mg/dL — AB (ref <130)
Total CHOL/HDL Ratio: 3.6 Ratio (ref <=5.0)
Triglycerides: 93 mg/dL (ref <150)
VLDL: 19 mg/dL (ref <30)

## 2015-08-29 LAB — TSH: TSH: 1.96 m[IU]/L (ref 0.40–4.50)

## 2015-08-29 MED ORDER — AMLODIPINE BESYLATE 10 MG PO TABS: ORAL_TABLET | ORAL | 1 refills | Status: DC

## 2015-08-29 MED ORDER — LISINOPRIL 5 MG PO TABS: 5.0000 mg | ORAL_TABLET | Freq: Every day | ORAL | 1 refills | Status: DC

## 2015-08-29 NOTE — Patient Instructions (Signed)
Encouraged him to exercise 30-45 minutes 4-5 times per week. Eat a well balanced diet. Avoid smoking. Limit alcohol intake. Wear seatbelt when riding in the car. Wear sun block (SPF >50) when spending extended times outside.  Continue current medications as ordered. Resume Aspirin daily  Will call with lab results and referral appts  [facility]  Follow up in 3 mos for routine visit

## 2015-08-29 NOTE — Progress Notes (Signed)
Patient ID: Troy Ortiz, male   DOB: Feb 10, 1935, 80 y.o.   MRN: [id number]   Location:  [facility]  Place of Service:  OFFICE  Provider: Arletha Grippe, DO  Patient Care Team: Gildardo Cranker, DO as PCP - General (Internal Medicine)  Extended Emergency Contact Information Primary Emergency Contact: Evelina Dun Address: [place], [place] Phone: [phone] Relation: Spouse  Code Status: FULL CODE Goals of Care: Advanced Directive information Advanced Directives 08/29/2015  Does patient have an advance directive? Yes  Type of Advance Directive Living will  Does patient want to make changes to advanced directive? No - Patient declined  Copy of advanced directive(s) in chart? No - copy requested  Would patient like information on creating an advanced directive? -     Chief Complaint  Patient presents with  . Annual Exam    medicare yearly exam  . Other    MMSE 27/30 passed clock drawing    HPI: Patient is a 80 y.o. male seen in today for an annual wellness exam.  He has some visual changes with double vision and has appt to see eye specialist tomorrow.  He gets dizzy when he looks up to left  He has balance issues x several yrs. He had MRI brain in 2014 that revealed remote infarct of inferior left cerebellum and significant small vessel disease of white matter. C spine MRI in 2010 showed multilevel DDD,  spinal stenosis severe at C4, C5 and C7. He has been missing several doses of ASA over the last several months  He continues to go to the [place] and is seen at the [place] location. He was last seen in Feb 2017 but has no f/u appt scheduled yet.  HTN - on amlodipine and lisinopril. Not taking  ASA daily as directed  Knee/neck/LBP - stable. Rarely takes tramadol. He does not recall taking diclofenac   Hyperlipidemia - stable on zocor. Admits to forgetting to take pill several times per week. Not taking ASA daily as  directed  Allergic rhinitis - takes claritin prn  Constipation - has to use enema prn. He takes dulcolax prn. He takes a stool softener daily   Depression screen [facility] 2/9 08/29/2015 10/09/2014 05/18/2014 12/13/2013 02/08/2013  Decreased Interest 0 0 0 0 0  Down, Depressed, Hopeless 0 0 0 0 0  PHQ - 2 Score 0 0 0 0 0    Fall Risk  08/29/2015 10/25/2014 08/15/2014 07/28/2014 06/30/2014  Falls in the past year? No No No No No   MMSE - Mini Mental State Exam 08/29/2015 07/28/2014 05/11/2013  Orientation to time 5 4 5   Orientation to Place 5 5 4   Registration 3 3 3   Attention/ Calculation 5 5 5   Recall 0 2 2  Language- name 2 objects 2 2 2   Language- repeat 1 1 1   Language- follow 3 step command 3 3 3   Language- read & follow direction 1 1 1   Write a sentence 1 1 1   Copy design 1 1 1   Total score 27 28 28      Health Maintenance  Topic Date Due  . TETANUS/TDAP  11/05/1956  . ZOSTAVAX  11/05/1997  . PNA vac Low Risk Adult (2 of 2 - PPSV23) 08/07/2015  . INFLUENZA VACCINE  09/04/2015  . COLONOSCOPY  01/31/2016    Urinary incontinence? No issues. He takes super beta prostate  Functional Status Survey: Is the patient  deaf or have difficulty hearing?: Yes Does the patient have difficulty seeing, even when wearing glasses/contacts?: Yes Does the patient have difficulty concentrating, remembering, or making decisions?: No Does the patient have difficulty walking or climbing stairs?: No Does the patient have difficulty dressing or bathing?: No Does the patient have difficulty doing errands alone such as visiting a doctor's office or shopping?: No  Exercise? He works in yard daily but has no set routine  Diet? Attempts to choose healthy foods  Vision Screening Comments: Patient has appt with eye doctor tomorrow at 1pm. Unknown of Dr. name  Hearing: uses [facility] aid. [facility]    Dentition: followed by dentist. He was told he will need dentures  Pain: controlled with tramadol  prn  Past Medical History:  Diagnosis Date  . Allergic rhinitis, cause unspecified   . Allergy   . Arthritis   . Asthma    AS A CHILD  . Cervicalgia   . Conjunctivitis unspecified   . Dizziness and giddiness   . Enthesopathy of unspecified site   . Generalized hyperhidrosis   . Lumbago   . Neoplasm of uncertain behavior of skin   . Other abnormal glucose   . Other and unspecified hyperlipidemia   . Rash and other nonspecific skin eruption   . Reflux esophagitis   . Stroke [facility])    MINI STROKES  . Unspecified constipation   . Unspecified essential hypertension   . Unspecified late effects of cerebrovascular disease     Past Surgical History:  Procedure Laterality Date  . COLONOSCOPY      Family History  Problem Relation Age of Onset  . Heart disease Mother    Family Status  Relation Status  . Mother Deceased  . Father Deceased  . Brother Deceased  . Daughter Alive  . Son Alive  . Brother Deceased  . Son Alive  . Son Alive  . Son Alive  . Daughter Alive  . Daughter Alive  . Daughter Alive  . Daughter Alive    Social History   Social History  . Marital status: Married    Spouse name: N/A  . Number of children: 45  . Years of education: N/A   Occupational History  . Dealer     retired   Social History Main Topics  . Smoking status: Former Research scientist (life sciences)  . Smokeless tobacco: Never Used     Comment: Quit at age 24  . Alcohol use No  . Drug use: No  . Sexual activity: Not on file   Other Topics Concern  . Not on file   Social History Narrative  . No narrative on file    No Known Allergies    Medication List       Accurate as of 08/29/15 10:48 AM. Always use your most recent med list.          amLODipine 10 MG tablet Commonly known as:  NORVASC Take 1 tablet by mouth once daily   aspirin 81 MG tablet Take 1 tablet (81 mg total) by mouth daily.   diclofenac 75 MG EC tablet Commonly known as:  VOLTAREN Take one tablet by mouth twice  daily for pain and inflammation With breakfast and supper   lisinopril 5 MG tablet Commonly known as:  PRINIVIL,ZESTRIL take 1 tablet by mouth once daily for high blood pressure   loratadine 10 MG tablet Commonly known as:  CLARITIN Take 1 tablet (10 mg total) by mouth daily.   Meclizine HCl  25 MG Chew Chew 1 tablet (25 mg total) by mouth 2 (two) times daily.   methylPREDNISolone 4 MG Tbpk tablet Commonly known as:  MEDROL Use as directed   polyethylene glycol packet Commonly known as:  MIRALAX Take 17 g by mouth daily.   senna-docusate 8.6-50 MG tablet Commonly known as:  Senokot-S Take 1 tablet by mouth daily.   simvastatin 40 MG tablet Commonly known as:  ZOCOR Take 1 tablet (40 mg total) by mouth at bedtime.   traMADol 50 MG tablet Commonly known as:  ULTRAM Take 1 tablet (50 mg total) by mouth every 6 (six) hours as needed.        Review of Systems:  Review of Systems  Constitutional: Negative for chills and fever.  HENT: Negative for sore throat and tinnitus.   Eyes: Positive for visual disturbance.  Respiratory: Negative for cough, shortness of breath and wheezing.   Cardiovascular: Negative for chest pain, palpitations and leg swelling.  Gastrointestinal: Negative for abdominal pain, blood in stool, constipation, diarrhea, nausea and vomiting.  Genitourinary: Negative for dysuria, frequency, hematuria and urgency.  Musculoskeletal: Positive for back pain, gait problem and neck pain. Negative for myalgias.  Skin: Negative for rash.  Allergic/Immunologic: Negative for environmental allergies.  Neurological: Positive for dizziness. Negative for tremors, seizures, weakness and headaches.  Hematological: Does not bruise/bleed easily.  Psychiatric/Behavioral: The patient is not nervous/anxious.     Physical Exam: Vitals:   08/29/15 1031  BP: 120/72  Pulse: (!) 50  Temp: 97.8 F (36.6 C)  TempSrc: Oral  SpO2: 98%  Weight: 156 lb (70.8 kg)  Height: 5'  8" (1.727 m)   Body mass index is 23.72 kg/m. Physical Exam  Constitutional: He is oriented to person, place, and time. He appears well-developed and well-nourished. No distress.  HENT:  Head: Normocephalic and atraumatic.  Right Ear: Hearing, tympanic membrane, external ear and ear canal normal.  Left Ear: Hearing, tympanic membrane, external ear and ear canal normal.  Mouth/Throat: Uvula is midline, oropharynx is clear and moist and mucous membranes are normal. He does not have dentures.  Eyes: Conjunctivae, EOM and lids are normal. Pupils are equal, round, and reactive to light. No scleral icterus.  Neck: Trachea normal and normal range of motion. Neck supple. Carotid bruit is not present. No thyroid mass and no thyromegaly present.  Cardiovascular: Normal rate, regular rhythm, normal heart sounds and intact distal pulses.  Exam reveals no gallop and no friction rub.   No murmur heard. No carotid bruit b/l. No LE edema b/l. No calf TTP.   Pulmonary/Chest: Effort normal and breath sounds normal. He has no wheezes. He has no rhonchi. He has no rales. Right breast exhibits no inverted nipple, no mass, no nipple discharge, no skin change and no tenderness. Left breast exhibits no inverted nipple, no mass, no nipple discharge, no skin change and no tenderness. Breasts are symmetrical.  Abdominal: Soft. Normal appearance, normal aorta and bowel sounds are normal. He exhibits no pulsatile midline mass and no mass. There is no hepatosplenomegaly. There is no tenderness. There is no rigidity, no rebound and no guarding. No hernia.  Musculoskeletal: He exhibits edema and tenderness.  Lymphadenopathy:       Head (right side): No posterior auricular adenopathy present.       Head (left side): No posterior auricular adenopathy present.    He has no cervical adenopathy.       Right: No supraclavicular adenopathy present.  Left: No supraclavicular adenopathy present.  Neurological: He is alert and  oriented to person, place, and time. He has normal strength and normal reflexes. No cranial nerve deficit. Gait (unsteady) abnormal.  Skin: Skin is warm, dry and intact. No rash noted. Nails show no clubbing.  Psychiatric: He has a normal mood and affect. His speech is normal and behavior is normal. Thought content normal. Cognition and memory are normal.    Labs reviewed:  Basic Metabolic Panel:  Recent Labs  02/14/15 1039  NA 143  K 4.4  CL 102  CO2 25  GLUCOSE 107*  BUN 11  CREATININE 0.69*  CALCIUM 9.2  TSH 1.870   Liver Function Tests:  Recent Labs  02/14/15 1039  AST 16  ALT 10  ALKPHOS 83  BILITOT 0.2  PROT 6.8  ALBUMIN 3.9   No results for input(s): LIPASE, AMYLASE in the last 8760 hours. No results for input(s): AMMONIA in the last 8760 hours. CBC: No results for input(s): WBC, NEUTROABS, HGB, HCT, MCV, PLT in the last 8760 hours. Lipid Panel:  Recent Labs  02/14/15 1039  CHOL 228*  HDL 49  LDLCALC 159*  TRIG 99  CHOLHDL 4.7   Lab Results  Component Value Date   HGBA1C (H) 08/08/2008    6.2 (NOTE) The ADA recommends the following therapeutic goal for glycemic control related to Hgb A1c measurement: Goal of therapy: <6.5 Hgb A1c  Reference: American Diabetes Association: Clinical Practice Recommendations 2010, Diabetes Care, 2010, 33: (Suppl  1).    Procedures: No results found.   ECG OBTAINED AND REVIEWED BY MYSELF: SB @ 54 bpm, nml axis, LAE, LVH, ST changes V2-V5, poor R wave progression. No acute ischemic changes. No changes since 07/2014  Assessment/Plan    ICD-9-CM ICD-10-CM   1. Well adult exam V70.0 Z00.00   2. Diplopia 368.2 H53.2 MR Brain Wo Contrast     MR MRA Head/Brain Wo Cm  3. Unsteady gait 781.2 R26.81 MR Brain Wo Contrast     MR MRA Head/Brain Wo Cm  4. Dizziness 780.4 R42 MR Brain Wo Contrast     MR MRA Head/Brain Wo Cm  5. Essential hypertension, benign 401.1 I10 amLODipine (NORVASC) 10 MG tablet     lisinopril  (PRINIVIL,ZESTRIL) 5 MG tablet     Comprehensive metabolic panel  6. Slow transit constipation 564.01 K59.01   7. Pain, joint, multiple sites 719.49 M25.50   8. Cerebrovascular disease, unspecified 437.9 I67.9 MR Brain Wo Contrast     MR MRA Head/Brain Wo Cm     CBC with Differential     Comprehensive metabolic panel  9. History of colonic polyps V12.72 Z86.010   10. Colon cancer screening V76.51 Z12.11 Ambulatory referral to Gastroenterology  11. Hyperlipidemia 272.4 E78.5 Lipid panel     TSH  12. Prostate cancer screening V76.44 Z12.5 PSA(Must document that pt has been informed of limitations of PSA testing.)     Pt is UTD on health maintenance. Vaccinations are UTD. Pt maintains a healthy lifestyle. Encouraged pt to exercise 30-45 minutes 4-5 times per week. Eat a well balanced diet. Avoid smoking. Limit alcohol intake. Wear seatbelt when riding in the car. Wear sun block (SPF >50) when spending extended times outside.  Continue current medications as ordered. Resume Aspirin daily  Will call with lab results and referral appts  [facility]  F/u with eye specialists as scheduled  Follow up in 3 mos for routine visit   Karmel Patricelli S.  Perlie Gold  [facility] and Adult Medicine [address] [place], [place] [address] [phone] Cell (Monday-Friday 8 AM - 5 PM) [phone] After 5 PM and follow prompts

## 2015-08-30 ENCOUNTER — Other Ambulatory Visit: Payer: Self-pay

## 2015-08-30 DIAGNOSIS — H2510 Age-related nuclear cataract, unspecified eye: Secondary | ICD-10-CM | POA: Diagnosis not present

## 2015-08-30 DIAGNOSIS — H5203 Hypermetropia, bilateral: Secondary | ICD-10-CM | POA: Diagnosis not present

## 2015-08-30 DIAGNOSIS — R972 Elevated prostate specific antigen [PSA]: Secondary | ICD-10-CM

## 2015-08-30 DIAGNOSIS — H52229 Regular astigmatism, unspecified eye: Secondary | ICD-10-CM | POA: Diagnosis not present

## 2015-08-30 DIAGNOSIS — H25013 Cortical age-related cataract, bilateral: Secondary | ICD-10-CM | POA: Diagnosis not present

## 2015-08-30 LAB — PSA: PSA: 25.26 ng/mL — AB (ref <=4.00)

## 2015-09-17 ENCOUNTER — Other Ambulatory Visit: Payer: Self-pay | Admitting: *Deleted

## 2015-09-17 DIAGNOSIS — I10 Essential (primary) hypertension: Secondary | ICD-10-CM

## 2015-09-17 MED ORDER — AMLODIPINE BESYLATE 10 MG PO TABS: ORAL_TABLET | ORAL | 1 refills | Status: DC

## 2015-09-17 NOTE — Telephone Encounter (Signed)
Patient walked in requesting refill to be sent to [facility]. Faxed.

## 2015-09-19 ENCOUNTER — Ambulatory Visit
Admission: RE | Admit: 2015-09-19 | Discharge: 2015-09-19 | Disposition: A | Discharge status: Home / Self Care | Payer: Medicare Other | Source: Ambulatory Visit | Attending: Internal Medicine | Admitting: Internal Medicine

## 2015-09-19 DIAGNOSIS — R2681 Unsteadiness on feet: Secondary | ICD-10-CM

## 2015-09-19 DIAGNOSIS — I679 Cerebrovascular disease, unspecified: Secondary | ICD-10-CM

## 2015-09-19 DIAGNOSIS — H532 Diplopia: Secondary | ICD-10-CM

## 2015-09-19 DIAGNOSIS — R2689 Other abnormalities of gait and mobility: Secondary | ICD-10-CM | POA: Diagnosis not present

## 2015-09-19 DIAGNOSIS — R42 Dizziness and giddiness: Secondary | ICD-10-CM

## 2015-09-20 ENCOUNTER — Encounter: Payer: Self-pay | Admitting: Internal Medicine

## 2015-10-16 DIAGNOSIS — R3912 Poor urinary stream: Secondary | ICD-10-CM | POA: Diagnosis not present

## 2015-10-16 DIAGNOSIS — R972 Elevated prostate specific antigen [PSA]: Secondary | ICD-10-CM | POA: Diagnosis not present

## 2015-10-16 DIAGNOSIS — N403 Nodular prostate with lower urinary tract symptoms: Secondary | ICD-10-CM | POA: Diagnosis not present

## 2015-11-12 ENCOUNTER — Encounter: Payer: Self-pay | Admitting: Gastroenterology

## 2015-11-13 ENCOUNTER — Encounter: Payer: Self-pay | Admitting: Gastroenterology

## 2015-11-30 ENCOUNTER — Encounter: Payer: Self-pay | Admitting: Internal Medicine

## 2015-11-30 ENCOUNTER — Ambulatory Visit (INDEPENDENT_AMBULATORY_CARE_PROVIDER_SITE_OTHER): Payer: Medicare Other | Admitting: Internal Medicine

## 2015-11-30 VITALS — BP 122/72 | HR 60 | Temp 97.5°F | Ht 68.0 in | Wt 160.2 lb

## 2015-11-30 DIAGNOSIS — M2550 Pain in unspecified joint: Secondary | ICD-10-CM | POA: Diagnosis not present

## 2015-11-30 DIAGNOSIS — R972 Elevated prostate specific antigen [PSA]: Secondary | ICD-10-CM | POA: Diagnosis not present

## 2015-11-30 DIAGNOSIS — J301 Allergic rhinitis due to pollen: Secondary | ICD-10-CM | POA: Diagnosis not present

## 2015-11-30 DIAGNOSIS — E782 Mixed hyperlipidemia: Secondary | ICD-10-CM

## 2015-11-30 DIAGNOSIS — R42 Dizziness and giddiness: Secondary | ICD-10-CM | POA: Diagnosis not present

## 2015-11-30 DIAGNOSIS — I10 Essential (primary) hypertension: Secondary | ICD-10-CM | POA: Diagnosis not present

## 2015-11-30 MED ORDER — MECLIZINE HCL 25 MG PO CHEW: 1.0000 | CHEWABLE_TABLET | Freq: Two times a day (BID) | ORAL | 3 refills | Status: DC

## 2015-11-30 MED ORDER — DICLOFENAC SODIUM 75 MG PO TBEC: 75.0000 mg | DELAYED_RELEASE_TABLET | Freq: Two times a day (BID) | ORAL | 1 refills | Status: DC

## 2015-11-30 MED ORDER — LISINOPRIL 5 MG PO TABS: 5.0000 mg | ORAL_TABLET | Freq: Every day | ORAL | 1 refills | Status: DC

## 2015-11-30 NOTE — Progress Notes (Signed)
Patient ID: Troy Ortiz, male   DOB: 27-Feb-1935, 80 y.o.   MRN: [id number]    Location:  [facility] Place of Service: OFFICE  Chief Complaint  Patient presents with  . Medical Management of Chronic Issues    3 month routine visit    HPI:  80 yo male seen today for f/u. He plans to travel to [place] next month to visit family. He went to the [facility] earlier this week and had labs drawn. He also had flu shot and pneumovax given. He is scheduled for colonoscopy 12/12th with Dr Ardis Hughs.   He has balance issues x several yrs. He had MRI brain in 2014 that revealed remote infarct of inferior left cerebellum and significant small vessel disease of white matter. C spine MRI in 2010 showed multilevel DDD,  spinal stenosis severe at C4, C5 and C7. He has been missing several doses of ASA over the last several months  HTN - on amlodipine and lisinopril. Not taking  ASA daily as directed  Knee/neck/LBP - stable. Rarely takes tramadol. He does not recall taking diclofenac   Hyperlipidemia - stable on zocor. Admits to forgetting to take pill several times per week. Not taking ASA daily as directed  Allergic rhinitis - takes claritin prn  Constipation - has to use enema prn. He takes dulcolax prn. He takes a stool softener daily  Elevated PSA - followed by urology Dr Karsten Ro. PSA 25.26. He is scheduled for prostate bx Oct 30th  Past Medical History:  Diagnosis Date  . Allergic rhinitis, cause unspecified   . Allergy   . Arthritis   . Asthma    AS A CHILD  . Cervicalgia   . Conjunctivitis unspecified   . Dizziness and giddiness   . Enthesopathy of unspecified site   . Generalized hyperhidrosis   . Lumbago   . Neoplasm of uncertain behavior of skin   . Other abnormal glucose   . Other and unspecified hyperlipidemia   . Rash and other nonspecific skin eruption   . Reflux esophagitis   . Stroke [facility])    MINI STROKES  . Unspecified constipation   . Unspecified essential hypertension   .  Unspecified late effects of cerebrovascular disease     Past Surgical History:  Procedure Laterality Date  . COLONOSCOPY      Patient Care Team: Gildardo Cranker, DO as PCP - General (Internal Medicine)  Social History   Social History  . Marital status: Married    Spouse name: N/A  . Number of children: 63  . Years of education: N/A   Occupational History  . Dealer     retired   Social History Main Topics  . Smoking status: Former Research scientist (life sciences)  . Smokeless tobacco: Never Used     Comment: Quit at age 61  . Alcohol use No  . Drug use: No  . Sexual activity: Not on file   Other Topics Concern  . Not on file   Social History Narrative  . No narrative on file     reports that he has quit smoking. He has never used smokeless tobacco. He reports that he does not drink alcohol or use drugs.  Family History  Problem Relation Age of Onset  . Heart disease Mother    Family Status  Relation Status  . Mother Deceased  . Father Deceased  . Brother Deceased  . Daughter Alive  . Son Alive  . Brother Deceased  . Son Alive  . Son Alive  .  Son Alive  . Daughter Alive  . Daughter Alive  . Daughter Alive  . Daughter Alive     No Known Allergies  Medications: Patient's Medications  New Prescriptions   No medications on file  Previous Medications   AMLODIPINE (NORVASC) 10 MG TABLET    Take 1 tablet by mouth once daily   ASPIRIN 81 MG TABLET    Take 1 tablet (81 mg total) by mouth daily.   DICLOFENAC (VOLTAREN) 75 MG EC TABLET    Take one tablet by mouth twice daily for pain and inflammation With breakfast and supper   LISINOPRIL (PRINIVIL,ZESTRIL) 5 MG TABLET    Take 1 tablet (5 mg total) by mouth daily. for high blood pressure   LORATADINE (CLARITIN) 10 MG TABLET    Take 1 tablet (10 mg total) by mouth daily.   MECLIZINE HCL 25 MG CHEW    Chew 1 tablet (25 mg total) by mouth 2 (two) times daily.   POLYETHYLENE GLYCOL (MIRALAX) PACKET    Take 17 g by mouth daily.    SENNA-DOCUSATE (SENOKOT-S) 8.6-50 MG PER TABLET    Take 1 tablet by mouth daily.   SIMVASTATIN (ZOCOR) 40 MG TABLET    Take 1 tablet (40 mg total) by mouth at bedtime.   TRAMADOL (ULTRAM) 50 MG TABLET    Take 1 tablet (50 mg total) by mouth every 6 (six) hours as needed.  Modified Medications   No medications on file  Discontinued Medications   No medications on file    Review of Systems  Musculoskeletal: Positive for arthralgias, back pain and gait problem.  Neurological: Positive for dizziness.  All other systems reviewed and are negative.   Vitals:   11/30/15 1145  BP: 122/72  Pulse: 60  Temp: 97.5 F (36.4 C)  TempSrc: Oral  SpO2: 97%  Weight: 160 lb 3.2 oz (72.7 kg)  Height: '5\' 8"'$  (1.727 m)   Body mass index is 24.36 kg/m.  Physical Exam  Constitutional: He is oriented to person, place, and time. He appears well-developed and well-nourished.  HOH  HENT:  Mouth/Throat: Oropharynx is clear and moist.  Eyes: Pupils are equal, round, and reactive to light. No scleral icterus.  Neck: Neck supple. Carotid bruit is not present. No thyromegaly present.  Cardiovascular: Normal rate, regular rhythm, normal heart sounds and intact distal pulses.  Exam reveals no gallop and no friction rub.   No murmur heard. no distal LE swelling. No calf TTP  Pulmonary/Chest: Effort normal and breath sounds normal. He has no wheezes. He has no rales. He exhibits no tenderness.  Abdominal: Soft. Bowel sounds are normal. He exhibits no distension, no abdominal bruit, no pulsatile midline mass and no mass. There is no tenderness. There is no rebound and no guarding.  Musculoskeletal: He exhibits edema and tenderness.  Lymphadenopathy:    He has no cervical adenopathy.  Neurological: He is alert and oriented to person, place, and time. He has normal reflexes.  Skin: Skin is warm and dry. No rash noted.  Psychiatric: He has a normal mood and affect. His behavior is normal. Judgment and thought  content normal.     Labs reviewed: No visits with results within 3 Month(s) from this visit.  Latest known visit with results is:  Office Visit on 08/29/2015  Component Date Value Ref Range Status  . WBC 08/29/2015 5.1  3.8 - 10.8 K/uL Final  . RBC 08/29/2015 4.08* 4.20 - 5.80 MIL/uL Final  . Hemoglobin 08/29/2015 11.8*  13.2 - 17.1 g/dL Final  . HCT 08/29/2015 35.9* 38.5 - 50.0 % Final  . MCV 08/29/2015 88.0  80.0 - 100.0 fL Final  . MCH 08/29/2015 28.9  27.0 - 33.0 pg Final  . MCHC 08/29/2015 32.9  32.0 - 36.0 g/dL Final  . RDW 08/29/2015 14.2  11.0 - 15.0 % Final  . Platelets 08/29/2015 266  140 - 400 K/uL Final  . MPV 08/29/2015 10.3  7.5 - 12.5 fL Final  . Neutro Abs 08/29/2015 2346  1,500 - 7,800 cells/uL Final  . Lymphs Abs 08/29/2015 2142  850 - 3,900 cells/uL Final  . Monocytes Absolute 08/29/2015 306  200 - 950 cells/uL Final  . Eosinophils Absolute 08/29/2015 255  15 - 500 cells/uL Final  . Basophils Absolute 08/29/2015 51  0 - 200 cells/uL Final  . Neutrophils Relative % 08/29/2015 46  % Final  . Lymphocytes Relative 08/29/2015 42  % Final  . Monocytes Relative 08/29/2015 6  % Final  . Eosinophils Relative 08/29/2015 5  % Final  . Basophils Relative 08/29/2015 1  % Final  . Smear Review 08/29/2015 Criteria for review not met   Final  . Sodium 08/29/2015 139  135 - 146 mmol/L Final  . Potassium 08/29/2015 4.2  3.5 - 5.3 mmol/L Final  . Chloride 08/29/2015 103  98 - 110 mmol/L Final  . CO2 08/29/2015 28  20 - 31 mmol/L Final  . Glucose, Bld 08/29/2015 112* 65 - 99 mg/dL Final  . BUN 08/29/2015 15  7 - 25 mg/dL Final  . Creat 08/29/2015 0.77  0.70 - 1.18 mg/dL Final   Comment:   For patients > or = 78 years of age: The upper reference limit for Creatinine is approximately 13% higher for people identified as African-American.     . Total Bilirubin 08/29/2015 0.4  0.2 - 1.2 mg/dL Final  . Alkaline Phosphatase 08/29/2015 59  40 - 115 U/L Final  . AST 08/29/2015 16   10 - 35 U/L Final  . ALT 08/29/2015 8* 9 - 46 U/L Final  . Total Protein 08/29/2015 6.6  6.1 - 8.1 g/dL Final  . Albumin 08/29/2015 4.0  3.6 - 5.1 g/dL Final  . Calcium 08/29/2015 9.3  8.6 - 10.3 mg/dL Final  . Cholesterol 08/29/2015 250* 125 - 200 mg/dL Final  . Triglycerides 08/29/2015 93  <150 mg/dL Final  . HDL 08/29/2015 69  >=40 mg/dL Final  . Total CHOL/HDL Ratio 08/29/2015 3.6  <=5.0 Ratio Final  . VLDL 08/29/2015 19  <30 mg/dL Final  . LDL Cholesterol 08/29/2015 162* <130 mg/dL Final   Comment:   Total Cholesterol/HDL Ratio:CHD Risk                        Coronary Heart Disease Risk Table                                        Men       Women          1/2 Average Risk              3.4        3.3              Average Risk              5.0  4.4           2X Average Risk              9.6        7.1           3X Average Risk             23.4       11.0 Use the calculated Patient Ratio above and the CHD Risk table  to determine the patient's CHD Risk.   Marland Kitchen PSA 08/30/2015 25.26* <=4.00 ng/mL Final   Comment: Test Methodology: ECLIA PSA (Electrochemiluminescence Immunoassay)   For PSA values from 2.5-4.0, particularly in younger men <37 years old, the AUA and NCCN suggest testing for % Free PSA (3515) and evaluation of the rate of increase in PSA (PSA velocity).   . TSH 08/29/2015 1.96  0.40 - 4.50 mIU/L Final    No results found.   Assessment/Plan   ICD-9-CM ICD-10-CM   1. Essential hypertension, benign 401.1 I10 lisinopril (PRINIVIL,ZESTRIL) 5 MG tablet  2. Pain, joint, multiple sites 719.49 M25.50 diclofenac (VOLTAREN) 75 MG EC tablet  3. Dizziness 780.4 R42 Meclizine HCl 25 MG CHEW  4. Elevated PSA 790.93 R97.20   5. Mixed hyperlipidemia 272.2 E78.2   6. Chronic allergic rhinitis due to pollen, unspecified seasonality 477.0 J30.1    Continue current medications as ordered  Follow up with Urology Dr Karsten Ro and GI Dr Ardis Hughs  Follow up with [facility] as  scheduled  Follow up in 3 mos for routine visit  Demarco Bacci S. Perlie Gold  [facility] and Adult Medicine [address] [place], [place] [address] [phone] Cell (Monday-Friday 8 AM - 5 PM) [phone] After 5 PM and follow prompts

## 2015-11-30 NOTE — Patient Instructions (Addendum)
Continue current medications as ordered  Follow up with Urology Dr Karsten Ro and GI Dr Ardis Hughs  Follow up with [facility] as scheduled  Follow up in 3 mos for routine visit

## 2016-01-14 ENCOUNTER — Other Ambulatory Visit: Payer: Self-pay | Admitting: *Deleted

## 2016-01-14 DIAGNOSIS — I10 Essential (primary) hypertension: Secondary | ICD-10-CM

## 2016-01-14 DIAGNOSIS — J301 Allergic rhinitis due to pollen: Secondary | ICD-10-CM

## 2016-01-14 DIAGNOSIS — R42 Dizziness and giddiness: Secondary | ICD-10-CM

## 2016-01-14 MED ORDER — LISINOPRIL 5 MG PO TABS: 5.0000 mg | ORAL_TABLET | Freq: Every day | ORAL | 3 refills | Status: DC

## 2016-01-14 MED ORDER — LORATADINE 10 MG PO TABS: 10.0000 mg | ORAL_TABLET | Freq: Every day | ORAL | 3 refills | Status: DC

## 2016-01-14 MED ORDER — SIMVASTATIN 40 MG PO TABS: 40.0000 mg | ORAL_TABLET | Freq: Every day | ORAL | 3 refills | Status: DC

## 2016-01-14 MED ORDER — MECLIZINE HCL 25 MG PO CHEW: 1.0000 | CHEWABLE_TABLET | Freq: Two times a day (BID) | ORAL | 3 refills | Status: DC

## 2016-01-14 MED ORDER — AMLODIPINE BESYLATE 10 MG PO TABS: ORAL_TABLET | ORAL | 3 refills | Status: DC

## 2016-01-14 MED ORDER — ASPIRIN 81 MG PO TABS: 81.0000 mg | ORAL_TABLET | Freq: Every day | ORAL | 3 refills | Status: DC

## 2016-01-14 NOTE — Telephone Encounter (Signed)
[facility]

## 2016-01-15 ENCOUNTER — Ambulatory Visit (INDEPENDENT_AMBULATORY_CARE_PROVIDER_SITE_OTHER): Payer: Medicare Other | Admitting: Gastroenterology

## 2016-01-15 ENCOUNTER — Encounter (INDEPENDENT_AMBULATORY_CARE_PROVIDER_SITE_OTHER): Payer: Self-pay

## 2016-01-15 ENCOUNTER — Encounter: Payer: Self-pay | Admitting: Gastroenterology

## 2016-01-15 VITALS — BP 142/64 | HR 60 | Ht 64.5 in | Wt 155.5 lb

## 2016-01-15 DIAGNOSIS — Z86010 Personal history of colonic polyps: Secondary | ICD-10-CM | POA: Diagnosis not present

## 2016-01-15 NOTE — Patient Instructions (Signed)
Call if you have troubles with your GI tract (signficant constipation, diarrhea, bleeding).

## 2016-01-15 NOTE — Progress Notes (Signed)
Review of pertinent gastrointestinal problems: 1. Adenomatous colon polyps: colonoscopy 2012 Dr. Ardis Hughs two subCM adenomas removed. Colonoscopy 2001 Dr. Cristina Gong "large polyp" removed.   HPI: This is a very pleasant very pleasant 80 year old man  Whom I last saw at the time of a colonoscopy in 2012. That time he had 2 small adenomas removed. I recommended a couple months ago that he come in to see me in the office before we committed to repeat surveillance colonoscopy since he is now 80 years old.    Chief complaint is personal history of precancerous colon polyps  He was [facility] [place] biopsy but he was told 'he was too old.'  He doesn't know what this was for, what he was going to have biopsied.  No signficant heart problems or lung problems.  No colon cancer in his family.  No over bleeding.  No GI troubles.  Review of systems: Pertinent positive and negative review of systems were noted in the above HPI section. Complete review of systems was performed and was otherwise normal.   Past Medical History:  Diagnosis Date  . Allergic rhinitis, cause unspecified   . Allergy   . Arthritis   . Asthma    AS A CHILD  . Cervicalgia   . Conjunctivitis unspecified   . Dizziness and giddiness   . Enthesopathy of unspecified site   . Generalized hyperhidrosis   . Lumbago   . Neoplasm of uncertain behavior of skin   . Other abnormal glucose   . Other and unspecified hyperlipidemia   . Rash and other nonspecific skin eruption   . Reflux esophagitis   . Stroke [facility])    MINI STROKES  . Unspecified constipation   . Unspecified essential hypertension   . Unspecified late effects of cerebrovascular disease     Past Surgical History:  Procedure Laterality Date  . COLONOSCOPY      Current Outpatient Prescriptions  Medication Sig Dispense Refill  . amLODipine (NORVASC) 10 MG tablet Take 1 tablet by mouth once daily 90 tablet 3  . aspirin 81 MG tablet Take 1 tablet (81 mg total) by  mouth daily. 90 tablet 3  . lisinopril (PRINIVIL,ZESTRIL) 5 MG tablet Take 1 tablet (5 mg total) by mouth daily. for high blood pressure 90 tablet 3  . loratadine (CLARITIN) 10 MG tablet Take 1 tablet (10 mg total) by mouth daily. 90 tablet 3  . Meclizine HCl 25 MG CHEW Chew 1 tablet (25 mg total) by mouth 2 (two) times daily. 180 each 3  . omeprazole (PRILOSEC) 20 MG capsule Take 20 mg by mouth daily.    Marland Kitchen senna-docusate (SENOKOT-S) 8.6-50 MG per tablet Take 1 tablet by mouth daily. 30 tablet 3  . simvastatin (ZOCOR) 80 MG tablet Take 80 mg by mouth daily. Take one-half tablet at bedtime    . traMADol (ULTRAM) 50 MG tablet Take 1 tablet (50 mg total) by mouth every 6 (six) hours as needed. 15 tablet 0   No current facility-administered medications for this visit.     Allergies as of 01/15/2016  . (No Known Allergies)    Family History  Problem Relation Age of Onset  . Heart disease Mother     Social History   Social History  . Marital status: Married    Spouse name: N/A  . Number of children: 21  . Years of education: N/A   Occupational History  . Glass blower/designer     retired  . [employer]  retired   Social History Main Topics  . Smoking status: Former Smoker    Years: 30.00  . Smokeless tobacco: Never Used     Comment: Quit at age 89  . Alcohol use No  . Drug use: No  . Sexual activity: No   Other Topics Concern  . Not on file   Social History Narrative  . No narrative on file     Physical Exam: BP (!) 142/64   Pulse 60   Ht 5' 4.5" (1.638 m) Comment: measured without shoes  Wt 155 lb 8 oz (70.5 kg)   BMI 26.28 kg/m  Constitutional: generally well-appearing Psychiatric: alert and oriented x3 Eyes: extraocular movements intact Mouth: oral pharynx moist, no lesions Neck: supple no lymphadenopathy Cardiovascular: heart regular rate and rhythm Lungs: clear to auscultation bilaterally Abdomen: soft, nontender, nondistended, no obvious ascites, no peritoneal  signs, normal bowel sounds Extremities: no lower extremity edema bilaterally Skin: no lesions on visible extremities   Assessment and plan: 80 y.o. male with  Personal history of small precancerous colon polyps  We had a good discussion about colon cancer screening, polyp surveillance. He understands that these tests generally stops between the ages of 79 and 24. He has no concerning GI symptoms and he is not interested in further screening, surveillance testing unless it is absolutely necessary. He will call if he has any concerning changes in his bowels such as constipation, significant diarrhea, significant bleeding. Otherwise no further colorectal cancer screening, polyp surveillance.   Owens Loffler, MD [facility] 01/15/2016, 10:27 AM  Cc: Gildardo Cranker, DO

## 2016-01-16 ENCOUNTER — Other Ambulatory Visit: Payer: Self-pay | Admitting: *Deleted

## 2016-01-16 MED ORDER — SIMVASTATIN 80 MG PO TABS: ORAL_TABLET | ORAL | 3 refills | Status: DC

## 2016-01-16 MED ORDER — ASPIRIN 81 MG PO TABS: 81.0000 mg | ORAL_TABLET | Freq: Every day | ORAL | 3 refills | Status: DC

## 2016-01-16 NOTE — Telephone Encounter (Signed)
[facility]

## 2016-02-20 ENCOUNTER — Ambulatory Visit: Payer: Medicare Other | Admitting: Internal Medicine

## 2016-02-22 ENCOUNTER — Ambulatory Visit (INDEPENDENT_AMBULATORY_CARE_PROVIDER_SITE_OTHER): Payer: Medicare PPO | Admitting: Internal Medicine

## 2016-02-22 ENCOUNTER — Ambulatory Visit
Admission: RE | Admit: 2016-02-22 | Discharge: 2016-02-22 | Disposition: A | Discharge status: Home / Self Care | Payer: Medicare PPO | Source: Ambulatory Visit | Attending: Internal Medicine | Admitting: Internal Medicine

## 2016-02-22 ENCOUNTER — Encounter: Payer: Self-pay | Admitting: Internal Medicine

## 2016-02-22 ENCOUNTER — Ambulatory Visit: Payer: Medicare PPO | Admitting: Internal Medicine

## 2016-02-22 VITALS — BP 144/78 | HR 58 | Temp 97.8°F | Ht 65.0 in | Wt 159.0 lb

## 2016-02-22 DIAGNOSIS — E782 Mixed hyperlipidemia: Secondary | ICD-10-CM | POA: Diagnosis not present

## 2016-02-22 DIAGNOSIS — R42 Dizziness and giddiness: Secondary | ICD-10-CM

## 2016-02-22 DIAGNOSIS — K5901 Slow transit constipation: Secondary | ICD-10-CM

## 2016-02-22 DIAGNOSIS — J301 Allergic rhinitis due to pollen: Secondary | ICD-10-CM | POA: Diagnosis not present

## 2016-02-22 DIAGNOSIS — M2550 Pain in unspecified joint: Secondary | ICD-10-CM

## 2016-02-22 DIAGNOSIS — R972 Elevated prostate specific antigen [PSA]: Secondary | ICD-10-CM

## 2016-02-22 DIAGNOSIS — M542 Cervicalgia: Secondary | ICD-10-CM

## 2016-02-22 DIAGNOSIS — I10 Essential (primary) hypertension: Secondary | ICD-10-CM | POA: Diagnosis not present

## 2016-02-22 LAB — CBC WITH DIFFERENTIAL/PLATELET
BASOS ABS: 59 {cells}/uL (ref 0–200)
BASOS PCT: 1 %
EOS ABS: 413 {cells}/uL (ref 15–500)
Eosinophils Relative: 7 %
HEMATOCRIT: 37.4 % — AB (ref 38.5–50.0)
HEMOGLOBIN: 12.5 g/dL — AB (ref 13.2–17.1)
LYMPHS ABS: 2478 {cells}/uL (ref 850–3900)
Lymphocytes Relative: 42 %
MCH: 29.1 pg (ref 27.0–33.0)
MCHC: 33.4 g/dL (ref 32.0–36.0)
MCV: 87.2 fL (ref 80.0–100.0)
MONO ABS: 354 {cells}/uL (ref 200–950)
MPV: 10.1 fL (ref 7.5–12.5)
Monocytes Relative: 6 %
NEUTROS ABS: 2596 {cells}/uL (ref 1500–7800)
Neutrophils Relative %: 44 %
PLATELETS: 282 10*3/uL (ref 140–400)
RBC: 4.29 MIL/uL (ref 4.20–5.80)
RDW: 14.2 % (ref 11.0–15.0)
WBC: 5.9 10*3/uL (ref 3.8–10.8)

## 2016-02-22 LAB — COMPLETE METABOLIC PANEL WITH GFR
ALBUMIN: 4 g/dL (ref 3.6–5.1)
ALK PHOS: 48 U/L (ref 40–115)
ALT: 7 U/L — AB (ref 9–46)
AST: 17 U/L (ref 10–35)
BILIRUBIN TOTAL: 0.3 mg/dL (ref 0.2–1.2)
BUN: 18 mg/dL (ref 7–25)
CALCIUM: 9.5 mg/dL (ref 8.6–10.3)
CO2: 30 mmol/L (ref 20–31)
CREATININE: 0.76 mg/dL (ref 0.70–1.18)
Chloride: 103 mmol/L (ref 98–110)
GFR, Est Non African American: 87 mL/min (ref >=60)
Glucose, Bld: 89 mg/dL (ref 65–99)
Potassium: 4 mmol/L (ref 3.5–5.3)
Sodium: 140 mmol/L (ref 135–146)
TOTAL PROTEIN: 6.9 g/dL (ref 6.1–8.1)

## 2016-02-22 LAB — LIPID PANEL
CHOLESTEROL: 241 mg/dL — AB (ref <200)
HDL: 67 mg/dL (ref >40)
LDL Cholesterol: 158 mg/dL — ABNORMAL HIGH (ref <100)
TRIGLYCERIDES: 80 mg/dL (ref <150)
Total CHOL/HDL Ratio: 3.6 Ratio (ref <5.0)
VLDL: 16 mg/dL (ref <30)

## 2016-02-22 MED ORDER — DICLOFENAC SODIUM 75 MG PO TBEC: 75.0000 mg | DELAYED_RELEASE_TABLET | Freq: Two times a day (BID) | ORAL | 3 refills | Status: DC

## 2016-02-22 MED ORDER — LISINOPRIL 5 MG PO TABS: 5.0000 mg | ORAL_TABLET | Freq: Every day | ORAL | 3 refills | Status: DC

## 2016-02-22 NOTE — Progress Notes (Signed)
Patient ID: Troy Ortiz, male   DOB: 1935/11/24, 81 y.o.   MRN: [id number]    Location:  [facility] Place of Service: OFFICE  Chief Complaint  Patient presents with  . Medical Management of Chronic Issues    3 month routine visit    HPI:  81 yo male seen today for f/u. He c/o left neck pain and feels like he has a "catch" in it. Applied OTC cream without relief. He hears a "clicking" noise. He plays a lot of [place]odoku and has neck in flexed position often. No numbness/tingling in UE. He appears confused today. He is a poor historian due to altered mental status. He is also HOH  Hx CVA - He has balance issues x several yrs. 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 C spine MRI in 2010 showed multilevel DDD,  spinal stenosis severe at C4, C5 and C7. He has been missing several doses of ASA over the last several months  HTN - on amlodipine and lisinopril. Not taking  ASA daily as directed  Knee/neck/LBP - stable. C spine MRI in 2010 showed multilevel DDD,  spinal stenosis severe at C4, C5 and C7. He is out of tramadol and does not recall picking up Rx for diclofenac sent to pharmacy after last OV  Hyperlipidemia - stable on zocor. Admits to forgetting to take pill several times per week. Not taking ASA daily as directed  Allergic rhinitis - takes claritin prn. He has occasional nasal congestion  Constipation - has to use enema prn. He takes dulcolax prn. He takes a stool softener daily  Elevated PSA - followed by urology Dr Karsten Ro. PSA 25.26. He had  prostate bx rescheduled due to no one available to care for his wife who has dementia. He will f/u in Mar 2018.  Past Medical History:  Diagnosis Date  . Allergic rhinitis, cause unspecified   . Allergy   . Arthritis   . Asthma    AS A CHILD  . Cervicalgia   . Conjunctivitis unspecified   . Dizziness and giddiness   . Enthesopathy of unspecified  site   . Generalized hyperhidrosis   . Lumbago   . Neoplasm of uncertain behavior of skin   . Other abnormal glucose   . Other and unspecified hyperlipidemia   . Rash and other nonspecific skin eruption   . Reflux esophagitis   . Stroke [facility])    MINI STROKES  . Unspecified constipation   . Unspecified essential hypertension   . Unspecified late effects of cerebrovascular disease     Past Surgical History:  Procedure Laterality Date  . COLONOSCOPY      Patient Care Team: Gildardo Cranker, DO as PCP - General (Internal Medicine)  Social History   Social History  . Marital status: Married    Spouse name: N/A  . Number of children: 65  . Years of education: N/A   Occupational History  . Glass blower/designer     retired  . army     retired   Social History Main Topics  . Smoking status: Former Smoker    Years: 30.00  . Smokeless tobacco: Never Used     Comment: Quit at age 43  . Alcohol use No  . Drug use: No  . Sexual activity: No   Other Topics Concern  . Not on file   Social History Narrative  . No narrative on  file     reports that he has quit smoking. He quit after 30.00 years of use. He has never used smokeless tobacco. He reports that he does not drink alcohol or use drugs.  Family History  Problem Relation Age of Onset  . Heart disease Mother    Family Status  Relation Status  . Mother Deceased  . Father Deceased  . Brother Deceased  . Daughter Alive  . Son Alive  . Brother Deceased  . Son Alive  . Son Alive  . Son Alive  . Daughter Alive  . Daughter Alive  . Daughter Alive  . Daughter Alive     No Known Allergies  Medications: Patient's Medications  New Prescriptions   No medications on file  Previous Medications   AMLODIPINE (NORVASC) 10 MG TABLET    Take 1 tablet by mouth once daily   ASPIRIN 81 MG TABLET    Take 1 tablet (81 mg total) by mouth daily.   LORATADINE (CLARITIN) 10 MG TABLET    Take 1 tablet (10 mg total) by mouth daily.    MECLIZINE HCL 25 MG CHEW    Chew 1 tablet (25 mg total) by mouth 2 (two) times daily.   OMEPRAZOLE (PRILOSEC) 20 MG CAPSULE    Take 20 mg by mouth daily.   SENNA-DOCUSATE (SENOKOT-S) 8.6-50 MG PER TABLET    Take 1 tablet by mouth daily.   SIMVASTATIN (ZOCOR) 80 MG TABLET    Take one-half tablet by mouth at bedtime   TRAMADOL (ULTRAM) 50 MG TABLET    Take 1 tablet (50 mg total) by mouth every 6 (six) hours as needed.  Modified Medications   Modified Medication Previous Medication   LISINOPRIL (PRINIVIL,ZESTRIL) 5 MG TABLET lisinopril (PRINIVIL,ZESTRIL) 5 MG tablet      Take 1 tablet (5 mg total) by mouth daily. for high blood pressure    Take 1 tablet (5 mg total) by mouth daily. for high blood pressure  Discontinued Medications   No medications on file    Review of Systems  Unable to perform ROS: Other (altered mental status)    Vitals:   02/22/16 1319  BP: (!) 144/78  Pulse: (!) 58  Temp: 97.8 F (36.6 C)  TempSrc: Oral  SpO2: 95%  Weight: 159 lb (72.1 kg)  Height: _0  (1.651 m)   Body mass index is 26.46 kg/m.  Physical Exam  Constitutional: He appears well-developed and well-nourished.  HOH; appears confused; looks well in NAD  HENT:  Mouth/Throat: Oropharynx is clear and moist.  Eyes: Pupils are equal, round, and reactive to light. No scleral icterus.  Neck: Neck supple. Muscular tenderness present. No spinous process tenderness present. Carotid bruit is not present. Edema and decreased range of motion present. No thyromegaly present.  Cardiovascular: Normal rate, regular rhythm and intact distal pulses.  Exam reveals no gallop and no friction rub.   Murmur (1/6 SEM) heard. no distal LE swelling. No calf TTP  Pulmonary/Chest: Effort normal and breath sounds normal. He has no wheezes. He has no rales. He exhibits no tenderness.  Abdominal: Soft. Bowel sounds are normal. He exhibits no distension, no abdominal bruit, no pulsatile midline mass and no mass. There is no  tenderness. There is no rebound and no guarding.  Musculoskeletal: He exhibits edema and tenderness.  Lymphadenopathy:    He has no cervical adenopathy.  Neurological: He is alert.  Skin: Skin is warm and dry. No rash noted.  Psychiatric: He has a normal  mood and affect. His behavior is normal.   MMSE - Mini Mental State Exam 02/22/2016 08/29/2015 07/28/2014  Orientation to time _0 Orientation to Place _1 Registration _2 Attention/ Calculation _3 Recall 1 0 2  Language- name 2 objects _4 Language- repeat _5 Language- follow 3 step command _6 Language- read & follow direction _7 Write a sentence _8 Copy design _9 Total score _10 Labs reviewed: No visits with results within 3 Month(s) from this visit.  Latest known visit with results is:  Office Visit on 08/29/2015  Component Date Value Ref Range Status  . WBC 08/29/2015 5.1  3.8 - 10.8 K/uL Final  . RBC 08/29/2015 4.08* 4.20 - 5.80 MIL/uL Final  . Hemoglobin 08/29/2015 11.8* 13.2 - 17.1 g/dL Final  . HCT 08/29/2015 35.9* 38.5 - 50.0 % Final  . MCV 08/29/2015 88.0  80.0 - 100.0 fL Final  . MCH 08/29/2015 28.9  27.0 - 33.0 pg Final  . MCHC 08/29/2015 32.9  32.0 - 36.0 g/dL Final  . RDW 08/29/2015 14.2  11.0 - 15.0 % Final  . Platelets 08/29/2015 266  140 - 400 K/uL Final  . MPV 08/29/2015 10.3  7.5 - 12.5 fL Final  . Neutro Abs 08/29/2015 2346  1,500 - 7,800 cells/uL Final  . Lymphs Abs 08/29/2015 2142  850 - 3,900 cells/uL Final  . Monocytes Absolute 08/29/2015 306  200 - 950 cells/uL Final  . Eosinophils Absolute 08/29/2015 255  15 - 500 cells/uL Final  . Basophils Absolute 08/29/2015 51  0 - 200 cells/uL Final  . Neutrophils Relative % 08/29/2015 46  % Final  . Lymphocytes Relative 08/29/2015 42  % Final  . Monocytes Relative 08/29/2015 6  % Final  . Eosinophils Relative 08/29/2015 5  % Final  . Basophils Relative 08/29/2015 1  % Final  . Smear Review 08/29/2015 Criteria  for review not met   Final  . Sodium 08/29/2015 139  135 - 146 mmol/L Final  . Potassium 08/29/2015 4.2  3.5 - 5.3 mmol/L Final  . Chloride 08/29/2015 103  98 - 110 mmol/L Final  . CO2 08/29/2015 28  20 - 31 mmol/L Final  . Glucose, Bld 08/29/2015 112* 65 - 99 mg/dL Final  . BUN 08/29/2015 15  7 - 25 mg/dL Final  . Creat 08/29/2015 0.77  0.70 - 1.18 mg/dL Final   Comment:   For patients > or = 79 years of age: The upper reference limit for Creatinine is approximately 13% higher for people identified as African-American.     . Total Bilirubin 08/29/2015 0.4  0.2 - 1.2 mg/dL Final  . Alkaline Phosphatase 08/29/2015 59  40 - 115 U/L Final  . AST 08/29/2015 16  10 - 35 U/L Final  . ALT 08/29/2015 8* 9 - 46 U/L Final  . Total Protein 08/29/2015 6.6  6.1 - 8.1 g/dL Final  . Albumin 08/29/2015 4.0  3.6 - 5.1 g/dL Final  . Calcium 08/29/2015 9.3  8.6 - 10.3 mg/dL Final  . Cholesterol 08/29/2015 250* 125 - 200 mg/dL Final  . Triglycerides 08/29/2015 93  <150 mg/dL Final  . HDL 08/29/2015 69  >=40 mg/dL Final  . Total CHOL/HDL Ratio 08/29/2015 3.6  <=5.0 Ratio Final  . VLDL 08/29/2015 19  <30 mg/dL Final  .  LDL Cholesterol 08/29/2015 162* <130 mg/dL Final   Comment:   Total Cholesterol/HDL Ratio:CHD Risk                        Coronary Heart Disease Risk Table                                        Men       Women          1/2 Average Risk              3.4        3.3              Average Risk              5.0        4.4           2X Average Risk              9.6        7.1           3X Average Risk             23.4       11.0 Use the calculated Patient Ratio above and the CHD Risk table  to determine the patient's CHD Risk.   Marland Kitchen PSA 08/29/2015 25.26* <=4.00 ng/mL Final   Comment: Test Methodology: ECLIA PSA (Electrochemiluminescence Immunoassay)   For PSA values from 2.5-4.0, particularly in younger men <70 years old, the AUA and NCCN suggest testing for % Free PSA (3515)  and evaluation of the rate of increase in PSA (PSA velocity).   . TSH 08/29/2015 1.96  0.40 - 4.50 mIU/L Final    No results found.   Assessment/Plan   ICD-9-CM ICD-10-CM   1. Neck pain on left side 723.1 M54.2 DG Cervical Spine 2 or 3 views   with hx DDD; spinal stenosis Cervical spine  2. Essential hypertension, benign 401.1 I10 lisinopril (PRINIVIL,ZESTRIL) 5 MG tablet     CMP with eGFR     CBC with Differential/Platelets  3. Dizziness 780.4 R42 CMP with eGFR  4. Pain, joint, multiple sites 719.49 M25.50   5. Mixed hyperlipidemia 272.2 E78.2 Lipid Panel  6. Elevated PSA 790.93 R97.20   7. Chronic allergic rhinitis due to pollen, unspecified seasonality 477.0 J30.1   8. Slow transit constipation 564.01 K59.01     Check MMSE - scored 28/30 today. Question accuracy of test is he does appear confused today  Will call with xray and lab results  Continue current medications as ordered. Start diclofenac for arthritis  Follow up with urology Dr Karsten Ro as scheduled  Follow up with GI Dr Ardis Hughs as scheduled  Follow up in 3 mos for routine visit.   Zedric Deroy S. Perlie Gold  [facility] [address] [place], [place] [address] [phone] Cell (Monday-Friday 8 AM - 5 PM) [phone] After 5 PM and follow prompts

## 2016-02-22 NOTE — Patient Instructions (Signed)
Will call with xray and lab results  Continue current medications as ordered. Start diclofenac for arthritis  Follow up with urology Dr Karsten Ro as scheduled  Follow up with GI Dr Ardis Hughs as scheduled  Follow up in 3 mos for routine visit.

## 2016-02-25 ENCOUNTER — Other Ambulatory Visit: Payer: Self-pay

## 2016-02-25 DIAGNOSIS — S149XXA Injury of unspecified nerves of neck, initial encounter: Secondary | ICD-10-CM

## 2016-02-25 DIAGNOSIS — G589 Mononeuropathy, unspecified: Secondary | ICD-10-CM

## 2016-02-25 DIAGNOSIS — M542 Cervicalgia: Secondary | ICD-10-CM

## 2016-03-01 ENCOUNTER — Inpatient Hospital Stay: Admission: RE | Admit: 2016-03-01 | Payer: Medicare Other | Source: Ambulatory Visit

## 2016-03-10 ENCOUNTER — Ambulatory Visit
Admission: RE | Admit: 2016-03-10 | Discharge: 2016-03-10 | Disposition: A | Discharge status: Home / Self Care | Payer: Medicare PPO | Source: Ambulatory Visit | Attending: Internal Medicine | Admitting: Internal Medicine

## 2016-03-10 DIAGNOSIS — G589 Mononeuropathy, unspecified: Secondary | ICD-10-CM

## 2016-03-10 DIAGNOSIS — M542 Cervicalgia: Secondary | ICD-10-CM

## 2016-03-10 DIAGNOSIS — S149XXA Injury of unspecified nerves of neck, initial encounter: Secondary | ICD-10-CM

## 2016-03-14 ENCOUNTER — Telehealth: Payer: Self-pay

## 2016-03-14 DIAGNOSIS — M4802 Spinal stenosis, cervical region: Secondary | ICD-10-CM

## 2016-03-14 NOTE — Telephone Encounter (Signed)
Discussed results with patient, patient verbalized understanding of results. Patient in agreement with referral. Order placed

## 2016-03-14 NOTE — Telephone Encounter (Signed)
-----   Message from [place], [place] sent at 03/12/2016  4:29 PM EST ----- Refer to neuro sx for eval of spinal stenosis in neck

## 2016-03-25 ENCOUNTER — Other Ambulatory Visit: Payer: Self-pay

## 2016-03-25 DIAGNOSIS — I10 Essential (primary) hypertension: Secondary | ICD-10-CM

## 2016-03-25 MED ORDER — LISINOPRIL 5 MG PO TABS: 5.0000 mg | ORAL_TABLET | Freq: Every day | ORAL | 3 refills | Status: DC

## 2016-03-25 MED ORDER — AMLODIPINE BESYLATE 10 MG PO TABS: ORAL_TABLET | ORAL | 3 refills | Status: DC

## 2016-03-25 MED ORDER — SIMVASTATIN 80 MG PO TABS: ORAL_TABLET | ORAL | 3 refills | Status: DC

## 2016-03-25 NOTE — Telephone Encounter (Signed)
Spoke with patient, patient would now like to use [facility] for medication refills. Patient will keep [facility] on file for short term medications.

## 2016-04-02 ENCOUNTER — Ambulatory Visit: Payer: Self-pay

## 2016-04-03 ENCOUNTER — Ambulatory Visit (INDEPENDENT_AMBULATORY_CARE_PROVIDER_SITE_OTHER): Payer: Medicare PPO

## 2016-04-03 VITALS — BP 164/70 | HR 66 | Temp 98.1°F | Ht 65.0 in | Wt 156.6 lb

## 2016-04-03 DIAGNOSIS — Z0000 Encounter for general adult medical examination without abnormal findings: Secondary | ICD-10-CM | POA: Diagnosis not present

## 2016-04-03 NOTE — Patient Instructions (Addendum)
Mr. Tara , Thank you for taking time to come for your Medicare Wellness Visit. I appreciate your ongoing commitment to your health goals. Please review the following plan we discussed and let me know if I can assist you in the future.   These are the goals we discussed: Goals    . Increase water intake          Starting 04/03/16 I will attempt to increase my water intake to 6 glasses a day.       This is a list of the screening recommended for you and due dates:  Health Maintenance  Topic Date Due  . Tetanus Vaccine  11/05/1954  . Colon Cancer Screening  01/31/2016  . Flu Shot  Completed  . Pneumonia vaccines  Completed   Preventive Care for Adults  A healthy lifestyle and preventive care can promote health and wellness. Preventive health guidelines for adults include the following key practices.  . A routine yearly physical is a good way to check with your health care provider about your health and preventive screening. It is a chance to share any concerns and updates on your health and to receive a thorough exam.  . Visit your dentist for a routine exam and preventive care every 6 months. Brush your teeth twice a day and floss once a day. Good oral hygiene prevents tooth decay and gum disease.  . The frequency of eye exams is based on your age, health, family medical history, use  of contact lenses, and other factors. Follow your health care provider's ecommendations for frequency of eye exams.  . Eat a healthy diet. Foods like vegetables, fruits, whole grains, low-fat dairy products, and lean protein foods contain the nutrients you need without too many calories. Decrease your intake of foods high in solid fats, added sugars, and salt. Eat the right amount of calories for you. Get information about a proper diet from your health care provider, if necessary.  . Regular physical exercise is one of the most important things you can do for your health. Most adults should get at least  150 minutes of moderate-intensity exercise (any activity that increases your heart rate and causes you to sweat) each week. In addition, most adults need muscle-strengthening exercises on 2 or more days a week.  Silver Sneakers may be a benefit available to you. To determine eligibility, you may visit the website: www.silversneakers.com or contact program at 727-711-6431 Mon-Fri between 8AM-8PM.   . Maintain a healthy weight. The body mass index (BMI) is a screening tool to identify possible weight problems. It provides an estimate of body fat based on height and weight. Your health care provider can find your BMI and can help you achieve or maintain a healthy weight.   For adults 20 years and older: ? A BMI below 18.5 is considered underweight. ? A BMI of 18.5 to 24.9 is normal. ? A BMI of 25 to 29.9 is considered overweight. ? A BMI of 30 and above is considered obese.   . Maintain normal blood lipids and cholesterol levels by exercising and minimizing your intake of saturated fat. Eat a balanced diet with plenty of fruit and vegetables. Blood tests for lipids and cholesterol should begin at age 28 and be repeated every 5 years. If your lipid or cholesterol levels are high, you are over 50, or you are at high risk for heart disease, you may need your cholesterol levels checked more frequently. Ongoing high lipid and cholesterol levels should  be treated with medicines if diet and exercise are not working.  . If you smoke, find out from your health care provider how to quit. If you do not use tobacco, please do not start.  . If you choose to drink alcohol, please do not consume more than 2 drinks per day. One drink is considered to be 12 ounces (355 mL) of beer, 5 ounces (148 mL) of wine, or 1.5 ounces (44 mL) of liquor.  . If you are 28-68 years old, ask your health care provider if you should take aspirin to prevent strokes.  . Use sunscreen. Apply sunscreen liberally and repeatedly  throughout the day. You should seek shade when your shadow is shorter than you. Protect yourself by wearing long sleeves, pants, a wide-brimmed hat, and sunglasses year round, whenever you are outdoors.  . Once a month, do a whole body skin exam, using a mirror to look at the skin on your back. Tell your health care provider of new moles, moles that have irregular borders, moles that are larger than a pencil eraser, or moles that have changed in shape or color.

## 2016-04-03 NOTE — Progress Notes (Signed)
   I reviewed health advisor's note, was available for consultation and agree with the assessment and plan as written.    Alexy Heldt L. Shady Bradish, D.O. [facility] [address], [place] [address] Cell Phone (Mon-Fri 8am-5pm):  [phone] On Call:  [phone] & follow prompts after 5pm & weekends Office Phone:  [phone] Office Fax:  [phone]   Quick Notes   Health Maintenance:  None    Abnormal Screen: MMSE 29/30. Passed clock test    Patient Concerns:  None    Nurse Concerns:  No problems.

## 2016-04-03 NOTE — Progress Notes (Signed)
Subjective:   Pastor Yell is a 81 y.o. male who presents for an Initial Medicare Annual Wellness Visit.  Review of Systems   Cardiac Risk Factors include: advanced age (>8men, >82 women);hypertension;dyslipidemia;male gender;family history of premature cardiovascular disease;smoking/ tobacco exposure;sedentary lifestyle    Objective:    Today's Vitals   04/03/16 1101  BP: (!) 164/70  Pulse: 66  Temp: 98.1 F (36.7 C)  TempSrc: Oral  SpO2: 96%  Weight: 156 lb 9.6 oz (71 kg)  Height: 5\' 5"  (1.651 m)   Body mass index is 26.06 kg/m.  Current Medications (verified) Outpatient Encounter Prescriptions as of 04/03/2016  Medication Sig  . amLODipine (NORVASC) 10 MG tablet Take 1 tablet by mouth once daily  . aspirin 81 MG tablet Take 1 tablet (81 mg total) by mouth daily.  . diclofenac (VOLTAREN) 75 MG EC tablet Take 1 tablet (75 mg total) by mouth 2 (two) times daily.  Marland Kitchen lisinopril (PRINIVIL,ZESTRIL) 5 MG tablet Take 1 tablet (5 mg total) by mouth daily. for high blood pressure  . loratadine (CLARITIN) 10 MG tablet Take 1 tablet (10 mg total) by mouth daily.  . Meclizine HCl 25 MG CHEW Chew 1 tablet (25 mg total) by mouth 2 (two) times daily.  Marland Kitchen omeprazole (PRILOSEC) 20 MG capsule Take 20 mg by mouth daily.  Marland Kitchen senna-docusate (SENOKOT-S) 8.6-50 MG per tablet Take 1 tablet by mouth daily.  . simvastatin (ZOCOR) 80 MG tablet Take one-half tablet by mouth at bedtime  . [DISCONTINUED] traMADol (ULTRAM) 50 MG tablet Take 1 tablet (50 mg total) by mouth every 6 (six) hours as needed.   No facility-administered encounter medications on file as of 04/03/2016.     Allergies (verified) Patient has no known allergies.   History: Past Medical History:  Diagnosis Date  . Allergic rhinitis, cause unspecified   . Allergy   . Arthritis   . Asthma    AS A CHILD  . Cervicalgia   . Conjunctivitis unspecified   . Dizziness and giddiness   . Enthesopathy of unspecified site   .  Generalized hyperhidrosis   . Lumbago   . Neoplasm of uncertain behavior of skin   . Other abnormal glucose   . Other and unspecified hyperlipidemia   . Rash and other nonspecific skin eruption   . Reflux esophagitis   . Stroke [facility])    MINI STROKES  . Unspecified constipation   . Unspecified essential hypertension   . Unspecified late effects of cerebrovascular disease    Past Surgical History:  Procedure Laterality Date  . COLONOSCOPY     Family History  Problem Relation Age of Onset  . Heart disease Mother    Social History   Occupational History  . Glass blower/designer     retired  . [employer]     retired   Social History Main Topics  . Smoking status: Former Smoker    Years: 30.00  . Smokeless tobacco: Never Used     Comment: Quit at age 22  . Alcohol use No  . Drug use: No  . Sexual activity: Yes   Tobacco Counseling Counseling given: Not Answered   Activities of Daily Living In your present state of health, do you have any difficulty performing the following activities: 04/03/2016 08/29/2015  Hearing? Tempie Donning  Vision? N Y  Difficulty concentrating or making decisions? N N  Walking or climbing stairs? N N  Dressing or bathing? N N  Doing errands, shopping? N N  Preparing  Food and eating ? N -  Using the Toilet? N -  In the past six months, have you accidently leaked urine? N -  Do you have problems with loss of bowel control? N -  Managing your Medications? N -  Managing your Finances? N -  Housekeeping or managing your Housekeeping? N -  Some recent data might be hidden    Immunizations and Health Maintenance Immunization History  Administered Date(s) Administered  . Influenza Split 12/20/2009, 10/25/2011  . Influenza,inj,Quad PF,36+ Mos 01/24/2013, 12/23/2013  . Influenza-Unspecified 12/20/2014, 11/28/2015  . Pneumococcal Conjugate-13 08/07/2014  . Pneumococcal Polysaccharide-23 11/28/2015   Health Maintenance Due  Topic Date Due  . TETANUS/TDAP   11/05/1954  . COLONOSCOPY  01/31/2016    Patient Care Team: Gildardo Cranker, DO as PCP - General (Internal Medicine)  Indicate any recent Medical Services you may have received from other than [facility] providers in the past year (date may be approximate).    Assessment:   This is a routine wellness examination for [place].  Hearing/Vision screen Hearing Screening Comments: Pt. Had hearing checked at [place] in [place] about a year ago. Wears a L hearing aid. Vision Screening Comments: Pt. Had vision checked within the last 2 years at [facility]. Has bilateral cataracts, denies issues at this time.  Dietary issues and exercise activities discussed: Current Exercise Habits: The patient does not participate in regular exercise at present (Plans on starting walking back up when weather changes)  Goals    . Increase water intake          Starting 04/03/16 I will attempt to increase my water intake to 6 glasses a day.      Depression Screen PHQ 2/9 Scores 04/03/2016 08/29/2015 10/09/2014 05/18/2014  PHQ - 2 Score 0 0 0 0    Fall Risk Fall Risk  04/03/2016 02/22/2016 11/30/2015 08/29/2015 10/25/2014  Falls in the past year? No No No No No  Risk for fall due to : Impaired vision - - - -    Cognitive Function: MMSE - Mini Mental State Exam 04/03/2016 02/22/2016 08/29/2015 07/28/2014 05/11/2013  Orientation to time 5 5 5 4 5   Orientation to Place 4 5 5 5 4   Registration 3 3 3 3 3   Attention/ Calculation 5 5 5 5 5   Recall 3 1 0 2 2  Language- name 2 objects 2 2 2 2 2   Language- repeat 1 1 1 1 1   Language- follow 3 step command 3 3 3 3 3   Language- read & follow direction 1 1 1 1 1   Write a sentence 1 1 1 1 1   Copy design 1 1 1 1 1   Total score 29 28 27 28 28         Screening Tests Health Maintenance  Topic Date Due  . TETANUS/TDAP  11/05/1954  . COLONOSCOPY  01/31/2016  . INFLUENZA VACCINE  Completed  . PNA vac Low Risk Adult  Completed        Plan:  I have personally reviewed and addressed  the Medicare Annual Wellness questionnaire and have noted the following in the patient's chart:  A. Medical and social history B. Use of alcohol, tobacco or illicit drugs  C. Current medications and supplements D. Functional ability and status E.  Nutritional status F.  Physical activity G. Advance directives H. List of other physicians I.  Hospitalizations, surgeries, and ER visits in previous 12 months J.  Vitals K. Screenings to include hearing, vision, cognitive, depression  L. Referrals and appointments - none  In addition, I have reviewed and discussed with patient certain preventive protocols, quality metrics, and best practice recommendations. A written personalized care plan for preventive services as well as general preventive health recommendations were provided to patient.  See attached scanned questionnaire for additional information.   Signed,   Rich Reining, RN Health Advisor

## 2016-05-23 ENCOUNTER — Ambulatory Visit: Payer: Self-pay | Admitting: Internal Medicine

## 2016-06-18 ENCOUNTER — Ambulatory Visit: Payer: Self-pay | Admitting: Internal Medicine

## 2016-07-02 ENCOUNTER — Ambulatory Visit: Payer: Self-pay | Admitting: Internal Medicine

## 2016-07-30 ENCOUNTER — Encounter: Payer: Self-pay | Admitting: Internal Medicine

## 2016-07-30 ENCOUNTER — Ambulatory Visit (INDEPENDENT_AMBULATORY_CARE_PROVIDER_SITE_OTHER): Payer: Medicare PPO | Admitting: Internal Medicine

## 2016-07-30 VITALS — BP 132/78 | HR 46 | Temp 97.5°F | Ht 65.0 in | Wt 155.4 lb

## 2016-07-30 DIAGNOSIS — M4802 Spinal stenosis, cervical region: Secondary | ICD-10-CM | POA: Diagnosis not present

## 2016-07-30 DIAGNOSIS — J301 Allergic rhinitis due to pollen: Secondary | ICD-10-CM

## 2016-07-30 DIAGNOSIS — R001 Bradycardia, unspecified: Secondary | ICD-10-CM

## 2016-07-30 DIAGNOSIS — R42 Dizziness and giddiness: Secondary | ICD-10-CM

## 2016-07-30 DIAGNOSIS — E782 Mixed hyperlipidemia: Secondary | ICD-10-CM | POA: Diagnosis not present

## 2016-07-30 DIAGNOSIS — I10 Essential (primary) hypertension: Secondary | ICD-10-CM | POA: Diagnosis not present

## 2016-07-30 DIAGNOSIS — M2550 Pain in unspecified joint: Secondary | ICD-10-CM | POA: Diagnosis not present

## 2016-07-30 DIAGNOSIS — D649 Anemia, unspecified: Secondary | ICD-10-CM

## 2016-07-30 LAB — CBC WITH DIFFERENTIAL/PLATELET
Basophils Absolute: 55 cells/uL (ref 0–200)
Basophils Relative: 1 %
EOS ABS: 275 {cells}/uL (ref 15–500)
Eosinophils Relative: 5 %
HEMATOCRIT: 33.6 % — AB (ref 38.5–50.0)
Hemoglobin: 11.1 g/dL — ABNORMAL LOW (ref 13.2–17.1)
LYMPHS ABS: 1815 {cells}/uL (ref 850–3900)
LYMPHS PCT: 33 %
MCH: 28.8 pg (ref 27.0–33.0)
MCHC: 33 g/dL (ref 32.0–36.0)
MCV: 87.3 fL (ref 80.0–100.0)
MONO ABS: 220 {cells}/uL (ref 200–950)
MPV: 10.5 fL (ref 7.5–12.5)
Monocytes Relative: 4 %
NEUTROS PCT: 57 %
Neutro Abs: 3135 cells/uL (ref 1500–7800)
Platelets: 272 10*3/uL (ref 140–400)
RBC: 3.85 MIL/uL — ABNORMAL LOW (ref 4.20–5.80)
RDW: 13.9 % (ref 11.0–15.0)
WBC: 5.5 10*3/uL (ref 3.8–10.8)

## 2016-07-30 LAB — TSH: TSH: 1.52 mIU/L (ref 0.40–4.50)

## 2016-07-30 MED ORDER — MECLIZINE HCL 25 MG PO CHEW: 1.0000 | CHEWABLE_TABLET | Freq: Three times a day (TID) | ORAL | 3 refills | Status: DC | PRN

## 2016-07-30 NOTE — Progress Notes (Signed)
Patient ID: Troy Ortiz, male   DOB: [birth year], 81 y.o.   MRN: [id number]    Location:  [facility] Place of Service: OFFICE  Chief Complaint  Patient presents with  . Medical Management of Chronic Issues    3 month Routine Visit  . Other    patient complains of feeling lightheaded    HPI:  81 yo male seen today for f/u. He reports feeling lightheaded this AM. It occurred when he turned head too fast. Similar sx's last night. He takes loratidine daily for seasonal allergy. Today he took one of his wife's meclizine this AM which helped. He states he is only getting 1 table in the pillpack delivered to his home. He has nasal congestion and sinus pressure.  Hx CVA - He has balance issues x several yrs. 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 C spine MRI in 2010 showed multilevel DDD,  spinal stenosis severe at C4, C5 and C7. He has been missing several doses of ASA over the last several months  HTN - on amlodipine and lisinopril. Not taking  ASA daily as directed  Knee/neck/LBP - stable. C spine MRI in 2010 showed multilevel DDD,  spinal stenosis severe at C4, C5 and C7. He is out of tramadol and does not recall picking up Rx for diclofenac sent to pharmacy after last OV  Hyperlipidemia - stable on zocor. Admits to forgetting to take pill several times per week. Not taking ASA daily as directed  Allergic rhinitis - takes claritin prn. He has occasional nasal congestion  Constipation - has to use enema prn. He takes dulcolax prn. He takes a stool softener daily. Last colonoscopy in 2012 which revealed "2 subCM adenomas". He saw GI Dr Ardis Hughs in Dec 2017 and no further colonoscopies recommended due to age unless he starts having symptoms  Elevated PSA - followed by urology Dr Karsten Ro. PSA 25.26. He had  prostate bx rescheduled due to no one available to care for his wife who has dementia. He will f/u  in Mar 2018.  Hx anemia - stable.  Hgb 12.5   Past Medical History:  Diagnosis Date  . Allergic rhinitis, cause unspecified   . Allergy   . Arthritis   . Asthma    AS A CHILD  . Cervicalgia   . Conjunctivitis unspecified   . Dizziness and giddiness   . Enthesopathy of unspecified site   . Generalized hyperhidrosis   . Lumbago   . Neoplasm of uncertain behavior of skin   . Other abnormal glucose   . Other and unspecified hyperlipidemia   . Rash and other nonspecific skin eruption   . Reflux esophagitis   . Stroke [facility])    MINI STROKES  . Unspecified constipation   . Unspecified essential hypertension   . Unspecified late effects of cerebrovascular disease     Past Surgical History:  Procedure Laterality Date  . COLONOSCOPY      Patient Care Team: Gildardo Cranker, DO as PCP - General (Internal Medicine)  Social History   Social History  . Marital status: Married    Spouse name: N/A  . Number of children: 38  . Years of education: N/A   Occupational History  . Glass blower/designer     retired  . [employer]     retired   Social History Main Topics  . Smoking status: Former Smoker    Years: 30.00  .  Smokeless tobacco: Never Used     Comment: Quit at age 4  . Alcohol use No  . Drug use: No  . Sexual activity: Yes   Other Topics Concern  . Not on file   Social History Narrative  . No narrative on file     reports that he has quit smoking. He quit after 30.00 years of use. He has never used smokeless tobacco. He reports that he does not drink alcohol or use drugs.  Family History  Problem Relation Age of Onset  . Heart disease Mother    Family Status  Relation Status  . Mother Deceased  . Father Deceased       Left when pt was 61 years old  . Brother Deceased  . Daughter Alive  . Brother Deceased       Drowning accident in the [place]  . Son Alive  . Daughter Alive  . Daughter Alive  . Daughter Alive  . Daughter Alive     No Known  Allergies  Medications: Patient's Medications  New Prescriptions   No medications on file  Previous Medications   AMLODIPINE (NORVASC) 10 MG TABLET    Take 1 tablet by mouth once daily   ASPIRIN 81 MG TABLET    Take 1 tablet (81 mg total) by mouth daily.   DICLOFENAC (VOLTAREN) 75 MG EC TABLET    Take 1 tablet (75 mg total) by mouth 2 (two) times daily.   LISINOPRIL (PRINIVIL,ZESTRIL) 5 MG TABLET    Take 1 tablet (5 mg total) by mouth daily. for high blood pressure   LORATADINE (CLARITIN) 10 MG TABLET    Take 1 tablet (10 mg total) by mouth daily.   MECLIZINE HCL 25 MG CHEW    Chew 1 tablet (25 mg total) by mouth 2 (two) times daily.   OMEPRAZOLE (PRILOSEC) 20 MG CAPSULE    Take 20 mg by mouth daily.   SENNA-DOCUSATE (SENOKOT-S) 8.6-50 MG PER TABLET    Take 1 tablet by mouth daily.   SIMVASTATIN (ZOCOR) 80 MG TABLET    Take one-half tablet by mouth at bedtime  Modified Medications   No medications on file  Discontinued Medications   No medications on file    Review of Systems  Vitals:   07/30/16 1308  BP: 132/78  Pulse: (!) 46  Temp: 97.5 F (36.4 C)  TempSrc: Oral  SpO2: 94%  Weight: 155 lb 6.4 oz (70.5 kg)  Height: '5\' 5"'$  (1.651 m)   Body mass index is 25.86 kg/m.  Physical Exam  Constitutional: He appears well-developed and well-nourished.  HOH; appears confused;  in NAD  HENT:  Mouth/Throat: Oropharynx is clear and moist.  MMM; no oral thrush  Eyes: Pupils are equal, round, and reactive to light. No scleral icterus.  Neck: Neck supple. Muscular tenderness present. No spinous process tenderness present. Carotid bruit is not present. Edema and decreased range of motion present. No thyromegaly present.  Cardiovascular: Regular rhythm and intact distal pulses.  Bradycardia present.  Exam reveals no gallop and no friction rub.   Murmur (1/6 SEM) heard. no distal LE swelling. No calf TTP Rate '@44'$  bpm  Pulmonary/Chest: Effort normal and breath sounds normal. He has no  wheezes. He has no rales. He exhibits no tenderness.  Abdominal: Soft. Bowel sounds are normal. He exhibits no distension, no abdominal bruit, no pulsatile midline mass and no mass. There is no tenderness. There is no rebound and no guarding.  Musculoskeletal: He exhibits  edema and tenderness.  Lymphadenopathy:    He has no cervical adenopathy.  Neurological: He is alert.  Skin: Skin is warm and dry. No rash noted.  Psychiatric: He has a normal mood and affect. His behavior is normal.     Labs reviewed: No visits with results within 3 Month(s) from this visit.  Latest known visit with results is:  Office Visit on 02/22/2016  Component Date Value Ref Range Status  . Sodium 02/22/2016 140  135 - 146 mmol/L Final  . Potassium 02/22/2016 4.0  3.5 - 5.3 mmol/L Final  . Chloride 02/22/2016 103  98 - 110 mmol/L Final  . CO2 02/22/2016 30  20 - 31 mmol/L Final  . Glucose, Bld 02/22/2016 89  65 - 99 mg/dL Final  . BUN 38/46/2634 18  7 - 25 mg/dL Final  . Creat 69/16/9155 0.76  0.70 - 1.18 mg/dL Final   Comment:   For patients > or = 79 years of age: The upper reference limit for Creatinine is approximately 13% higher for people identified as African-American.     . Total Bilirubin 02/22/2016 0.3  0.2 - 1.2 mg/dL Final  . Alkaline Phosphatase 02/22/2016 48  40 - 115 U/L Final  . AST 02/22/2016 17  10 - 35 U/L Final  . ALT 02/22/2016 7* 9 - 46 U/L Final  . Total Protein 02/22/2016 6.9  6.1 - 8.1 g/dL Final  . Albumin 78/26/9706 4.0  3.6 - 5.1 g/dL Final  . Calcium 88/48/2499 9.5  8.6 - 10.3 mg/dL Final  . GFR, Est African American 02/22/2016 >89  >=60 mL/min Final  . GFR, Est Non African American 02/22/2016 87  >=60 mL/min Final  . Cholesterol 02/22/2016 241* <200 mg/dL Final  . Triglycerides 02/22/2016 80  <150 mg/dL Final  . HDL 33/52/6391 67  >40 mg/dL Final  . Total CHOL/HDL Ratio 02/22/2016 3.6  <4.7 Ratio Final  . VLDL 02/22/2016 16  <30 mg/dL Final  . LDL Cholesterol 02/22/2016  158* <100 mg/dL Final  . WBC 78/87/3998 5.9  3.8 - 10.8 K/uL Final  . RBC 02/22/2016 4.29  4.20 - 5.80 MIL/uL Final  . Hemoglobin 02/22/2016 12.5* 13.2 - 17.1 g/dL Final  . HCT 44/91/4075 37.4* 38.5 - 50.0 % Final  . MCV 02/22/2016 87.2  80.0 - 100.0 fL Final  . MCH 02/22/2016 29.1  27.0 - 33.0 pg Final  . MCHC 02/22/2016 33.4  32.0 - 36.0 g/dL Final  . RDW 90/05/3517 14.2  11.0 - 15.0 % Final  . Platelets 02/22/2016 282  140 - 400 K/uL Final  . MPV 02/22/2016 10.1  7.5 - 12.5 fL Final  . Neutro Abs 02/22/2016 2596  1,500 - 7,800 cells/uL Final  . Lymphs Abs 02/22/2016 2478  850 - 3,900 cells/uL Final  . Monocytes Absolute 02/22/2016 354  200 - 950 cells/uL Final  . Eosinophils Absolute 02/22/2016 413  15 - 500 cells/uL Final  . Basophils Absolute 02/22/2016 59  0 - 200 cells/uL Final  . Neutrophils Relative % 02/22/2016 44  % Final  . Lymphocytes Relative 02/22/2016 42  % Final  . Monocytes Relative 02/22/2016 6  % Final  . Eosinophils Relative 02/22/2016 7  % Final  . Basophils Relative 02/22/2016 1  % Final  . Smear Review 02/22/2016 Criteria for review not met   Final    No results found.  ECG OBTAINED AND REVIEWED BY MYSELF: Sb @ 48 bpm, nml axis, LAE, poor R wave progression, NSST changes. No  acute ischemic changes. No changes since 08/2015  Assessment/Plan   ICD-10-CM   1. Bradycardia R00.1 BMP with eGFR    ALT    TSH    CBC with Differential/Platelets    Ambulatory referral to Cardiology    EKG 12-Lead   not on rate limiting medication  2. Dizziness R42 BMP with eGFR    ALT    Ambulatory referral to Cardiology    Meclizine HCl 25 MG CHEW  3. Seasonal allergic rhinitis due to pollen J30.1   4. Essential hypertension, benign I10 Ambulatory referral to Cardiology  5. Spinal stenosis of cervical region M48.02   6. Mixed hyperlipidemia E78.2 Lipid Panel  7. Pain, joint, multiple sites M25.50   8. Anemia, unspecified type D64.9 CBC with Differential/Platelets    Last  2 D echo revealed EF 55-60%; Mod LVH. Due to bradycardia, will refer to cardio for further w/u. May need event monitor vs holter monitor to assess  Handout on bradycardia given  NO FURTHER COLONOSCOPIES NEEDED DUE TO AGE UNLESS YOU HAVE SYMPTOMS (CHANGE IN BOWEL HABITS, BLEEDING, ETC)  Continue current medications as ordered  Will call with lab results  Follow up in 3 mos for dizziness, HTN, bradycardia   Reco Shonk S. Perlie Gold  [facility] [address] [place], [place] [address] [phone] Cell (Monday-Friday 8 AM - 5 PM) [phone] After 5 PM and follow prompts

## 2016-07-30 NOTE — Patient Instructions (Addendum)
NO FURTHER COLONOSCOPIES NEEDED DUE TO AGE UNLESS YOU HAVE SYMPTOMS (CHANGE IN BOWEL HABITS, BLEEDING, ETC)  Continue current medications as ordered  Will call with lab results  Follow up in 3 mos for dizziness, HTN, bradycardia  Bradycardia, Adult Bradycardia is a slower-than-normal heartbeat. A normal resting heart rate for an adult ranges from 60 to 100 beats per minute. With bradycardia, the resting heart rate is less than 60 beats per minute. Bradycardia can prevent enough oxygen from reaching certain areas of your body when you are active. It can be serious if it keeps enough oxygen from reaching your brain and other parts of your body. Bradycardia is not a problem for everyone. For some healthy adults, a slow resting heart rate is normal. What are the causes? This condition may be caused by:  A problem with the heart, including: ? A problem with the heart's electrical system, such as a heart block. ? A problem with the heart's natural pacemaker (sinus node). ? Heart disease. ? A heart attack. ? Heart damage. ? A heart infection. ? A heart condition that is present at birth (congenital heart defect).  Certain medicines that treat heart conditions.  Certain conditions, such as hypothyroidism and obstructive sleep apnea.  Problems with the balance of chemicals and other substances, like potassium, in the blood.  What increases the risk? This condition is more likely to develop in adults who:  Are age 41 or older.  Have high blood pressure (hypertension), high cholesterol (hyperlipidemia), or diabetes.  Drink heavily, use tobacco or nicotine products, or use drugs.  Are stressed.  What are the signs or symptoms? Symptoms of this condition include:  Light-headedness.  Feeling faint or fainting.  Fatigue and weakness.  Shortness of breath.  Chest pain (angina).  Drowsiness.  Confusion.  Dizziness.  How is this diagnosed? This condition may be diagnosed  based on:  Your symptoms.  Your medical history.  A physical exam.  During the exam, your health care provider will listen to your heartbeat and check your pulse. To confirm the diagnosis, your health care provider may order tests, such as:  Blood tests.  An electrocardiogram (ECG). This test records the heart's electrical activity. The test can show how fast your heart is beating and whether the heartbeat is steady.  A test in which you wear a portable device (event recorder or Holter monitor) to record your heart's electrical activity while you go about your day.  Anexercise test.  How is this treated? Treatment for this condition depends on the cause of the condition and how severe your symptoms are. Treatment may involve:  Treatment of the underlying condition.  Changing your medicines or how much medicine you take.  Having a small, battery-operated device called a pacemaker implanted under the skin. When bradycardia occurs, this device can be used to increase your heart rate and help your heart to beat in a regular rhythm.  Follow these instructions at home: Lifestyle   Manage any health conditions that contribute to bradycardia as told by your health care provider.  Follow a heart-healthy diet. A nutrition specialist (dietitian) can help to educate you about healthy food options and changes.  Follow an exercise program that is approved by your health care provider.  Maintain a healthy weight.  Try to reduce or manage your stress, such as with yoga or meditation. If you need help reducing stress, ask your health care provider.  Do not use use any products that contain nicotine or tobacco,  such as cigarettes and e-cigarettes. If you need help quitting, ask your health care provider.  Do not use illegal drugs.  Limit alcohol intake to no more than 1 drink per day for nonpregnant women and 2 drinks per day for men. One drink equals 12 oz of beer, 5 oz of wine, or 1 oz  of hard liquor. General instructions  Take over-the-counter and prescription medicines only as told by your health care provider.  Keep all follow-up visits as directed by your health care provider. This is important. How is this prevented? In some cases, bradycardia may be prevented by:  Treating underlying medical problems.  Stopping behaviors or medicines that can trigger the condition.  Contact a health care provider if:  You feel light-headed or dizzy.  You almost faint.  You feel weak or are easily fatigued during physical activity.  You experience confusion or have memory problems. Get help right away if:  You faint.  You have an irregular heartbeat (palpitations).  You have chest pain.  You have trouble breathing. This information is not intended to replace advice given to you by your health care provider. Make sure you discuss any questions you have with your health care provider. Document Released: 10/12/2001 Document Revised: 09/18/2015 Document Reviewed: 07/12/2015 Elsevier Interactive Patient Education  2017 [employer].

## 2016-07-31 LAB — BASIC METABOLIC PANEL WITH GFR
BUN: 16 mg/dL (ref 7–25)
CHLORIDE: 106 mmol/L (ref 98–110)
CO2: 27 mmol/L (ref 20–31)
CREATININE: 0.84 mg/dL (ref 0.70–1.18)
Calcium: 8.9 mg/dL (ref 8.6–10.3)
GFR, EST NON AFRICAN AMERICAN: 84 mL/min (ref >=60)
GFR, Est African American: >89 mL/min (ref >=60)
Glucose, Bld: 93 mg/dL (ref 65–99)
Potassium: 4 mmol/L (ref 3.5–5.3)
SODIUM: 141 mmol/L (ref 135–146)

## 2016-07-31 LAB — ALT: ALT: 6 U/L — AB (ref 9–46)

## 2016-07-31 LAB — LIPID PANEL
CHOL/HDL RATIO: 3.7 ratio (ref <5.0)
CHOLESTEROL: 202 mg/dL — AB (ref <200)
HDL: 55 mg/dL (ref >40)
LDL Cholesterol: 125 mg/dL — ABNORMAL HIGH (ref <100)
TRIGLYCERIDES: 108 mg/dL (ref <150)
VLDL: 22 mg/dL (ref <30)

## 2016-08-05 ENCOUNTER — Ambulatory Visit (INDEPENDENT_AMBULATORY_CARE_PROVIDER_SITE_OTHER): Payer: Medicare PPO | Admitting: Cardiovascular Disease

## 2016-08-05 ENCOUNTER — Encounter: Payer: Self-pay | Admitting: Cardiovascular Disease

## 2016-08-05 VITALS — BP 162/70 | HR 49 | Ht 65.0 in | Wt 158.2 lb

## 2016-08-05 DIAGNOSIS — R001 Bradycardia, unspecified: Secondary | ICD-10-CM | POA: Diagnosis not present

## 2016-08-05 DIAGNOSIS — E782 Mixed hyperlipidemia: Secondary | ICD-10-CM

## 2016-08-05 DIAGNOSIS — R42 Dizziness and giddiness: Secondary | ICD-10-CM | POA: Diagnosis not present

## 2016-08-05 DIAGNOSIS — I10 Essential (primary) hypertension: Secondary | ICD-10-CM | POA: Diagnosis not present

## 2016-08-05 NOTE — Assessment & Plan Note (Signed)
History of hyperlipidemia on statin therapy with recent lipid profile performed 07/30/16 revealing a total cholesterol 202, LDL 125 and LDL of 55.

## 2016-08-05 NOTE — Assessment & Plan Note (Signed)
Troy Ortiz was referred by his PCP for bradycardia. He is on no rate slowing drugs. He saw Dr. Johnsie Cancel in the office 2 years ago which time his heart rate was slow as well. He really denies syncope or presyncope. At this point, I see no reason to further evaluate this.

## 2016-08-05 NOTE — Progress Notes (Signed)
08/05/2016 Kendal Hymen   15-Jun-1935  [id number]  Primary Physician Gildardo Cranker, DO Primary Cardiologist: Lorretta Harp MD Renae Gloss  HPI:  Mr. Borntreger is a 81 year old thin and somewhat frail appearing married African-American male father of 70 children referred by Gildardo Cranker DO for evaluation of bradycardia. He does have a history of hypertension and hyperlipidemia. Never had a heart attack or stroke. He did see Dr. Johnsie Cancel in the office 10/05/14 for an abnormal EKG. That time his heart rate was in the low 50 range. He gets occasional dizziness, mostly when he moves his head quickly. There is no history of syncope or presyncope.   Current Outpatient Prescriptions  Medication Sig Dispense Refill  . amLODipine (NORVASC) 10 MG tablet Take 1 tablet by mouth once daily 90 tablet 3  . aspirin 81 MG tablet Take 1 tablet (81 mg total) by mouth daily. 90 tablet 3  . diclofenac (VOLTAREN) 75 MG EC tablet Take 1 tablet (75 mg total) by mouth 2 (two) times daily. 60 tablet 3  . lisinopril (PRINIVIL,ZESTRIL) 5 MG tablet Take 1 tablet (5 mg total) by mouth daily. for high blood pressure 90 tablet 3  . loratadine (CLARITIN) 10 MG tablet Take 1 tablet (10 mg total) by mouth daily. 90 tablet 3  . Meclizine HCl 25 MG CHEW Chew 1 tablet (25 mg total) by mouth 3 (three) times daily as needed (dizziness). 90 each 3  . omeprazole (PRILOSEC) 20 MG capsule Take 20 mg by mouth daily.    Marland Kitchen senna-docusate (SENOKOT-S) 8.6-50 MG per tablet Take 1 tablet by mouth daily. 30 tablet 3  . simvastatin (ZOCOR) 80 MG tablet Take one-half tablet by mouth at bedtime 45 tablet 3   No current facility-administered medications for this visit.     No Known Allergies  Social History   Social History  . Marital status: Married    Spouse name: N/A  . Number of children: 66  . Years of education: N/A   Occupational History  . Glass blower/designer     retired  . [employer]     retired   Social History Main  Topics  . Smoking status: Former Smoker    Years: 30.00  . Smokeless tobacco: Never Used     Comment: Quit at age 26  . Alcohol use No  . Drug use: No  . Sexual activity: Yes   Other Topics Concern  . Not on file   Social History Narrative  . No narrative on file     Review of Systems: General: negative for chills, fever, night sweats or weight changes.  Cardiovascular: negative for chest pain, dyspnea on exertion, edema, orthopnea, palpitations, paroxysmal nocturnal dyspnea or shortness of breath Dermatological: negative for rash Respiratory: negative for cough or wheezing Urologic: negative for hematuria Abdominal: negative for nausea, vomiting, diarrhea, bright red blood per rectum, melena, or hematemesis Neurologic: negative for visual changes, syncope, or dizziness All other systems reviewed and are otherwise negative except as noted above.    Blood pressure (!) 162/70, pulse (!) 49, height 5\' 5"  (1.651 m), weight 158 lb 3.2 oz (71.8 kg).  General appearance: alert and no distress Neck: no adenopathy, no carotid bruit, no JVD, supple, symmetrical, trachea midline and thyroid not enlarged, symmetric, no tenderness/mass/nodules Lungs: clear to auscultation bilaterally Heart: regular rate and rhythm, S1, S2 normal, no murmur, click, rub or gallop Extremities: extremities normal, atraumatic, no cyanosis or edema  EKG sinus bradycardia 49 with  evidence of LVH voltage and nonspecific ST and T-wave changes. I  Personally reviewed this EKG.  ASSESSMENT AND PLAN:   Slow heart rate Mr. Skare was referred by his PCP for bradycardia. He is on no rate slowing drugs. He saw Dr. Johnsie Cancel in the office 2 years ago which time his heart rate was slow as well. He really denies syncope or presyncope. At this point, I see no reason to further evaluate this.  Mixed hyperlipidemia History of hyperlipidemia on statin therapy with recent lipid profile performed 07/30/16 revealing a total  cholesterol 202, LDL 125 and LDL of 55.      Lorretta Harp MD [place], [facility] 08/05/2016 4:06 PM

## 2016-08-05 NOTE — Patient Instructions (Signed)
Medication Instructions: Your physician recommends that you continue on your current medications as directed. Please refer to the Current Medication list given to you today.   Follow-Up: Your physician recommends that you schedule a follow-up appointment as needed with Dr. Berry.    

## 2016-10-21 ENCOUNTER — Ambulatory Visit (INDEPENDENT_AMBULATORY_CARE_PROVIDER_SITE_OTHER): Payer: Medicare PPO | Admitting: Internal Medicine

## 2016-10-21 ENCOUNTER — Encounter: Payer: Self-pay | Admitting: Internal Medicine

## 2016-10-21 VITALS — BP 120/60 | HR 52 | Temp 98.1°F | Resp 18 | Ht 65.0 in | Wt 149.6 lb

## 2016-10-21 DIAGNOSIS — M2550 Pain in unspecified joint: Secondary | ICD-10-CM | POA: Diagnosis not present

## 2016-10-21 DIAGNOSIS — R42 Dizziness and giddiness: Secondary | ICD-10-CM

## 2016-10-21 DIAGNOSIS — D649 Anemia, unspecified: Secondary | ICD-10-CM | POA: Diagnosis not present

## 2016-10-21 DIAGNOSIS — M4802 Spinal stenosis, cervical region: Secondary | ICD-10-CM

## 2016-10-21 DIAGNOSIS — I10 Essential (primary) hypertension: Secondary | ICD-10-CM

## 2016-10-21 DIAGNOSIS — Z79899 Other long term (current) drug therapy: Secondary | ICD-10-CM

## 2016-10-21 DIAGNOSIS — E782 Mixed hyperlipidemia: Secondary | ICD-10-CM

## 2016-10-21 NOTE — Progress Notes (Signed)
Patient ID: Troy Ortiz, male   DOB: May 18, 1935, 81 y.o.   MRN: [id number]   Location:  [facility] clinic  Provider:   Code Status:  Goals of Care:  Advanced Directives 10/21/2016  Does Patient Have a Medical Advance Directive? No  Type of Advance Directive -  Does patient want to make changes to medical advance directive? -  Copy of [place] in Chart? -  Would patient like information on creating a medical advance directive? Yes (ED - Information included in AVS)     Chief Complaint  Patient presents with  . Medical Management of Chronic Issues    3 mo f/u for dizziness    HPI: Patient is a 81 y.o. male seen today for medical management of dizziness and bradycardia. Mr. Demaree states he has not had many issues with dizziness and has had to take his meclizine once or twice a day. He says that he had some teeth pulled on upper right side of jaw. Mr. Klemens states that he hasn't been able to chew his food very well. He denies pain.  Past Medical History:  Diagnosis Date  . Allergic rhinitis, cause unspecified   . Allergy   . Arthritis   . Asthma    AS A CHILD  . Cervicalgia   . Conjunctivitis unspecified   . Dizziness and giddiness   . Enthesopathy of unspecified site   . Generalized hyperhidrosis   . Lumbago   . Neoplasm of uncertain behavior of skin   . Other abnormal glucose   . Other and unspecified hyperlipidemia   . Rash and other nonspecific skin eruption   . Reflux esophagitis   . Stroke [facility])    MINI STROKES  . Unspecified constipation   . Unspecified essential hypertension   . Unspecified late effects of cerebrovascular disease     Past Surgical History:  Procedure Laterality Date  . COLONOSCOPY      No Known Allergies  Outpatient Encounter Prescriptions as of 10/21/2016  Medication Sig  . amLODipine (NORVASC) 10 MG tablet Take 1 tablet by mouth once daily  . aspirin 81 MG tablet Take 1 tablet (81 mg total) by mouth daily.  .  diclofenac (VOLTAREN) 75 MG EC tablet Take 1 tablet (75 mg total) by mouth 2 (two) times daily.  Marland Kitchen lisinopril (PRINIVIL,ZESTRIL) 5 MG tablet Take 1 tablet (5 mg total) by mouth daily. for high blood pressure  . loratadine (CLARITIN) 10 MG tablet Take 1 tablet (10 mg total) by mouth daily.  . Meclizine HCl 25 MG CHEW Chew 1 tablet (25 mg total) by mouth 3 (three) times daily as needed (dizziness).  Marland Kitchen omeprazole (PRILOSEC) 20 MG capsule Take 20 mg by mouth daily.  Marland Kitchen senna-docusate (SENOKOT-S) 8.6-50 MG per tablet Take 1 tablet by mouth daily.  . simvastatin (ZOCOR) 80 MG tablet Take one-half tablet by mouth at bedtime   No facility-administered encounter medications on file as of 10/21/2016.     Review of Systems:  Review of Systems  Constitutional: Positive for unexpected weight change. Negative for activity change, appetite change, fatigue and fever.  HENT: Positive for dental problem (Teeth pulled 2 weeks ago.). Negative for congestion, drooling, ear discharge, ear pain, facial swelling, hearing loss, mouth sores, postnasal drip, rhinorrhea, sinus pain, sinus pressure, sneezing, sore throat, tinnitus and trouble swallowing.   Eyes: Negative for photophobia, pain, discharge, redness, itching and visual disturbance.  Respiratory: Negative for apnea, cough, choking, chest tightness, shortness of breath and wheezing.  Cardiovascular: Negative for chest pain, palpitations and leg swelling.  Gastrointestinal: Negative for abdominal distention, abdominal pain, anal bleeding, constipation, diarrhea, nausea and vomiting.  Genitourinary: Negative for decreased urine volume, difficulty urinating, dysuria, flank pain, frequency, hematuria and urgency.  Neurological: Positive for dizziness. Negative for facial asymmetry, speech difficulty, weakness, light-headedness, numbness and headaches.  Hematological: Negative for adenopathy.    Health Maintenance  Topic Date Due  . COLONOSCOPY  01/31/2016  .  TETANUS/TDAP  07/19/2020 (Originally 11/05/1954)  . INFLUENZA VACCINE  Completed  . PNA vac Low Risk Adult  Completed    Physical Exam: Vitals:   10/21/16 1413  BP: 120/60  Pulse: (!) 52  Resp: 18  Temp: 98.1 F (36.7 C)  TempSrc: Oral  SpO2: 96%  Weight: 149 lb 9.6 oz (67.9 kg)  Height: 5\' 5"  (1.651 m)   Body mass index is 24.89 kg/m. Physical Exam  Constitutional: He is oriented to person, place, and time. He appears well-developed and well-nourished. No distress.  HENT:  Head: Normocephalic.  Nose: Nose normal.  Mouth/Throat: Oropharynx is clear and moist.  Eyes: Pupils are equal, round, and reactive to light. Conjunctivae and EOM are normal. Right eye exhibits no discharge. Left eye exhibits no discharge. No scleral icterus.  Neck: Normal range of motion. Neck supple.  Cardiovascular: Regular rhythm and normal heart sounds.  Bradycardia present.  Exam reveals no gallop and no friction rub.   No murmur heard. Pulmonary/Chest: Breath sounds normal. No respiratory distress. He has no wheezes. He has no rales. He exhibits no tenderness.  Abdominal: Soft. Bowel sounds are normal. He exhibits no distension and no mass. There is no tenderness. There is no rebound and no guarding.  Lymphadenopathy:    He has no cervical adenopathy.  Neurological: He is alert and oriented to person, place, and time. Coordination normal.    Labs reviewed: Basic Metabolic Panel:  Recent Labs  02/22/16 1642 07/30/16 1418  NA 140 141  K 4.0 4.0  CL 103 106  CO2 30 27  GLUCOSE 89 93  BUN 18 16  CREATININE 0.76 0.84  CALCIUM 9.5 8.9  TSH  --  1.52   Liver Function Tests:  Recent Labs  02/22/16 1642 07/30/16 1418  AST 17  --   ALT 7* 6*  ALKPHOS 48  --   BILITOT 0.3  --   PROT 6.9  --   ALBUMIN 4.0  --    No results for input(s): LIPASE, AMYLASE in the last 8760 hours. No results for input(s): AMMONIA in the last 8760 hours. CBC:  Recent Labs  02/22/16 1642 07/30/16 1418    WBC 5.9 5.5  NEUTROABS 2,596 3,135  HGB 12.5* 11.1*  HCT 37.4* 33.6*  MCV 87.2 87.3  PLT 282 272   Lipid Panel:  Recent Labs  02/22/16 1642 07/30/16 1418  CHOL 241* 202*  HDL 67 55  LDLCALC 158* 125*  TRIG 80 108  CHOLHDL 3.6 3.7   Lab Results  Component Value Date   HGBA1C (H) 08/08/2008    6.2 (NOTE) The ADA recommends the following therapeutic goal for glycemic control related to Hgb A1c measurement: Goal of therapy: <6.5 Hgb A1c  Reference: American Diabetes Association: Clinical Practice Recommendations 2010, Diabetes Care, 2010, 33: (Suppl  1).    Procedures since last visit: No results found.  Assessment/Plan Labs:  CBC w/diff BMP  Call with lab results.  Instructed patient to supplement his diet with Boost or Ensure due to weight loss.  Patient to continue with normal prescriptions and follow up in 6 weeks to follow up for weight loss.    Labs/tests ordered:  @ORDERS @ Next appt:  Visit date not found

## 2016-10-21 NOTE — Progress Notes (Signed)
Patient ID: Troy Ortiz, male   DOB: 04/06/1935, 81 y.o.   MRN: [id number]    Location:  [facility] Place of Service: OFFICE  Chief Complaint  Patient presents with  . Medical Management of Chronic Issues    3 mo f/u for dizziness    HPI:  81 yo male seen today for f/u. He had 2 teeth removed recently and plans to have several more removed in the near future. He has difficulty chewing and has lost some wt due to decreased po intake.   He has occasional dizziness that resolves with meclizine.   Bradycardia - he saw cardio Dr Gwenlyn Found. No further w/u recommended. He denies syncope or presyncope.  Hx CVA - He has balance issues and intermittent dizziness x several yrs. 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 C spine MRI in 2010 showed multilevel DDD,  spinal stenosis severe at C4, C5 and C7. He has been missing several doses of ASA over the last several months  HTN - stable on amlodipine and lisinopril. Takes ASA daily  Knee/neck/LBP - stable. C spine MRI in 2010 showed multilevel DDD,  spinal stenosis severe at C4, C5 and C7. He is out of tramadol and does not recall picking up Rx for diclofenac sent to pharmacy after last OV  Hyperlipidemia - stable on zocor. Admits to forgetting to take pill several times per week. Not taking ASA daily as directed  Allergic rhinitis - stable. takes claritin prn. He has occasional nasal congestion  Constipation - has to use enema prn. He takes dulcolax prn. He takes a stool softener daily. Last colonoscopy in 2012 which revealed "2 subCM adenomas". He saw GI Dr Ardis Hughs in Dec 2017 and no further colonoscopies recommended due to age unless he starts having symptoms  Elevated PSA - followed by urology Dr Karsten Ro. PSA 25.26. He had  prostate bx rescheduled due to no one available to care for his wife who has dementia.   Hx anemia - stable.  Hgb 11.1  Past Medical  History:  Diagnosis Date  . Allergic rhinitis, cause unspecified   . Allergy   . Arthritis   . Asthma    AS A CHILD  . Cervicalgia   . Conjunctivitis unspecified   . Dizziness and giddiness   . Enthesopathy of unspecified site   . Generalized hyperhidrosis   . Lumbago   . Neoplasm of uncertain behavior of skin   . Other abnormal glucose   . Other and unspecified hyperlipidemia   . Rash and other nonspecific skin eruption   . Reflux esophagitis   . Stroke [facility])    MINI STROKES  . Unspecified constipation   . Unspecified essential hypertension   . Unspecified late effects of cerebrovascular disease     Past Surgical History:  Procedure Laterality Date  . COLONOSCOPY      Patient Care Team: Gildardo Cranker, DO as PCP - General (Internal Medicine)  Social History   Social History  . Marital status: Married    Spouse name: N/A  . Number of children: 65  . Years of education: N/A   Occupational History  . Glass blower/designer     retired  . army     retired   Social History Main Topics  . Smoking status: Former Smoker    Years: 30.00  . Smokeless tobacco: Never Used     Comment: Quit at age 45  .  Alcohol use No  . Drug use: No  . Sexual activity: Yes   Other Topics Concern  . Not on file   Social History Narrative  . No narrative on file     reports that he has quit smoking. He quit after 30.00 years of use. He has never used smokeless tobacco. He reports that he does not drink alcohol or use drugs.  Family History  Problem Relation Age of Onset  . Heart disease Mother    Family Status  Relation Status  . Mother Deceased  . Father Deceased       Left when pt was 72 years old  . Brother Deceased  . Daughter Alive  . Brother Deceased       Drowning accident in the [place]  . Son Alive  . Daughter Alive  . Daughter Alive  . Daughter Alive  . Daughter Alive     No Known Allergies  Medications: Patient's Medications  New Prescriptions   No  medications on file  Previous Medications   AMLODIPINE (NORVASC) 10 MG TABLET    Take 1 tablet by mouth once daily   ASPIRIN 81 MG TABLET    Take 1 tablet (81 mg total) by mouth daily.   DICLOFENAC (VOLTAREN) 75 MG EC TABLET    Take 1 tablet (75 mg total) by mouth 2 (two) times daily.   LISINOPRIL (PRINIVIL,ZESTRIL) 5 MG TABLET    Take 1 tablet (5 mg total) by mouth daily. for high blood pressure   LORATADINE (CLARITIN) 10 MG TABLET    Take 1 tablet (10 mg total) by mouth daily.   MECLIZINE HCL 25 MG CHEW    Chew 1 tablet (25 mg total) by mouth 3 (three) times daily as needed (dizziness).   OMEPRAZOLE (PRILOSEC) 20 MG CAPSULE    Take 20 mg by mouth daily.   SENNA-DOCUSATE (SENOKOT-S) 8.6-50 MG PER TABLET    Take 1 tablet by mouth daily.   SIMVASTATIN (ZOCOR) 80 MG TABLET    Take one-half tablet by mouth at bedtime  Modified Medications   No medications on file  Discontinued Medications   No medications on file    Review of Systems  Neurological: Positive for dizziness.  All other systems reviewed and are negative.   Vitals:   10/21/16 1413  BP: 120/60  Pulse: (!) 52  Resp: 18  Temp: 98.1 F (36.7 C)  TempSrc: Oral  SpO2: 96%  Weight: 149 lb 9.6 oz (67.9 kg)  Height: 5' 5" (1.651 m)   Body mass index is 24.89 kg/m.  Physical Exam  Constitutional: He is oriented to person, place, and time. He appears well-developed and well-nourished.  HENT:  Mouth/Throat: Oropharynx is clear and moist.  MMM; no oral thrush  Eyes: Pupils are equal, round, and reactive to light. No scleral icterus.  Neck: Neck supple. Carotid bruit is not present.  Cardiovascular: Regular rhythm and intact distal pulses.  Bradycardia present.  Exam reveals no gallop and no friction rub.   Murmur (1/6 SEM) heard. No distal LE edema. No calf TTP  Pulmonary/Chest: Effort normal and breath sounds normal. No respiratory distress. He has no wheezes. He has no rales. He exhibits no tenderness.  Abdominal: Soft.  Normal appearance and bowel sounds are normal. He exhibits no distension, no abdominal bruit, no pulsatile midline mass and no mass. There is no hepatomegaly. There is no tenderness. There is no rigidity, no rebound and no guarding. No hernia.  Musculoskeletal: He exhibits edema.  Lymphadenopathy:    He has no cervical adenopathy.  Neurological: He is alert and oriented to person, place, and time.  Skin: Skin is warm and dry. No rash noted.  Psychiatric: He has a normal mood and affect. His behavior is normal. Judgment and thought content normal.     Labs reviewed: Office Visit on 07/30/2016  Component Date Value Ref Range Status  . Sodium 07/30/2016 141  135 - 146 mmol/L Final  . Potassium 07/30/2016 4.0  3.5 - 5.3 mmol/L Final  . Chloride 07/30/2016 106  98 - 110 mmol/L Final  . CO2 07/30/2016 27  20 - 31 mmol/L Final  . Glucose, Bld 07/30/2016 93  65 - 99 mg/dL Final  . BUN 07/30/2016 16  7 - 25 mg/dL Final  . Creat 07/30/2016 0.84  0.70 - 1.18 mg/dL Final   Comment:   For patients > or = 79 years of age: The upper reference limit for Creatinine is approximately 13% higher for people identified as African-American.     . Calcium 07/30/2016 8.9  8.6 - 10.3 mg/dL Final  . GFR, Est African American 07/30/2016 >89  >=60 mL/min Final  . GFR, Est Non African American 07/30/2016 84  >=60 mL/min Final  . ALT 07/30/2016 6* 9 - 46 U/L Final  . Cholesterol 07/30/2016 202* <200 mg/dL Final  . Triglycerides 07/30/2016 108  <150 mg/dL Final  . HDL 07/30/2016 55  >40 mg/dL Final  . Total CHOL/HDL Ratio 07/30/2016 3.7  <5.0 Ratio Final  . VLDL 07/30/2016 22  <30 mg/dL Final  . LDL Cholesterol 07/30/2016 125* <100 mg/dL Final  . TSH 07/30/2016 1.52  0.40 - 4.50 mIU/L Final  . WBC 07/30/2016 5.5  3.8 - 10.8 K/uL Final  . RBC 07/30/2016 3.85* 4.20 - 5.80 MIL/uL Final  . Hemoglobin 07/30/2016 11.1* 13.2 - 17.1 g/dL Final  . HCT 07/30/2016 33.6* 38.5 - 50.0 % Final  . MCV 07/30/2016 87.3  80.0  - 100.0 fL Final  . MCH 07/30/2016 28.8  27.0 - 33.0 pg Final  . MCHC 07/30/2016 33.0  32.0 - 36.0 g/dL Final  . RDW 07/30/2016 13.9  11.0 - 15.0 % Final  . Platelets 07/30/2016 272  140 - 400 K/uL Final  . MPV 07/30/2016 10.5  7.5 - 12.5 fL Final  . Neutro Abs 07/30/2016 3135  1,500 - 7,800 cells/uL Final  . Lymphs Abs 07/30/2016 1815  850 - 3,900 cells/uL Final  . Monocytes Absolute 07/30/2016 220  200 - 950 cells/uL Final  . Eosinophils Absolute 07/30/2016 275  15 - 500 cells/uL Final  . Basophils Absolute 07/30/2016 55  0 - 200 cells/uL Final  . Neutrophils Relative % 07/30/2016 57  % Final  . Lymphocytes Relative 07/30/2016 33  % Final  . Monocytes Relative 07/30/2016 4  % Final  . Eosinophils Relative 07/30/2016 5  % Final  . Basophils Relative 07/30/2016 1  % Final  . Smear Review 07/30/2016 Criteria for review not met   Final    No results found.   Assessment/Plan   ICD-10-CM   1. Dizziness R42   2. Essential hypertension, benign I10 CBC with Differential/Platelets  3. Spinal stenosis of cervical region M48.02   4. Mixed hyperlipidemia E78.2 Lipid Panel  5. Pain, joint, multiple sites M25.50   6. Anemia, unspecified type D64.9 CBC with Differential/Platelets  7. High risk medication use Z79.899 CMP with eGFR   Continue current medications as ordered  Follow up with specialists as scheduled  Follow up in 3 mos for HTN, hyperlipidemia. Fasting labs prior to appt   [place] S. Perlie Gold  [facility] [address] [place], [place] [address] [phone] Cell (Monday-Friday 8 AM - 5 PM) [phone] After 5 PM and follow prompts

## 2016-10-21 NOTE — Patient Instructions (Signed)
Continue current medications as ordered  Follow up with specialists as scheduled  Follow up in 3 mos for HTN, hyperlipidemia. Fasting labs prior to appt

## 2016-12-12 ENCOUNTER — Other Ambulatory Visit: Payer: Self-pay | Admitting: Internal Medicine

## 2016-12-12 DIAGNOSIS — R42 Dizziness and giddiness: Secondary | ICD-10-CM

## 2016-12-14 ENCOUNTER — Other Ambulatory Visit: Payer: Self-pay | Admitting: Internal Medicine

## 2016-12-14 DIAGNOSIS — J301 Allergic rhinitis due to pollen: Secondary | ICD-10-CM

## 2017-01-16 ENCOUNTER — Other Ambulatory Visit: Payer: Medicare PPO

## 2017-01-20 ENCOUNTER — Ambulatory Visit (INDEPENDENT_AMBULATORY_CARE_PROVIDER_SITE_OTHER): Payer: Medicare PPO | Admitting: Internal Medicine

## 2017-01-20 ENCOUNTER — Encounter: Payer: Self-pay | Admitting: Internal Medicine

## 2017-01-20 VITALS — BP 134/78 | HR 60 | Temp 97.6°F | Ht 65.0 in | Wt 146.0 lb

## 2017-01-20 DIAGNOSIS — I10 Essential (primary) hypertension: Secondary | ICD-10-CM | POA: Diagnosis not present

## 2017-01-20 DIAGNOSIS — N401 Enlarged prostate with lower urinary tract symptoms: Secondary | ICD-10-CM

## 2017-01-20 DIAGNOSIS — R972 Elevated prostate specific antigen [PSA]: Secondary | ICD-10-CM | POA: Diagnosis not present

## 2017-01-20 DIAGNOSIS — R42 Dizziness and giddiness: Secondary | ICD-10-CM | POA: Diagnosis not present

## 2017-01-20 DIAGNOSIS — E782 Mixed hyperlipidemia: Secondary | ICD-10-CM

## 2017-01-20 DIAGNOSIS — D649 Anemia, unspecified: Secondary | ICD-10-CM

## 2017-01-20 DIAGNOSIS — R3916 Straining to void: Secondary | ICD-10-CM

## 2017-01-20 DIAGNOSIS — Z79899 Other long term (current) drug therapy: Secondary | ICD-10-CM

## 2017-01-20 LAB — COMPLETE METABOLIC PANEL WITH GFR
AG Ratio: 1.5 (calc) (ref 1.0–2.5)
ALBUMIN MSPROF: 3.8 g/dL (ref 3.6–5.1)
ALKALINE PHOSPHATASE (APISO): 75 U/L (ref 40–115)
ALT: 7 U/L — ABNORMAL LOW (ref 9–46)
AST: 15 U/L (ref 10–35)
BILIRUBIN TOTAL: 0.3 mg/dL (ref 0.2–1.2)
BUN: 16 mg/dL (ref 7–25)
CHLORIDE: 106 mmol/L (ref 98–110)
CO2: 30 mmol/L (ref 20–32)
Calcium: 9.5 mg/dL (ref 8.6–10.3)
Creat: 0.73 mg/dL (ref 0.70–1.18)
GFR, Est African American: 102 mL/min/{1.73_m2} (ref >=60)
GFR, Est Non African American: 88 mL/min/{1.73_m2} (ref >=60)
GLOBULIN: 2.5 g/dL (ref 1.9–3.7)
GLUCOSE: 84 mg/dL (ref 65–139)
Potassium: 4.5 mmol/L (ref 3.5–5.3)
SODIUM: 141 mmol/L (ref 135–146)
Total Protein: 6.3 g/dL (ref 6.1–8.1)

## 2017-01-20 LAB — LIPID PANEL
CHOL/HDL RATIO: 3 (calc) (ref <5.0)
Cholesterol: 186 mg/dL (ref <200)
HDL: 61 mg/dL (ref >40)
LDL CHOLESTEROL (CALC): 105 mg/dL — AB
Non-HDL Cholesterol (Calc): 125 mg/dL (calc) (ref <130)
TRIGLYCERIDES: 107 mg/dL (ref <150)

## 2017-01-20 LAB — CBC WITH DIFFERENTIAL/PLATELET
BASOS ABS: 58 {cells}/uL (ref 0–200)
Basophils Relative: 1.1 %
EOS PCT: 8.3 %
Eosinophils Absolute: 440 cells/uL (ref 15–500)
HEMATOCRIT: 32.8 % — AB (ref 38.5–50.0)
Hemoglobin: 11.2 g/dL — ABNORMAL LOW (ref 13.2–17.1)
LYMPHS ABS: 1892 {cells}/uL (ref 850–3900)
MCH: 29.6 pg (ref 27.0–33.0)
MCHC: 34.1 g/dL (ref 32.0–36.0)
MCV: 86.5 fL (ref 80.0–100.0)
MPV: 11.2 fL (ref 7.5–12.5)
Monocytes Relative: 5.8 %
NEUTROS PCT: 49.1 %
Neutro Abs: 2602 cells/uL (ref 1500–7800)
PLATELETS: 254 10*3/uL (ref 140–400)
RBC: 3.79 10*6/uL — AB (ref 4.20–5.80)
RDW: 13 % (ref 11.0–15.0)
TOTAL LYMPHOCYTE: 35.7 %
WBC mixed population: 307 cells/uL (ref 200–950)
WBC: 5.3 10*3/uL (ref 3.8–10.8)

## 2017-01-20 NOTE — Patient Instructions (Addendum)
Will call with lab results  Continue current medications as ordered  [place] ON February 12, 2017 AT 1:00 PM for elevated PSA, enlarged prostate and difficulty urinating  Follow up in 3 mos for HTN, hx CVA, hyperlipidemia, joint pain.

## 2017-01-20 NOTE — Progress Notes (Signed)
Patient ID: Troy Ortiz, male   DOB: [birth year], 81 y.o.   MRN: [id number]   Location:  [facility] OFFICE  Provider: DR Arletha Grippe   Goals of Care:  Advanced Directives 10/21/2016  Does Patient Have a Medical Advance Directive? No  Type of Advance Directive -  Does patient want to make changes to medical advance directive? -  Copy of [place] in Chart? -  Would patient like information on creating a medical advance directive? Yes (ED - Information included in AVS)     Chief Complaint  Patient presents with  . Medical Management of Chronic Issues    3 Month- HTN, Hyperlipidemia    HPI: Patient is a 81 y.o. male seen today for medical management of chronic diseases.  He has occasional dizziness that improves with meclizine which he takes 2-3 times a day.  Bradycardia - stable. he saw cardio Dr Gwenlyn Found. No further w/u recommended. He denies syncope or presyncope.  Hx CVA - He has balance issues and intermittent dizziness x several yrs. 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 C spine MRI in 2010 showed multilevel DDD,  spinal stenosis severe at C4, C5 and C7. He is noncompliant with ASA over the last several months.  HTN - controlled on amlodipine and lisinopril. Noncompliant with ASA daily  Knee/neck/LBP - pain stable. C spine MRI in 2010 showed multilevel DDD,  spinal stenosis severe at C4, C5 and C7. He is out of tramadol and does not recall picking up Rx for diclofenac sent to pharmacy after last OV  Hyperlipidemia - LDL not at goal. He takes zocor. Admits to forgetting to take pill several times per week. Not taking ASA daily as directed. LDL 125  Allergic rhinitis - controlled. takes claritin prn. He has occasional nasal congestion  Constipation - stable overall. He has to use enema prn. He takes dulcolax prn. He takes a stool softener daily. Last colonoscopy in  2012 which revealed "2 subCM adenomas". He saw GI Dr Ardis Hughs in Dec 2017 and no further colonoscopies recommended due to age unless he starts having symptoms  Elevated PSA - followed by urology Dr Karsten Ro. Last seen in 2017. PSA 25.26. He has never rescheduled prostate bx due to no one available to care for his wife who has dementia. He has straining with urination and hesitant urine stream  Hx anemia - stable.  Hgb 11.1   Past Medical History:  Diagnosis Date  . Allergic rhinitis, cause unspecified   . Allergy   . Arthritis   . Asthma    AS A CHILD  . Cervicalgia   . Conjunctivitis unspecified   . Dizziness and giddiness   . Enthesopathy of unspecified site   . Generalized hyperhidrosis   . Lumbago   . Neoplasm of uncertain behavior of skin   . Other abnormal glucose   . Other and unspecified hyperlipidemia   . Rash and other nonspecific skin eruption   . Reflux esophagitis   . Stroke [facility])    MINI STROKES  . Unspecified constipation   . Unspecified essential hypertension   . Unspecified late effects of cerebrovascular disease     Past Surgical History:  Procedure Laterality Date  . COLONOSCOPY       reports that he has quit smoking. He quit after 30.00 years of use. he has never used smokeless tobacco. He reports that he does  not drink alcohol or use drugs. Social History   Socioeconomic History  . Marital status: Married    Spouse name: Not on file  . Number of children: 59  . Years of education: Not on file  . Highest education level: Not on file  Social Needs  . Financial resource strain: Not on file  . Food insecurity - worry: Not on file  . Food insecurity - inability: Not on file  . Transportation needs - medical: Not on file  . Transportation needs - non-medical: Not on file  Occupational History  . Occupation: Glass blower/designer    Comment: retired  . Occupation: Corporate treasurer    Comment: retired  Tobacco Use  . Smoking status: Former Smoker    Years: 30.00  .  Smokeless tobacco: Never Used  . Tobacco comment: Quit at age 31  Substance and Sexual Activity  . Alcohol use: No  . Drug use: No  . Sexual activity: Yes  Other Topics Concern  . Not on file  Social History Narrative  . Not on file    Family History  Problem Relation Age of Onset  . Heart disease Mother     No Known Allergies  Outpatient Encounter Medications as of 01/20/2017  Medication Sig  . amLODipine (NORVASC) 10 MG tablet Take 1 tablet by mouth once daily  . aspirin 81 MG EC tablet Take 1 tablet by mouth daily.  Marland Kitchen aspirin 81 MG tablet Take 1 tablet (81 mg total) by mouth daily.  . diclofenac (VOLTAREN) 75 MG EC tablet Take 1 tablet (75 mg total) by mouth 2 (two) times daily.  Marland Kitchen lisinopril (PRINIVIL,ZESTRIL) 5 MG tablet Take 1 tablet (5 mg total) by mouth daily. for high blood pressure  . loratadine (CLARITIN) 10 MG tablet Take 1 tablet by mouth daily.  Marland Kitchen omeprazole (PRILOSEC) 20 MG capsule Take 20 mg by mouth daily.  . simvastatin (ZOCOR) 40 MG tablet Take 1 tablet by mouth at bedtime.  . TRAVEL SICKNESS 25 MG CHEW Take 1 tablet by mouth twice daily.  . [DISCONTINUED] senna-docusate (SENOKOT-S) 8.6-50 MG per tablet Take 1 tablet by mouth daily. (Patient not taking: Reported on 01/20/2017)  . [DISCONTINUED] simvastatin (ZOCOR) 80 MG tablet Take one-half tablet by mouth at bedtime (Patient not taking: Reported on 01/20/2017)   No facility-administered encounter medications on file as of 01/20/2017.     Review of Systems:  Review of Systems  Musculoskeletal: Positive for arthralgias and gait problem.  Neurological: Positive for dizziness.  All other systems reviewed and are negative.   Health Maintenance  Topic Date Due  . TETANUS/TDAP  07/19/2020 (Originally 11/05/1954)  . INFLUENZA VACCINE  Completed  . PNA vac Low Risk Adult  Completed  . COLONOSCOPY  Discontinued    Physical Exam: Vitals:   01/20/17 1421  BP: 134/78  Pulse: 60  Temp: 97.6 F (36.4 C)    TempSrc: Oral  SpO2: 98%  Weight: 146 lb (66.2 kg)  Height: '5\' 5"'$  (1.651 m)   Body mass index is 24.3 kg/m. Physical Exam  Constitutional: He is oriented to person, place, and time. He appears well-developed and well-nourished.  HENT:  Mouth/Throat: Oropharynx is clear and moist.  MMM; no oral thrush  Eyes: Pupils are equal, round, and reactive to light. No scleral icterus.  Neck: Neck supple. Carotid bruit is not present. No thyromegaly present.  Cardiovascular: Normal rate, regular rhythm and intact distal pulses. Exam reveals no gallop and no friction rub.  Murmur (1/6 SEM)  heard. No distal LE edema. No calf TTP  Pulmonary/Chest: Effort normal and breath sounds normal. He has no wheezes. He has no rales. He exhibits no tenderness.  Abdominal: Soft. Normal appearance and bowel sounds are normal. He exhibits no distension, no abdominal bruit, no pulsatile midline mass and no mass. There is no hepatomegaly. There is no tenderness. There is no rigidity, no rebound and no guarding. No hernia.  Musculoskeletal: He exhibits edema. He exhibits no deformity.  Lymphadenopathy:    He has no cervical adenopathy.  Neurological: He is alert and oriented to person, place, and time. Gait abnormal.  Skin: Skin is warm and dry. No rash noted.  Psychiatric: He has a normal mood and affect. His behavior is normal. Judgment and thought content normal. His speech is slurred.    Labs reviewed: Basic Metabolic Panel: Recent Labs    02/22/16 1642 07/30/16 1418  NA 140 141  K 4.0 4.0  CL 103 106  CO2 30 27  GLUCOSE 89 93  BUN 18 16  CREATININE 0.76 0.84  CALCIUM 9.5 8.9  TSH  --  1.52   Liver Function Tests: Recent Labs    02/22/16 1642 07/30/16 1418  AST 17  --   ALT 7* 6*  ALKPHOS 48  --   BILITOT 0.3  --   PROT 6.9  --   ALBUMIN 4.0  --    No results for input(s): LIPASE, AMYLASE in the last 8760 hours. No results for input(s): AMMONIA in the last 8760 hours. CBC: Recent Labs     02/22/16 1642 07/30/16 1418  WBC 5.9 5.5  NEUTROABS 2,596 3,135  HGB 12.5* 11.1*  HCT 37.4* 33.6*  MCV 87.2 87.3  PLT 282 272   Lipid Panel: Recent Labs    02/22/16 1642 07/30/16 1418  CHOL 241* 202*  HDL 67 55  LDLCALC 158* 125*  TRIG 80 108  CHOLHDL 3.6 3.7   Lab Results  Component Value Date   HGBA1C (H) 08/08/2008    6.2 (NOTE) The ADA recommends the following therapeutic goal for glycemic control related to Hgb A1c measurement: Goal of therapy: <6.5 Hgb A1c  Reference: American Diabetes Association: Clinical Practice Recommendations 2010, Diabetes Care, 2010, 33: (Suppl  1).    Procedures since last visit: No results found.  Assessment/Plan   ICD-10-CM   1. Elevated PSA R97.20 PSA  2. Benign prostatic hyperplasia (BPH) with straining on urination N40.1    R39.16   3. Dizziness R42   4. Mixed hyperlipidemia E78.2 Lipid Panel  5. High risk medication use Z79.899 CMP with eGFR  6. Essential hypertension, benign I10 CBC with Differential/Platelets  7. Anemia, unspecified type D64.9 CBC with Differential/Platelets   Will call with lab results  Continue current medications as ordered  FOLLOW UP WITH DR Karsten Ro ON February 12, 2017 AT 1:00 PM for elevated PSA, enlarged prostate and difficulty urinating  Follow up in 3 mos for HTN, hx CVA, hyperlipidemia, joint pain.  Aubriauna Riner S. Perlie Gold  [facility] [address] [place], [place] [address] [phone] Cell (Monday-Friday 8 AM - 5 PM) [phone] After 5 PM and follow prompts

## 2017-01-21 LAB — PSA: PSA: 78.2 ng/mL — AB (ref <=4.0)

## 2017-02-02 ENCOUNTER — Other Ambulatory Visit: Payer: Self-pay | Admitting: Internal Medicine

## 2017-02-02 DIAGNOSIS — R42 Dizziness and giddiness: Secondary | ICD-10-CM

## 2017-02-12 DIAGNOSIS — R3912 Poor urinary stream: Secondary | ICD-10-CM | POA: Diagnosis not present

## 2017-02-12 DIAGNOSIS — N403 Nodular prostate with lower urinary tract symptoms: Secondary | ICD-10-CM | POA: Diagnosis not present

## 2017-02-12 DIAGNOSIS — R972 Elevated prostate specific antigen [PSA]: Secondary | ICD-10-CM | POA: Diagnosis not present

## 2017-02-16 DIAGNOSIS — K006 Disturbances in tooth eruption: Secondary | ICD-10-CM | POA: Diagnosis not present

## 2017-03-12 DIAGNOSIS — R972 Elevated prostate specific antigen [PSA]: Secondary | ICD-10-CM | POA: Diagnosis not present

## 2017-03-12 DIAGNOSIS — C61 Malignant neoplasm of prostate: Secondary | ICD-10-CM | POA: Diagnosis not present

## 2017-03-20 ENCOUNTER — Other Ambulatory Visit (HOSPITAL_COMMUNITY): Payer: Self-pay | Admitting: Urology

## 2017-03-20 DIAGNOSIS — C61 Malignant neoplasm of prostate: Secondary | ICD-10-CM | POA: Diagnosis not present

## 2017-03-20 DIAGNOSIS — R3912 Poor urinary stream: Secondary | ICD-10-CM | POA: Diagnosis not present

## 2017-03-31 ENCOUNTER — Encounter (HOSPITAL_COMMUNITY): Payer: Self-pay

## 2017-03-31 ENCOUNTER — Encounter (HOSPITAL_COMMUNITY)
Admission: RE | Admit: 2017-03-31 | Discharge: 2017-03-31 | Disposition: A | Discharge status: Home / Self Care | Payer: Medicare HMO | Source: Ambulatory Visit | Attending: Urology | Admitting: Urology

## 2017-03-31 DIAGNOSIS — C61 Malignant neoplasm of prostate: Secondary | ICD-10-CM | POA: Diagnosis not present

## 2017-03-31 DIAGNOSIS — Z8546 Personal history of malignant neoplasm of prostate: Secondary | ICD-10-CM | POA: Diagnosis not present

## 2017-03-31 MED ORDER — TECHNETIUM TC 99M MEDRONATE IV KIT: 25.0000 | PACK | Freq: Once | INTRAVENOUS | Status: DC | PRN

## 2017-04-07 DIAGNOSIS — C7951 Secondary malignant neoplasm of bone: Secondary | ICD-10-CM | POA: Diagnosis not present

## 2017-04-07 DIAGNOSIS — C61 Malignant neoplasm of prostate: Secondary | ICD-10-CM | POA: Diagnosis not present

## 2017-04-07 DIAGNOSIS — C7800 Secondary malignant neoplasm of unspecified lung: Secondary | ICD-10-CM | POA: Diagnosis not present

## 2017-04-21 ENCOUNTER — Ambulatory Visit: Payer: Medicare PPO | Admitting: Internal Medicine

## 2017-04-27 DIAGNOSIS — C61 Malignant neoplasm of prostate: Secondary | ICD-10-CM | POA: Diagnosis not present

## 2017-04-27 DIAGNOSIS — Z5111 Encounter for antineoplastic chemotherapy: Secondary | ICD-10-CM | POA: Diagnosis not present

## 2017-04-28 ENCOUNTER — Ambulatory Visit (INDEPENDENT_AMBULATORY_CARE_PROVIDER_SITE_OTHER): Payer: Medicare HMO | Admitting: Internal Medicine

## 2017-04-28 ENCOUNTER — Ambulatory Visit (INDEPENDENT_AMBULATORY_CARE_PROVIDER_SITE_OTHER): Payer: Medicare HMO

## 2017-04-28 ENCOUNTER — Encounter: Payer: Self-pay | Admitting: Internal Medicine

## 2017-04-28 VITALS — BP 138/60 | HR 90 | Temp 97.9°F | Ht 65.0 in | Wt 139.0 lb

## 2017-04-28 DIAGNOSIS — C61 Malignant neoplasm of prostate: Secondary | ICD-10-CM | POA: Diagnosis not present

## 2017-04-28 DIAGNOSIS — E782 Mixed hyperlipidemia: Secondary | ICD-10-CM | POA: Diagnosis not present

## 2017-04-28 DIAGNOSIS — C7951 Secondary malignant neoplasm of bone: Secondary | ICD-10-CM | POA: Diagnosis not present

## 2017-04-28 DIAGNOSIS — Z0000 Encounter for general adult medical examination without abnormal findings: Secondary | ICD-10-CM | POA: Diagnosis not present

## 2017-04-28 DIAGNOSIS — R42 Dizziness and giddiness: Secondary | ICD-10-CM | POA: Diagnosis not present

## 2017-04-28 DIAGNOSIS — Z79899 Other long term (current) drug therapy: Secondary | ICD-10-CM

## 2017-04-28 DIAGNOSIS — I10 Essential (primary) hypertension: Secondary | ICD-10-CM | POA: Diagnosis not present

## 2017-04-28 DIAGNOSIS — R918 Other nonspecific abnormal finding of lung field: Secondary | ICD-10-CM

## 2017-04-28 DIAGNOSIS — I679 Cerebrovascular disease, unspecified: Secondary | ICD-10-CM | POA: Diagnosis not present

## 2017-04-28 LAB — BASIC METABOLIC PANEL WITH GFR
BUN: 18 mg/dL (ref 7–25)
CALCIUM: 8.9 mg/dL (ref 8.6–10.3)
CHLORIDE: 104 mmol/L (ref 98–110)
CO2: 28 mmol/L (ref 20–32)
CREATININE: 0.85 mg/dL (ref 0.70–1.18)
GFR, Est African American: 96 mL/min/{1.73_m2} (ref >=60)
GFR, Est Non African American: 83 mL/min/{1.73_m2} (ref >=60)
GLUCOSE: 82 mg/dL (ref 65–99)
Potassium: 4.3 mmol/L (ref 3.5–5.3)
Sodium: 139 mmol/L (ref 135–146)

## 2017-04-28 LAB — LIPID PANEL
CHOLESTEROL: 183 mg/dL (ref <200)
HDL: 51 mg/dL (ref >40)
LDL CHOLESTEROL (CALC): 109 mg/dL — AB
Non-HDL Cholesterol (Calc): 132 mg/dL (calc) — ABNORMAL HIGH (ref <130)
Total CHOL/HDL Ratio: 3.6 (calc) (ref <5.0)
Triglycerides: 123 mg/dL (ref <150)

## 2017-04-28 LAB — ALT: ALT: 6 U/L — ABNORMAL LOW (ref 9–46)

## 2017-04-28 MED ORDER — TETANUS-DIPHTH-ACELL PERTUSSIS 5-2.5-18.5 LF-MCG/0.5 IM SUSP: 0.5000 mL | Freq: Once | INTRAMUSCULAR | 0 refills | Status: AC

## 2017-04-28 MED ORDER — ZOSTER VAC RECOMB ADJUVANTED 50 MCG/0.5ML IM SUSR: 0.5000 mL | Freq: Once | INTRAMUSCULAR | 1 refills | Status: AC

## 2017-04-28 NOTE — Patient Instructions (Addendum)
Will call with imaging appt  Will call with lab results  Continue current medications as ordered  Follow up with urology as scheduled for Lupron injections  Follow up in 3 mos for HTN, hx CVA, prostate CA, pulmonary nodules

## 2017-04-28 NOTE — Progress Notes (Signed)
Patient ID: Troy Ortiz, male   DOB: January 16, 1936, 82 y.o.   MRN: [id number]   Location:  [facility] OFFICE  Provider: DR Arletha Grippe  Code Status:  Goals of Care:  Advanced Directives 04/28/2017  Does Patient Have a Medical Advance Directive? No  Type of Advance Directive -  Does patient want to make changes to medical advance directive? -  Copy of [place] in Chart? -  Would patient like information on creating a medical advance directive? Yes (MAU/Ambulatory/Procedural Areas - Information given)     Chief Complaint  Patient presents with  . Medical Management of Chronic Issues    3 month follow-up HTN, history of CVA, Hyperlipidemia, and joint pain. AWV completed today 28/30, passed clock drawing  . Medication Refill    No refills needed     HPI: Patient is a 82 y.o. male seen today for medical management of chronic diseases.  He completed AWV today. No concerns. MMSE 28/30. He has been losing weight 2/2 unable to chew. He had no teeth but now has upper dentures which help. He has been drinking 3 ensure per day and can now chew better. Weight down 29 lbs since July 2018. PSA elevated to 78 in Dec 2018. He saw urology and was dx with prostate cancer. Bone scan revealed mets in multiple areas. CT abd/pelvis showed b/l pulmonary nodules up to 16 mm in LLL concerning for pulmonary mets. He is willing to w/u pulm nodules. He is a past heavy smoker. He still gets dizzy at times with change in head position.  Bradycardia - stable. he saw cardio Dr Gwenlyn Found. No further w/u recommended. He denies syncope or presyncope.  Hx CVA - He has balance issues and intermittent dizziness x several yrs. 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 C spine MRI in 2010 showed multilevel DDD,  spinal stenosis severe at C4, C5 and C7. He is noncompliant with ASA over the last several months.  HTN -  controlled on amlodipine and lisinopril. Noncompliant with ASA daily  Knee/neck/LBP - pain stable. C spine MRI in 2010 showed multilevel DDD,  spinal stenosis severe at C4, C5 and C7. He is out of tramadol and does not recall picking up Rx for diclofenac sent to pharmacy after last OV  Hyperlipidemia - LDL not at goal. He takes zocor. Admits to forgetting to take pill several times per week. Not taking ASA daily as directed. LDL 105  Allergic rhinitis - controlled. takes claritin prn. He has occasional nasal congestion  Constipation - stable overall. He has to use enema prn. He takes dulcolax prn. He takes a stool softener daily. Last colonoscopy in 2012 which revealed "2 subCM adenomas". He saw GI Dr Ardis Hughs in Dec 2017 and no further colonoscopies recommended due to age unless he starts having symptoms  Prostate CA with bone mets/BPH - followed by urology Dr Karsten Ro. Started Lupron injections yesterday. PSA 78.2. He has straining with urination and hesitant urine stream. CT abd/pelvis revealed b/l pulmonary nodules concerning for non GU pulmonary mets.  Hx anemia - stable.  Hgb 11.2. He started MVI daily    Past Medical History:  Diagnosis Date  . Allergic rhinitis, cause unspecified   . Allergy   . Arthritis   . Asthma    AS A CHILD  . Cervicalgia   . Conjunctivitis unspecified   . Dizziness and giddiness   .  Enthesopathy of unspecified site   . Generalized hyperhidrosis   . Lumbago   . Neoplasm of uncertain behavior of skin   . Other abnormal glucose   . Other and unspecified hyperlipidemia   . Rash and other nonspecific skin eruption   . Reflux esophagitis   . Stroke [facility])    MINI STROKES  . Unspecified constipation   . Unspecified essential hypertension   . Unspecified late effects of cerebrovascular disease     Past Surgical History:  Procedure Laterality Date  . COLONOSCOPY       reports that he has quit smoking. He quit after 30.00 years of use. He has never used  smokeless tobacco. He reports that he does not drink alcohol or use drugs. Social History   Socioeconomic History  . Marital status: Married    Spouse name: Not on file  . Number of children: 6  . Years of education: Not on file  . Highest education level: Not on file  Occupational History  . 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 Financial resource strain: Not hard at all  . Food insecurity:    Worry: Never true    Inability: Never true  . Transportation needs:    Medical: No    Non-medical: No  Tobacco Use  . Smoking status: Former Smoker    Years: 30.00  . Smokeless tobacco: Never Used  . Tobacco comment: Quit at age 75  Substance and Sexual Activity  . Alcohol use: No  . Drug use: No  . Sexual activity: Yes  Lifestyle  . Physical activity:    Days per week: 0 days    Minutes per session: 0 min  . Stress: Not at all  Relationships  . Social connections:    Talks on phone: More than three times a week    Gets together: More than three times a week    Attends religious service: More than 4 times per year    Active member of club or organization: No    Attends meetings of clubs or organizations: Never    Relationship status: Married  . Intimate partner violence:    Fear of current or ex partner: No    Emotionally abused: No    Physically abused: No    Forced sexual activity: No  Other Topics Concern  . Not on file  Social History Narrative  . Not on file    Family History  Problem Relation Age of Onset  . Heart disease Mother     No Known Allergies  Outpatient Encounter Medications as of 04/28/2017  Medication Sig  . amLODipine (NORVASC) 10 MG tablet Take 1 tablet by mouth once daily  . aspirin 81 MG tablet Take 1 tablet (81 mg total) by mouth daily.  Marland Kitchen lisinopril (PRINIVIL,ZESTRIL) 5 MG tablet Take 1 tablet (5 mg total) by mouth daily. for high blood pressure  . loratadine (CLARITIN)  10 MG tablet Take 1 tablet by mouth daily.  Marland Kitchen omeprazole (PRILOSEC) 20 MG capsule Take 20 mg by mouth daily.  . simvastatin (ZOCOR) 40 MG tablet Take 1 tablet by mouth at bedtime.  . TRAVEL SICKNESS 25 MG CHEW chew 1 tablet by mouth three times a day if needed for dizziness  . [DISCONTINUED] diclofenac (VOLTAREN) 75 MG EC tablet Take 1 tablet (75 mg total) by mouth 2 (two) times daily. (Patient not taking: Reported on 04/28/2017)  . [  DISCONTINUED] Tdap (BOOSTRIX) 5-2.5-18.5 LF-MCG/0.5 injection Inject 0.5 mLs into the muscle once.  . [DISCONTINUED] Zoster Vaccine Adjuvanted [facility]) injection Inject 0.5 mLs into the muscle once.   No facility-administered encounter medications on file as of 04/28/2017.     Review of Systems:  Review of Systems  Constitutional: Positive for appetite change and unexpected weight change.  Musculoskeletal: Negative for arthralgias and back pain.  All other systems reviewed and are negative.   Health Maintenance  Topic Date Due  . TETANUS/TDAP  07/19/2020 (Originally 11/05/1954)  . INFLUENZA VACCINE  Completed  . PNA vac Low Risk Adult  Completed  . COLONOSCOPY  Discontinued    Physical Exam: Vitals:   04/28/17 1359  BP: 138/60  Pulse: 90  Temp: 97.9 F (36.6 C)  TempSrc: Oral  SpO2: 95%  Weight: 139 lb (63 kg)  Height: '5\' 5"'$  (1.651 m)   Body mass index is 23.13 kg/m. Physical Exam  Constitutional: He is oriented to person, place, and time. He appears well-developed.  Frail appearing in NAD  HENT:  Mouth/Throat: Oropharynx is clear and moist.  Upper dentures; MMM; no oral thrush  Eyes: Pupils are equal, round, and reactive to light. No scleral icterus.  Neck: Neck supple. Carotid bruit is not present. No thyromegaly present.  Cardiovascular: Normal rate, regular rhythm, normal heart sounds and intact distal pulses. Exam reveals no gallop and no friction rub.  No murmur heard. No distal LE edema. No calf TTP  Pulmonary/Chest: Effort  normal. He has no wheezes. He has rales (L>R base). He exhibits no tenderness.  Abdominal: Soft. Normal appearance and bowel sounds are normal. He exhibits no distension, no abdominal bruit, no pulsatile midline mass and no mass. There is no hepatomegaly. There is no tenderness. There is no rigidity, no rebound and no guarding. No hernia.  Musculoskeletal: He exhibits edema and deformity (small joints).  Lymphadenopathy:    He has no cervical adenopathy.  Neurological: He is alert and oriented to person, place, and time. He has normal reflexes.  Skin: Skin is warm and dry. No rash noted.  Psychiatric: He has a normal mood and affect. His behavior is normal. Judgment and thought content normal.    Labs reviewed: Basic Metabolic Panel: Recent Labs    07/30/16 1418 01/20/17 1453  NA 141 141  K 4.0 4.5  CL 106 106  CO2 27 30  GLUCOSE 93 84  BUN 16 16  CREATININE 0.84 0.73  CALCIUM 8.9 9.5  TSH 1.52  --    Liver Function Tests: Recent Labs    07/30/16 1418 01/20/17 1453  AST  --  15  ALT 6* 7*  BILITOT  --  0.3  PROT  --  6.3   No results for input(s): LIPASE, AMYLASE in the last 8760 hours. No results for input(s): AMMONIA in the last 8760 hours. CBC: Recent Labs    07/30/16 1418 01/20/17 1453  WBC 5.5 5.3  NEUTROABS 3,135 2,602  HGB 11.1* 11.2*  HCT 33.6* 32.8*  MCV 87.3 86.5  PLT 272 254   Lipid Panel: Recent Labs    07/30/16 1418 01/20/17 1453  CHOL 202* 186  HDL 55 61  LDLCALC 125* 105*  TRIG 108 107  CHOLHDL 3.7 3.0   Lab Results  Component Value Date   HGBA1C (H) 08/08/2008    6.2 (NOTE) The ADA recommends the following therapeutic goal for glycemic control related to Hgb A1c measurement: Goal of therapy: <6.5 Hgb A1c  Reference: American Diabetes  Association: Clinical Practice Recommendations 2010, Diabetes Care, 2010, 33: (Suppl  1).    Procedures since last visit: Nm Bone Scan Whole Body  Result Date: 04/01/2017 CLINICAL DATA:  History of  prostate malignancy. No current skeletal symptoms EXAM: NUCLEAR MEDICINE WHOLE BODY BONE SCAN TECHNIQUE: Whole body anterior and posterior images were obtained approximately 3 hours after intravenous injection of radiopharmaceutical. RADIOPHARMACEUTICALS:  19.9 mCi Technetium-71mMDP IV COMPARISON:  None. FINDINGS: There is adequate uptake of the radiopharmaceutical by the skeleton. There is adequate soft tissue clearance and renal activity. There is intensely increased uptake in the upper thoracic spine from approximately T3 through T5. There is increased uptake in the lateral aspect of the right ribcage involving the fourth, fifth, 6, and eighth ribs. There are foci of increased uptake in the mid lumbar spine both anteriorly and posteriorly. There are multiple areas of increased uptake within the sacrum and bony pelvis. There is a focus of increased uptake in the right acetabulum. On the recent abdominal and pelvic CT scan subtle sclerotic lesions were seen in the posterior aspect of the right acetabulum and in the posterior aspect of the left ischium and in the mid to lower sacrum in the midline and to the right. An abnormal sclerotic focus was demonstrated in the body of L4. IMPRESSION: Abnormal skeletal uptake as described. Findings are most compatible with metastatic disease. Electronically Signed   By: David  JMartiniqueM.D.   On: 04/01/2017 08:32    Assessment/Plan     ICD-10-CM   1. Pulmonary nodules R91.8   2. Prostate cancer metastatic to bone ([place] C61    C79.51   3. Dizziness R42   4. High risk medication use Z79.899 BMP with eGFR([facility])    ALT  5. Cerebrovascular disease, unspecified I67.9   6. Mixed hyperlipidemia E78.2 Lipid Panel  7. Essential hypertension, benign I10   8. Solitary pulmonary nodule R91.1 CANCELED: CT CHEST LCS NODULE F/U W/O CONTRAST   Will call with imaging appt  Will call with lab results  Continue current medications as ordered  Follow up with urology as  scheduled for Lupron injections  Follow up in 3 mos for HTN, hx CVA, prostate CA, pulmonary nodules   Safaa Stingley S. CPerlie Gold PPort [facility] Adult [facility] [address] [phone] (Monday-Friday 8 AM - 5 PM) [phone] 5 PM and follow prompts

## 2017-04-28 NOTE — Patient Instructions (Addendum)
[name] , Thank you for taking time to come for your Medicare Wellness Visit. I appreciate your ongoing commitment to your health goals. Please review the following plan we discussed and let me know if I can assist you in the future.   Screening recommendations/referrals: Colonoscopy excluded, you are over age 80 Recommended yearly ophthalmology/optometry visit for glaucoma screening and checkup Recommended yearly dental visit for hygiene and checkup  Vaccinations: Influenza vaccine up to date, due 2019 fall season Pneumococcal vaccine up to date, completed Tdap vaccine due, prescription sent to pharmacy Shingles vaccine due, prescription sent to pharmacy    Advanced directives: Advance directive discussed with you today. I have provided a copy for you to complete at home and have notarized. Once this is complete please bring a copy in to our office so we can scan it into your chart.  Conditions/risks identified: none  Next appointment: Tyson Dense, RN 04/30/2018 2:30pm  Preventive Care 5 Years and Older, Male Preventive care refers to lifestyle choices and visits with your health care provider that can promote health and wellness. What does preventive care include?  A yearly physical exam. This is also called an annual well check.  Dental exams once or twice a year.  Routine eye exams. Ask your health care provider how often you should have your eyes checked.  Personal lifestyle choices, including:  Daily care of your teeth and gums.  Regular physical activity.  Eating a healthy diet.  Avoiding tobacco and drug use.  Limiting alcohol use.  Practicing safe sex.  Taking low doses of aspirin every day.  Taking vitamin and mineral supplements as recommended by your health care provider. What happens during an annual well check? The services and screenings done by your health care provider during your annual well check will depend on your age, overall health,  lifestyle risk factors, and family history of disease. Counseling  Your health care provider may ask you questions about your:  Alcohol use.  Tobacco use.  Drug use.  Emotional well-being.  Home and relationship well-being.  Sexual activity.  Eating habits.  History of falls.  Memory and ability to understand (cognition).  Work and work Statistician. Screening  You may have the following tests or measurements:  Height, weight, and BMI.  Blood pressure.  Lipid and cholesterol levels. These may be checked every 5 years, or more frequently if you are over 51 years old.  Skin check.  Lung cancer screening. You may have this screening every year starting at age 63 if you have a 30-pack-year history of smoking and currently smoke or have quit within the past 15 years.  Fecal occult blood test (FOBT) of the stool. You may have this test every year starting at age 55.  Flexible sigmoidoscopy or colonoscopy. You may have a sigmoidoscopy every 5 years or a colonoscopy every 10 years starting at age 55.  Prostate cancer screening. Recommendations will vary depending on your family history and other risks.  Hepatitis C blood test.  Hepatitis B blood test.  Sexually transmitted disease (STD) testing.  Diabetes screening. This is done by checking your blood sugar (glucose) after you have not eaten for a while (fasting). You may have this done every 1-3 years.  Abdominal aortic aneurysm (AAA) screening. You may need this if you are a current or former smoker.  Osteoporosis. You may be screened starting at age 77 if you are at high risk. Talk with your health care provider about your test results, treatment  options, and if necessary, the need for more tests. Vaccines  Your health care provider may recommend certain vaccines, such as:  Influenza vaccine. This is recommended every year.  Tetanus, diphtheria, and acellular pertussis (Tdap, Td) vaccine. You may need a Td booster  every 10 years.  Zoster vaccine. You may need this after age 90.  Pneumococcal 13-valent conjugate (PCV13) vaccine. One dose is recommended after age 12.  Pneumococcal polysaccharide (PPSV23) vaccine. One dose is recommended after age 50. Talk to your health care provider about which screenings and vaccines you need and how often you need them. This information is not intended to replace advice given to you by your health care provider. Make sure you discuss any questions you have with your health care provider. Document Released: 02/16/2015 Document Revised: 10/10/2015 Document Reviewed: 11/21/2014 Elsevier Interactive Patient Education  2017 [place] Prevention in the Home Falls can cause injuries. They can happen to people of all ages. There are many things you can do to make your home safe and to help prevent falls. What can I do on the outside of my home?  Regularly fix the edges of walkways and driveways and fix any cracks.  Remove anything that might make you trip as you walk through a door, such as a raised step or threshold.  Trim any bushes or trees on the path to your home.  Use bright outdoor lighting.  Clear any walking paths of anything that might make someone trip, such as rocks or tools.  Regularly check to see if handrails are loose or broken. Make sure that both sides of any steps have handrails.  Any raised decks and porches should have guardrails on the edges.  Have any leaves, snow, or ice cleared regularly.  Use sand or salt on walking paths during winter.  Clean up any spills in your garage right away. This includes oil or grease spills. What can I do in the bathroom?  Use night lights.  Install grab bars by the toilet and in the tub and shower. Do not use towel bars as grab bars.  Use non-skid mats or decals in the tub or shower.  If you need to sit down in the shower, use a plastic, non-slip stool.  Keep the floor dry. Clean up any  water that spills on the floor as soon as it happens.  Remove soap buildup in the tub or shower regularly.  Attach bath mats securely with double-sided non-slip rug tape.  Do not have throw rugs and other things on the floor that can make you trip. What can I do in the bedroom?  Use night lights.  Make sure that you have a light by your bed that is easy to reach.  Do not use any sheets or blankets that are too big for your bed. They should not hang down onto the floor.  Have a firm chair that has side arms. You can use this for support while you get dressed.  Do not have throw rugs and other things on the floor that can make you trip. What can I do in the kitchen?  Clean up any spills right away.  Avoid walking on wet floors.  Keep items that you use a lot in easy-to-reach places.  If you need to reach something above you, use a strong step stool that has a grab bar.  Keep electrical cords out of the way.  Do not use floor polish or wax that makes floors slippery.  If you must use wax, use non-skid floor wax.  Do not have throw rugs and other things on the floor that can make you trip. What can I do with my stairs?  Do not leave any items on the stairs.  Make sure that there are handrails on both sides of the stairs and use them. Fix handrails that are broken or loose. Make sure that handrails are as long as the stairways.  Check any carpeting to make sure that it is firmly attached to the stairs. Fix any carpet that is loose or worn.  Avoid having throw rugs at the top or bottom of the stairs. If you do have throw rugs, attach them to the floor with carpet tape.  Make sure that you have a light switch at the top of the stairs and the bottom of the stairs. If you do not have them, ask someone to add them for you. What else can I do to help prevent falls?  Wear shoes that:  Do not have high heels.  Have rubber bottoms.  Are comfortable and fit you well.  Are closed  at the toe. Do not wear sandals.  If you use a stepladder:  Make sure that it is fully opened. Do not climb a closed stepladder.  Make sure that both sides of the stepladder are locked into place.  Ask someone to hold it for you, if possible.  Clearly mark and make sure that you can see:  Any grab bars or handrails.  First and last steps.  Where the edge of each step is.  Use tools that help you move around (mobility aids) if they are needed. These include:  Canes.  Walkers.  Scooters.  Crutches.  Turn on the lights when you go into a dark area. Replace any light bulbs as soon as they burn out.  Set up your furniture so you have a clear path. Avoid moving your furniture around.  If any of your floors are uneven, fix them.  If there are any pets around you, be aware of where they are.  Review your medicines with your doctor. Some medicines can make you feel dizzy. This can increase your chance of falling. Ask your doctor what other things that you can do to help prevent falls. This information is not intended to replace advice given to you by your health care provider. Make sure you discuss any questions you have with your health care provider. Document Released: 11/16/2008 Document Revised: 06/28/2015 Document Reviewed: 02/24/2014 Elsevier Interactive Patient Education  2017 [employer].

## 2017-04-28 NOTE — Progress Notes (Signed)
Subjective:   Troy Ortiz is a 82 y.o. male who presents for Medicare Annual/Subsequent preventive examination.  Last AWV-04/03/2016       Objective:    Vitals: BP 138/60 (BP Location: Left Arm, Patient Position: Sitting)   Pulse 90   Temp 97.9 F (36.6 C) (Oral)   Ht 5\' 5"  (1.651 m)   Wt 139 lb (63 kg)   SpO2 95%   BMI 23.13 kg/m   Body mass index is 23.13 kg/m.  Advanced Directives 04/28/2017 10/21/2016 07/30/2016 04/03/2016 04/03/2016 02/22/2016 11/30/2015  Does Patient Have a Medical Advance Directive? No No No No No No Yes  Type of Advance Directive - - - - - - Press photographer  Does patient want to make changes to medical advance directive? - - - - - - No - Patient declined  Copy of [place] in Chart? - - - - - - No - copy requested  Would patient like information on creating a medical advance directive? Yes (MAU/Ambulatory/Procedural Areas - Information given) Yes (ED - Information included in AVS) No - Patient declined Yes (ED - Information included in AVS) Yes (MAU/Ambulatory/Procedural Areas - Information given) No - Patient declined -    Tobacco Social History   Tobacco Use  Smoking Status Former Smoker  . Years: 30.00  Smokeless Tobacco Never Used  Tobacco Comment   Quit at age 24     Counseling given: Not Answered Comment: Quit at age 77   Clinical Intake:  Pre-visit preparation completed: No  Pain : No/denies pain     Nutritional Risks: None Diabetes: No  How often do you need to have someone help you when you read instructions, pamphlets, or other written materials from your doctor or pharmacy?: 1 - Never What is the last grade level you completed in school?: HS  Interpreter Needed?: No  Information entered by :: Tyson Dense, RN  Past Medical History:  Diagnosis Date  . Allergic rhinitis, cause unspecified   . Allergy   . Arthritis   . Asthma    AS A CHILD  . Cervicalgia   . Conjunctivitis unspecified     . Dizziness and giddiness   . Enthesopathy of unspecified site   . Generalized hyperhidrosis   . Lumbago   . Neoplasm of uncertain behavior of skin   . Other abnormal glucose   . Other and unspecified hyperlipidemia   . Rash and other nonspecific skin eruption   . Reflux esophagitis   . Stroke [facility])    MINI STROKES  . Unspecified constipation   . Unspecified essential hypertension   . Unspecified late effects of cerebrovascular disease    Past Surgical History:  Procedure Laterality Date  . COLONOSCOPY     Family History  Problem Relation Age of Onset  . Heart disease Mother    Social History   Socioeconomic History  . Marital status: Married    Spouse name: Not on file  . Number of children: 40  . Years of education: Not on file  . Highest education level: Not on file  Occupational History  . 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 Financial resource strain: Not hard at all  . Food insecurity:    Worry: Never true    Inability: Never true  . Transportation needs:    Medical: No    Non-medical: No  Tobacco Use  .  Smoking status: Former Smoker    Years: 30.00  . Smokeless tobacco: Never Used  . Tobacco comment: Quit at age 7  Substance and Sexual Activity  . Alcohol use: No  . Drug use: No  . Sexual activity: Yes  Lifestyle  . Physical activity:    Days per week: 0 days    Minutes per session: 0 min  . Stress: Not at all  Relationships  . Social connections:    Talks on phone: More than three times a week    Gets together: More than three times a week    Attends religious service: More than 4 times per year    Active member of club or organization: No    Attends meetings of clubs or organizations: Never    Relationship status: Married  Other Topics Concern  . Not on file  Social History Narrative  . Not on file    Outpatient Encounter Medications as of 04/28/2017  Medication Sig   . amLODipine (NORVASC) 10 MG tablet Take 1 tablet by mouth once daily  . aspirin 81 MG tablet Take 1 tablet (81 mg total) by mouth daily.  Marland Kitchen lisinopril (PRINIVIL,ZESTRIL) 5 MG tablet Take 1 tablet (5 mg total) by mouth daily. for high blood pressure  . loratadine (CLARITIN) 10 MG tablet Take 1 tablet by mouth daily.  Marland Kitchen omeprazole (PRILOSEC) 20 MG capsule Take 20 mg by mouth daily.  . simvastatin (ZOCOR) 40 MG tablet Take 1 tablet by mouth at bedtime.  . Tdap (BOOSTRIX) 5-2.5-18.5 LF-MCG/0.5 injection Inject 0.5 mLs into the muscle once for 1 dose.  . TRAVEL SICKNESS 25 MG CHEW chew 1 tablet by mouth three times a day if needed for dizziness  . Zoster Vaccine Adjuvanted [facility]) injection Inject 0.5 mLs into the muscle once for 1 dose.  . [DISCONTINUED] Tdap (BOOSTRIX) 5-2.5-18.5 LF-MCG/0.5 injection Inject 0.5 mLs into the muscle once.  . [DISCONTINUED] Zoster Vaccine Adjuvanted [facility]) injection Inject 0.5 mLs into the muscle once.  . [DISCONTINUED] aspirin 81 MG EC tablet Take 1 tablet by mouth daily.  . [DISCONTINUED] diclofenac (VOLTAREN) 75 MG EC tablet Take 1 tablet (75 mg total) by mouth 2 (two) times daily. (Patient not taking: Reported on 04/28/2017)   No facility-administered encounter medications on file as of 04/28/2017.     Activities of Daily Living In your present state of health, do you have any difficulty performing the following activities: 04/28/2017  Hearing? Y  Vision? Y  Difficulty concentrating or making decisions? N  Walking or climbing stairs? N  Dressing or bathing? N  Doing errands, shopping? N  Preparing Food and eating ? N  Using the Toilet? N  In the past six months, have you accidently leaked urine? N  Do you have problems with loss of bowel control? N  Managing your Medications? N  Managing your Finances? N  Housekeeping or managing your Housekeeping? N  Some recent data might be hidden    Patient Care Team: Gildardo Cranker, DO as PCP - General  (Internal Medicine)   Assessment:   This is a routine wellness examination for [place].  Exercise Activities and Dietary recommendations Current Exercise Habits: The patient does not participate in regular exercise at present, Exercise limited by: None identified  Goals    . Exercise 3x per week (30 min per time)     Patient will go back to silver sneakers        Fall Risk Fall Risk  04/28/2017 01/20/2017 07/30/2016 04/03/2016  02/22/2016  Falls in the past year? No No No No No  Risk for fall due to : - - - Impaired vision -   Is the patient's home free of loose throw rugs in walkways, pet beds, electrical cords, etc?   yes      Grab bars in the bathroom? yes      Handrails on the stairs?   yes      Adequate lighting?   yes   Depression Screen PHQ 2/9 Scores 04/28/2017 01/20/2017 04/03/2016 08/29/2015  PHQ - 2 Score 0 0 0 0    Cognitive Function MMSE - Mini Mental State Exam 04/28/2017 04/03/2016 02/22/2016 08/29/2015 07/28/2014  Orientation to time 5 5 5 5 4   Orientation to Place 5 4 5 5 5   Registration 3 3 3 3 3   Attention/ Calculation 5 5 5 5 5   Recall 1 3 1  0 2  Language- name 2 objects 2 2 2 2 2   Language- repeat 1 1 1 1 1   Language- follow 3 step command 3 3 3 3 3   Language- read & follow direction 1 1 1 1 1   Write a sentence 1 1 1 1 1   Copy design 1 1 1 1 1   Total score 28 29 28 27 28         Immunization History  Administered Date(s) Administered  . Influenza Split 12/20/2009, 10/25/2011  . Influenza, High Dose Seasonal PF 09/30/2016  . Influenza,inj,Quad PF,6+ Mos 01/24/2013, 12/23/2013  . Influenza-Unspecified 12/20/2014, 11/28/2015  . Pneumococcal Conjugate-13 08/07/2014  . Pneumococcal Polysaccharide-23 11/28/2015    Qualifies for Shingles Vaccine? Yes, educated and prescription sent to pharmacy  Screening Tests Health Maintenance  Topic Date Due  . TETANUS/TDAP  07/19/2020 (Originally 11/05/1954)  . INFLUENZA VACCINE  Completed  . PNA vac Low Risk Adult   Completed  . COLONOSCOPY  Discontinued   Cancer Screenings: Lung: Low Dose CT Chest recommended if Age 2-80 years, 30 pack-year currently smoking OR have quit w/in 15years. Patient does not qualify. Colorectal: up to date  Additional Screenings:  Hepatitis C Screening: declined    Plan:  I have personally reviewed and addressed the Medicare Annual Wellness questionnaire and have noted the following in the patient's chart:  A. Medical and social history B. Use of alcohol, tobacco or illicit drugs  C. Current medications and supplements D. Functional ability and status E.  Nutritional status F.  Physical activity G. Advance directives H. List of other physicians I.  Hospitalizations, surgeries, and ER visits in previous 12 months J.  [place] to include hearing, vision, cognitive, depression L. Referrals and appointments - none  In addition, I have reviewed and discussed with patient certain preventive protocols, quality metrics, and best practice recommendations. A written personalized care plan for preventive services as well as general preventive health recommendations were provided to patient.  See attached scanned questionnaire for additional information.   Signed,   Tyson Dense, RN Nurse Health Advisor  Patient Concerns: None

## 2017-04-30 ENCOUNTER — Telehealth: Payer: Self-pay

## 2017-04-30 DIAGNOSIS — R918 Other nonspecific abnormal finding of lung field: Secondary | ICD-10-CM

## 2017-04-30 DIAGNOSIS — R911 Solitary pulmonary nodule: Secondary | ICD-10-CM

## 2017-04-30 DIAGNOSIS — C61 Malignant neoplasm of prostate: Secondary | ICD-10-CM

## 2017-04-30 DIAGNOSIS — C7951 Secondary malignant neoplasm of bone: Secondary | ICD-10-CM

## 2017-04-30 NOTE — Telephone Encounter (Signed)
Incorrect test has been cancelled.

## 2017-04-30 NOTE — Telephone Encounter (Signed)
[facility] called to say that patient does not qualify for the ordered CT chest LCS Nodule f/u w/o contrast due to his age. Age range for this CT is 91- 31.   A CT Chest w/o contrast is the only imaging the patient will qualify for.   Order has been pended- please complete.

## 2017-05-01 DIAGNOSIS — C61 Malignant neoplasm of prostate: Secondary | ICD-10-CM | POA: Insufficient documentation

## 2017-05-01 DIAGNOSIS — R918 Other nonspecific abnormal finding of lung field: Secondary | ICD-10-CM | POA: Insufficient documentation

## 2017-05-01 DIAGNOSIS — Z79899 Other long term (current) drug therapy: Secondary | ICD-10-CM | POA: Insufficient documentation

## 2017-05-01 DIAGNOSIS — I679 Cerebrovascular disease, unspecified: Secondary | ICD-10-CM | POA: Insufficient documentation

## 2017-05-09 ENCOUNTER — Ambulatory Visit
Admission: RE | Admit: 2017-05-09 | Discharge: 2017-05-09 | Disposition: A | Discharge status: Home / Self Care | Payer: Medicare HMO | Source: Ambulatory Visit | Attending: Internal Medicine | Admitting: Internal Medicine

## 2017-05-09 DIAGNOSIS — R918 Other nonspecific abnormal finding of lung field: Secondary | ICD-10-CM

## 2017-05-09 DIAGNOSIS — C61 Malignant neoplasm of prostate: Secondary | ICD-10-CM

## 2017-05-09 DIAGNOSIS — C7951 Secondary malignant neoplasm of bone: Secondary | ICD-10-CM

## 2017-05-09 DIAGNOSIS — R911 Solitary pulmonary nodule: Secondary | ICD-10-CM

## 2017-07-08 DIAGNOSIS — C61 Malignant neoplasm of prostate: Secondary | ICD-10-CM | POA: Diagnosis not present

## 2017-07-09 ENCOUNTER — Telehealth: Payer: Self-pay | Admitting: *Deleted

## 2017-07-09 NOTE — Telephone Encounter (Signed)
Lelon Frohlich, daughter called and wanted to know about patient's current health.  Informed her that she was not listed on his Current DPR to discuss information. I advised her to contact patient and come with him to his next appointment to update records and share her concerns with patient and provider. She agreed.

## 2017-07-14 ENCOUNTER — Ambulatory Visit (INDEPENDENT_AMBULATORY_CARE_PROVIDER_SITE_OTHER): Payer: Medicare HMO | Admitting: Internal Medicine

## 2017-07-14 ENCOUNTER — Encounter: Payer: Self-pay | Admitting: Internal Medicine

## 2017-07-14 VITALS — BP 140/76 | HR 59 | Temp 97.7°F | Ht 65.0 in | Wt 130.0 lb

## 2017-07-14 DIAGNOSIS — R634 Abnormal weight loss: Secondary | ICD-10-CM | POA: Diagnosis not present

## 2017-07-14 DIAGNOSIS — R630 Anorexia: Secondary | ICD-10-CM

## 2017-07-14 DIAGNOSIS — C61 Malignant neoplasm of prostate: Secondary | ICD-10-CM

## 2017-07-14 LAB — COMPLETE METABOLIC PANEL WITH GFR
AG Ratio: 1.5 (calc) (ref 1.0–2.5)
ALKALINE PHOSPHATASE (APISO): 1571 U/L — AB (ref 40–115)
ALT: 6 U/L — ABNORMAL LOW (ref 9–46)
AST: 23 U/L (ref 10–35)
Albumin: 3.6 g/dL (ref 3.6–5.1)
BUN/Creatinine Ratio: 34 (calc) — ABNORMAL HIGH (ref 6–22)
BUN: 23 mg/dL (ref 7–25)
CO2: 26 mmol/L (ref 20–32)
CREATININE: 0.68 mg/dL — AB (ref 0.70–1.18)
Calcium: 8.9 mg/dL (ref 8.6–10.3)
Chloride: 107 mmol/L (ref 98–110)
GFR, Est African American: 105 mL/min/{1.73_m2} (ref >=60)
GFR, Est Non African American: 91 mL/min/{1.73_m2} (ref >=60)
GLUCOSE: 129 mg/dL (ref 65–139)
Globulin: 2.4 g/dL (calc) (ref 1.9–3.7)
Potassium: 4.2 mmol/L (ref 3.5–5.3)
Sodium: 140 mmol/L (ref 135–146)
Total Bilirubin: 0.3 mg/dL (ref 0.2–1.2)
Total Protein: 6 g/dL — ABNORMAL LOW (ref 6.1–8.1)

## 2017-07-14 LAB — CBC WITH DIFFERENTIAL/PLATELET
BASOS PCT: 1.1 %
Basophils Absolute: 51 cells/uL (ref 0–200)
Eosinophils Absolute: 161 cells/uL (ref 15–500)
Eosinophils Relative: 3.5 %
HCT: 30.9 % — ABNORMAL LOW (ref 38.5–50.0)
Hemoglobin: 10.6 g/dL — ABNORMAL LOW (ref 13.2–17.1)
LYMPHS ABS: 1610 {cells}/uL (ref 850–3900)
MCH: 29.4 pg (ref 27.0–33.0)
MCHC: 34.3 g/dL (ref 32.0–36.0)
MCV: 85.6 fL (ref 80.0–100.0)
MPV: 10.4 fL (ref 7.5–12.5)
Monocytes Relative: 6.3 %
Neutro Abs: 2489 cells/uL (ref 1500–7800)
Neutrophils Relative %: 54.1 %
PLATELETS: 247 10*3/uL (ref 140–400)
RBC: 3.61 10*6/uL — AB (ref 4.20–5.80)
RDW: 13.5 % (ref 11.0–15.0)
TOTAL LYMPHOCYTE: 35 %
WBC: 4.6 10*3/uL (ref 3.8–10.8)
WBCMIX: 290 {cells}/uL (ref 200–950)

## 2017-07-14 MED ORDER — MIRTAZAPINE 15 MG PO TABS: 15.0000 mg | ORAL_TABLET | Freq: Every day | ORAL | 3 refills | Status: DC

## 2017-07-14 NOTE — Patient Instructions (Signed)
STOP SIMVASTATIN!  START MIRTAZAPINE 15MG  AT BEDTIME to help appetite  Continue ensure at least 3 daily when you do not eat a meal  STOP DRIVING!  EXERCISE AS TOLERATED - NO SILVER SNEAKERS!  Follow up with urology as scheduled  Follow up in the office as scheduled June 26th or sooner if need be.

## 2017-07-14 NOTE — Progress Notes (Signed)
Patient ID: Troy Ortiz, male   DOB: [birth year], 82 y.o.   MRN: [id number]   [facility] OFFICE  Provider: DR Arletha Grippe  Code Status: FULL CODE Goals of Care:  Advanced Directives 04/28/2017  Does Patient Have a Medical Advance Directive? No  Type of Advance Directive -  Does patient want to make changes to medical advance directive? -  Copy of [place] in Chart? -  Would patient like information on creating a medical advance directive? Yes (MAU/Ambulatory/Procedural Areas - Information given)     Chief Complaint  Patient presents with  . Acute Visit    Patient c/o no appetitie, SOB, decreased energy, weight loss, balance issues, trouble swallowing, and concerned about PSA. Discuss progression of cancer. Here with daughters   . Medication Refill    No refills needed   . Medication Management    Takes aspirin off/on "When I think about it"     HPI: Patient is a 82 y.o. male seen today for an acute visit for loss of appetite. He has SOB with min exertion and balance is off. No nausea. He has dysphagia but tolerates ensure. He had lab draw from urology last week and lupron injection stopped due to PSA jumped to 346 (from 70s); total testosterone low at 10.2 . He is awaiting insurance approval to start new chemotx med apalutamide. He is a poor historian due to memory loss. Hx obtained from daughters. He would like to drive and attend silver sneakers program.  Hx CVA - He has balance issues and intermittent dizziness x several yrs. 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 C spine MRI in 2010 showed multilevel DDD,  spinal stenosis severe at C4, C5 and C7. He is noncompliant with ASA over the last several months.  HTN - controlled on amlodipine and lisinopril. Noncompliant with ASA daily  Knee/neck/LBP - pain stable. C spine MRI in 2010 showed multilevel DDD,  spinal  stenosis severe at C4, C5 and C7. He is out of tramadol and does not recall picking up Rx for diclofenac sent to pharmacy after last OV  Hyperlipidemia - LDL not at goal. He takes zocor. Admits to forgetting to take pill several times per week. Not taking ASA daily as directed. LDL 105  Allergic rhinitis - controlled. takes claritin prn. He has occasional nasal congestion  Constipation - stable overall. He has to use enema prn. He takes dulcolax prn. He takes a stool softener daily. Last colonoscopy in 2012 which revealed "2 subCM adenomas". He saw GI Dr Ardis Hughs in Dec 2017 and no further colonoscopies recommended due to age unless he starts having symptoms  Prostate CA with bone mets/BPH - followed by urology Dr Karsten Ro. He rec'd Lupron injection in March but that was stopped due to increase in PSA to 346 (prev 78.2 in March 2019). Urology plans for him to begin a pill apalutamide once approved by insurance. He has straining with urination and hesitant urine stream. CT abd/pelvis revealed b/l pulmonary nodules concerning for non GU pulmonary mets. CT chest revealed b/l pulmonary nodules s/o pulmonary mets. He has DOE.  Hx anemia - stable.  Hgb 11.2. He started MVI daily  Past Medical History:  Diagnosis Date  . Allergic rhinitis, cause unspecified   . Allergy   . Arthritis   . Asthma    AS A CHILD  . Cervicalgia   . Conjunctivitis unspecified   .  Dizziness and giddiness   . Enthesopathy of unspecified site   . Generalized hyperhidrosis   . Lumbago   . Neoplasm of uncertain behavior of skin   . Other abnormal glucose   . Other and unspecified hyperlipidemia   . Rash and other nonspecific skin eruption   . Reflux esophagitis   . Stroke [facility])    MINI STROKES  . Unspecified constipation   . Unspecified essential hypertension   . Unspecified late effects of cerebrovascular disease     Past Surgical History:  Procedure Laterality Date  . COLONOSCOPY       reports that he has quit  smoking. He quit after 30.00 years of use. He has never used smokeless tobacco. He reports that he does not drink alcohol or use drugs. Social History   Socioeconomic History  . Marital status: Married    Spouse name: Not on file  . Number of children: 13  . Years of education: Not on file  . Highest education level: Not on file  Occupational History  . 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 Financial resource strain: Not hard at all  . Food insecurity:    Worry: Never true    Inability: Never true  . Transportation needs:    Medical: No    Non-medical: No  Tobacco Use  . Smoking status: Former Smoker    Years: 30.00  . Smokeless tobacco: Never Used  . Tobacco comment: Quit at age 61  Substance and Sexual Activity  . Alcohol use: No  . Drug use: No  . Sexual activity: Yes  Lifestyle  . Physical activity:    Days per week: 0 days    Minutes per session: 0 min  . Stress: Not at all  Relationships  . Social connections:    Talks on phone: More than three times a week    Gets together: More than three times a week    Attends religious service: More than 4 times per year    Active member of club or organization: No    Attends meetings of clubs or organizations: Never    Relationship status: Married  . Intimate partner violence:    Fear of current or ex partner: No    Emotionally abused: No    Physically abused: No    Forced sexual activity: No  Other Topics Concern  . Not on file  Social History Narrative  . Not on file    Family History  Problem Relation Age of Onset  . Heart disease Mother     No Known Allergies  Outpatient Encounter Medications as of 07/14/2017  Medication Sig  . amLODipine (NORVASC) 10 MG tablet Take 1 tablet by mouth once daily  . aspirin 81 MG tablet Take 1 tablet (81 mg total) by mouth daily.  Marland Kitchen ENSURE (ENSURE) Take 237 mLs by mouth 3 (three) times daily between meals.   Marland Kitchen lisinopril (PRINIVIL,ZESTRIL) 5 MG tablet Take 1 tablet (5 mg total) by mouth daily. for high blood pressure  . loratadine (CLARITIN) 10 MG tablet Take 10 mg by mouth as needed for allergies.  . simvastatin (ZOCOR) 40 MG tablet Take 1 tablet by mouth at bedtime.  . TRAVEL SICKNESS 25 MG CHEW chew 1 tablet by mouth three times a day if needed for dizziness  . omeprazole (PRILOSEC) 20 MG capsule Take 20 mg by mouth daily.  . [DISCONTINUED] loratadine (  CLARITIN) 10 MG tablet Take 1 tablet by mouth daily. (Patient not taking: Reported on 07/14/2017)   No facility-administered encounter medications on file as of 07/14/2017.     Review of Systems:  Review of Systems  Unable to perform ROS: Other (memory loss)    Health Maintenance  Topic Date Due  . TETANUS/TDAP  07/19/2020 (Originally 11/05/1954)  . INFLUENZA VACCINE  09/03/2017  . PNA vac Low Risk Adult  Completed  . COLONOSCOPY  Discontinued    Physical Exam: Vitals:   07/14/17 0924  BP: 140/76  Pulse: (!) 59  Temp: 97.7 F (36.5 C)  TempSrc: Oral  SpO2: 95%  Weight: 130 lb (59 kg)  Height: '5\' 5"'$  (1.651 m)   Body mass index is 21.63 kg/m. Physical Exam  Constitutional: He is oriented to person, place, and time. He appears well-developed and well-nourished. He has a sickly appearance.  Frail appearing in NAD  HENT:  Mouth/Throat: Oropharynx is clear and moist.  MMM; no oral thrush  Eyes: Pupils are equal, round, and reactive to light. No scleral icterus.  Neck: Neck supple. Carotid bruit is not present. No thyromegaly present.  Cardiovascular: Normal rate, regular rhythm and intact distal pulses. Exam reveals no gallop and no friction rub.  Murmur (1/6 SEM) heard. no distal LE swelling. No calf TTP  Pulmonary/Chest: Effort normal and breath sounds normal. He has no wheezes. He has no rales. He exhibits no tenderness.  Abdominal: Soft. Bowel sounds are normal. He exhibits no distension, no abdominal bruit, no pulsatile  midline mass and no mass. There is no hepatosplenomegaly. There is no tenderness. There is no rebound and no guarding.  Musculoskeletal: He exhibits edema.       Right hip: He exhibits tenderness.       Back:       Legs: Lymphadenopathy:    He has no cervical adenopathy.       Right: No inguinal and no supraclavicular adenopathy present.       Left: No inguinal and no supraclavicular adenopathy present.  Neurological: He is alert and oriented to person, place, and time. He has normal reflexes. Gait (unsteady) abnormal.  Skin: Skin is warm and dry. No rash noted.  Psychiatric: He has a normal mood and affect. His behavior is normal. Judgment and thought content normal.    Labs reviewed: Basic Metabolic Panel: Recent Labs    07/30/16 1418 01/20/17 1453 04/28/17 1509  NA 141 141 139  K 4.0 4.5 4.3  CL 106 106 104  CO2 '27 30 28  '$ GLUCOSE 93 84 82  BUN '16 16 18  '$ CREATININE 0.84 0.73 0.85  CALCIUM 8.9 9.5 8.9  TSH 1.52  --   --    Liver Function Tests: Recent Labs    07/30/16 1418 01/20/17 1453 04/28/17 1509  AST  --  15  --   ALT 6* 7* 6*  BILITOT  --  0.3  --   PROT  --  6.3  --    No results for input(s): LIPASE, AMYLASE in the last 8760 hours. No results for input(s): AMMONIA in the last 8760 hours. CBC: Recent Labs    07/30/16 1418 01/20/17 1453  WBC 5.5 5.3  NEUTROABS 3,135 2,602  HGB 11.1* 11.2*  HCT 33.6* 32.8*  MCV 87.3 86.5  PLT 272 254   Lipid Panel: Recent Labs    07/30/16 1418 01/20/17 1453 04/28/17 1509  CHOL 202* 186 183  HDL 55 61 51  LDLCALC 125* 105*  109*  TRIG 108 107 123  CHOLHDL 3.7 3.0 3.6   Lab Results  Component Value Date   HGBA1C (H) 08/08/2008    6.2 (NOTE) The ADA recommends the following therapeutic goal for glycemic control related to Hgb A1c measurement: Goal of therapy: <6.5 Hgb A1c  Reference: American Diabetes Association: Clinical Practice Recommendations 2010, Diabetes Care, 2010, 33: (Suppl  1).    Procedures  since last visit: No results found.  Assessment/Plan   ICD-10-CM   1. Loss of appetite R63.0 CMP with eGFR([facility])    CBC with Differential/Platelets    mirtazapine (REMERON) 15 MG tablet  2. Weight loss, unintentional R63.4 CMP with eGFR([facility])    CBC with Differential/Platelets  3. Prostate cancer metastatic to multiple sites [facility]) C61 CMP with eGFR([facility])    CBC with Differential/Platelets   bone and lungs    Prognosis is likely poor due to quick jump in PSA on lupron. Will further d/w urology. He may be hospice candidate. Re-eval at next ov  STOP SIMVASTATIN!  START MIRTAZAPINE '15MG'$  AT BEDTIME to help appetite  Continue ensure at least 3 daily when you do not eat a meal  STOP DRIVING!  EXERCISE AS TOLERATED - NO SILVER SNEAKERS!  Follow up with urology as scheduled - reviewed faxed lab results as above  Follow up in the office as scheduled June 26th or sooner if need be  [facility] S. Perlie Gold  [facility] and Adult Medicine [address] [place], [place] [address] [phone] Cell (Monday-Friday 8 AM - 5 PM) [phone] After 5 PM and follow prompts

## 2017-07-16 ENCOUNTER — Telehealth: Payer: Self-pay

## 2017-07-16 NOTE — Telephone Encounter (Signed)
Tried calling daughter back but cannot leave message due to voicemail not set up.

## 2017-07-16 NOTE — Telephone Encounter (Signed)
Pt is free to leave his home as long as he is NOT DRIVING

## 2017-07-16 NOTE — Telephone Encounter (Signed)
Patient's daughter called questioning if patient can attend a Father's Day event 5 min up the street. Troy Ortiz states she was told at last visit that he should not leave the house, even for church. Dr.Carter informed patient to let the church come to him. Patient will not be active at pending event if he is ale to attend.  Please advise

## 2017-07-17 NOTE — Telephone Encounter (Signed)
Discussed response with Demietrice, [place] verbalized understanding

## 2017-07-17 NOTE — Telephone Encounter (Signed)
I called Troy Ortiz, I was unable to leave a message, no voicemail set. I will try again later

## 2017-07-29 ENCOUNTER — Ambulatory Visit (INDEPENDENT_AMBULATORY_CARE_PROVIDER_SITE_OTHER): Payer: Medicare HMO | Admitting: Internal Medicine

## 2017-07-29 ENCOUNTER — Encounter: Payer: Self-pay | Admitting: Internal Medicine

## 2017-07-29 ENCOUNTER — Ambulatory Visit: Payer: Medicare HMO | Admitting: Internal Medicine

## 2017-07-29 VITALS — BP 116/70 | HR 67 | Temp 98.3°F | Ht 65.0 in | Wt 130.0 lb

## 2017-07-29 DIAGNOSIS — C7951 Secondary malignant neoplasm of bone: Secondary | ICD-10-CM | POA: Diagnosis not present

## 2017-07-29 DIAGNOSIS — R630 Anorexia: Secondary | ICD-10-CM | POA: Diagnosis not present

## 2017-07-29 DIAGNOSIS — C61 Malignant neoplasm of prostate: Secondary | ICD-10-CM | POA: Diagnosis not present

## 2017-07-29 DIAGNOSIS — R634 Abnormal weight loss: Secondary | ICD-10-CM

## 2017-07-29 MED ORDER — HYDROCODONE-ACETAMINOPHEN 5-325 MG PO TABS: 1.0000 | ORAL_TABLET | Freq: Four times a day (QID) | ORAL | 0 refills | Status: DC | PRN

## 2017-07-29 NOTE — Patient Instructions (Signed)
START NORCO 5/325 TAKE 1 TABLET EVERY 6 HRS AS NEEDED FOR BONE PAIN  Continue other medications as ordered  Follow up in 6 weeks for bone pain, prostate mets.

## 2017-07-29 NOTE — Progress Notes (Signed)
Patient ID: Troy Ortiz, male   DOB: 1935/10/03, 82 y.o.   MRN: [id number]   Location:  [facility] OFFICE  Provider: DR Arletha Grippe  Code Status:  Goals of Care:  Advanced Directives 04/28/2017  Does Patient Have a Medical Advance Directive? No  Type of Advance Directive -  Does patient want to make changes to medical advance directive? -  Copy of [place] in Chart? -  Would patient like information on creating a medical advance directive? Yes (MAU/Ambulatory/Procedural Areas - Information given)     Chief Complaint  Patient presents with  . Medical Management of Chronic Issues    3 month follow-up, patient c/o left shoulder and side pain x couple of weeks (no known injury). Here with daughter Demietrice  . Medication Management    Discuss medication Erleada 60 mg recommened by Urologist, patient is not currently taking     HPI: Patient is a 82 y.o. male seen today for medical management of chronic diseases.  He c/o pain in left shoulder that does not improve with anything. Pain worsens when he lies on right shoulder but improves when lie on left side. His insurance approved new chemotx med, ERLEADA (apalutamide). He has not started med yet. PSA 346; Total testosterone 10.2. Weight stable. Appetite improved on remeron. Alk phos 1571. He is a poor historian due to memory loss. Hx obtained from chart and daughter  Hx CVA - He has balance issues and intermittent dizziness x several yrs. 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 C spine MRI in 2010 showed multilevel DDD,  spinal stenosis severe at C4, C5 and C7. He is noncompliant with ASA over the last several months.  HTN - controlled on amlodipine and lisinopril. Noncompliant with ASA daily  Knee/neck/LBP - pain stable. C spine MRI in 2010 showed multilevel DDD,  spinal stenosis severe at C4, C5 and C7. He is out of  tramadol and does not recall picking up Rx for diclofenac sent to pharmacy after last OV  Hyperlipidemia - LDL not at goal. He takes zocor. Admits to forgetting to take pill several times per week. Not taking ASA daily as directed. LDL 105  Allergic rhinitis - controlled. takes claritin prn. He has occasional nasal congestion  Constipation - stable overall. He has to use enema prn. He takes dulcolax prn. He takes a stool softener daily. Last colonoscopy in 2012 which revealed "2 subCM adenomas". He saw GI Dr Ardis Hughs in Dec 2017 and no further colonoscopies recommended due to age unless he starts having symptoms  Prostate CA with bone mets/BPH - followed by urology Dr Karsten Ro. He rec'd Lupron injection in March but that was stopped due to increase in PSA to 346 (prev 78.2 in March 2019). Urology plans for him to begin a pill apalutamide (ERLEADA) once approved by insurance. He has straining with urination and hesitant urine stream. CT abd/pelvis revealed b/l pulmonary nodules concerning for non GU pulmonary mets. CT chest revealed b/l pulmonary nodules s/o pulmonary mets. He has DOE. Whole body bone scan in Feb 2019 revealed lesions in T3-T5, right ribs, mid L spine, sacrum, bony pelvis and right acetabulum. Alk phos 1571  Hx anemia - stable.  Hgb 10.6.      Past Medical History:  Diagnosis Date  . Allergic rhinitis, cause unspecified   . Allergy   . Arthritis   . Asthma  AS A CHILD  . Cervicalgia   . Conjunctivitis unspecified   . Dizziness and giddiness   . Enthesopathy of unspecified site   . Generalized hyperhidrosis   . Lumbago   . Neoplasm of uncertain behavior of skin   . Other abnormal glucose   . Other and unspecified hyperlipidemia   . Rash and other nonspecific skin eruption   . Reflux esophagitis   . Stroke [facility])    MINI STROKES  . Unspecified constipation   . Unspecified essential hypertension   . Unspecified late effects of cerebrovascular disease     Past  Surgical History:  Procedure Laterality Date  . COLONOSCOPY       reports that he has quit smoking. He quit after 30.00 years of use. He has never used smokeless tobacco. He reports that he does not drink alcohol or use drugs. Social History   Socioeconomic History  . Marital status: Married    Spouse name: Not on file  . Number of children: 64  . Years of education: Not on file  . Highest education level: Not on file  Occupational History  . 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 Financial resource strain: Not hard at all  . Food insecurity:    Worry: Never true    Inability: Never true  . Transportation needs:    Medical: No    Non-medical: No  Tobacco Use  . Smoking status: Former Smoker    Years: 30.00  . Smokeless tobacco: Never Used  . Tobacco comment: Quit at age 78  Substance and Sexual Activity  . Alcohol use: No  . Drug use: No  . Sexual activity: Yes  Lifestyle  . Physical activity:    Days per week: 0 days    Minutes per session: 0 min  . Stress: Not at all  Relationships  . Social connections:    Talks on phone: More than three times a week    Gets together: More than three times a week    Attends religious service: More than 4 times per year    Active member of club or organization: No    Attends meetings of clubs or organizations: Never    Relationship status: Married  . Intimate partner violence:    Fear of current or ex partner: No    Emotionally abused: No    Physically abused: No    Forced sexual activity: No  Other Topics Concern  . Not on file  Social History Narrative  . Not on file    Family History  Problem Relation Age of Onset  . Heart disease Mother     No Known Allergies  Outpatient Encounter Medications as of 07/29/2017  Medication Sig  . amLODipine (NORVASC) 10 MG tablet Take 1 tablet by mouth once daily  . aspirin 81 MG tablet Take 1 tablet (81 mg  total) by mouth daily.  Marland Kitchen ENSURE (ENSURE) Take 237 mLs by mouth 3 (three) times daily between meals.  Marland Kitchen lisinopril (PRINIVIL,ZESTRIL) 5 MG tablet Take 1 tablet (5 mg total) by mouth daily. for high blood pressure  . loratadine (CLARITIN) 10 MG tablet Take 10 mg by mouth as needed for allergies.  . mirtazapine (REMERON) 15 MG tablet Take 1 tablet (15 mg total) by mouth at bedtime. For appetite stimulation  . omeprazole (PRILOSEC) 20 MG capsule Take 20 mg by mouth as needed.   Marland Kitchen  TRAVEL SICKNESS 25 MG CHEW chew 1 tablet by mouth three times a day if needed for dizziness   No facility-administered encounter medications on file as of 07/29/2017.     Review of Systems:  Review of Systems  Unable to perform ROS: Other (memory loss)    Health Maintenance  Topic Date Due  . TETANUS/TDAP  07/19/2020 (Originally 11/05/1954)  . INFLUENZA VACCINE  09/03/2017  . PNA vac Low Risk Adult  Completed  . COLONOSCOPY  Discontinued    Physical Exam: Vitals:   07/29/17 1339  BP: 116/70  Pulse: 67  Temp: 98.3 F (36.8 C)  TempSrc: Oral  SpO2: 94%  Weight: 130 lb (59 kg)  Height: '5\' 5"'$  (1.651 m)   Body mass index is 21.63 kg/m. Physical Exam  Constitutional: He is oriented to person, place, and time. He appears well-developed and well-nourished.  Frail appearing in NAD  HENT:  Mouth/Throat: Oropharynx is clear and moist.  MMM; no oral thrush  Eyes: Pupils are equal, round, and reactive to light. No scleral icterus.  Neck: Neck supple. Carotid bruit is not present. No thyromegaly present.  Cardiovascular: Normal rate, regular rhythm and intact distal pulses. Exam reveals no gallop and no friction rub.  Murmur (1/6 SEM) heard. no distal LE swelling. No calf TTP  Pulmonary/Chest: Effort normal. He has no wheezes. He has rales. He exhibits no tenderness.  Abdominal: Soft. Bowel sounds are normal. He exhibits no distension, no abdominal bruit, no pulsatile midline mass and no mass. There is no  hepatomegaly. There is no tenderness. There is no rebound and no guarding.  Musculoskeletal: He exhibits edema and tenderness.       Thoracic back: He exhibits decreased range of motion (b/l shoulder joints), bony tenderness and spasm.       Back:  Lymphadenopathy:    He has no cervical adenopathy.  Neurological: He is alert and oriented to person, place, and time. He has normal reflexes.  Skin: Skin is warm and dry. No rash noted.  Psychiatric: He has a normal mood and affect. His behavior is normal. Judgment and thought content normal.    Labs reviewed: Basic Metabolic Panel: Recent Labs    07/30/16 1418 01/20/17 1453 04/28/17 1509 07/14/17 1023  NA 141 141 139 140  K 4.0 4.5 4.3 4.2  CL 106 106 104 107  CO2 '27 30 28 26  '$ GLUCOSE 93 84 82 129  BUN '16 16 18 23  '$ CREATININE 0.84 0.73 0.85 0.68*  CALCIUM 8.9 9.5 8.9 8.9  TSH 1.52  --   --   --    Liver Function Tests: Recent Labs    01/20/17 1453 04/28/17 1509 07/14/17 1023  AST 15  --  23  ALT 7* 6* 6*  BILITOT 0.3  --  0.3  PROT 6.3  --  6.0*   No results for input(s): LIPASE, AMYLASE in the last 8760 hours. No results for input(s): AMMONIA in the last 8760 hours. CBC: Recent Labs    07/30/16 1418 01/20/17 1453 07/14/17 1023  WBC 5.5 5.3 4.6  NEUTROABS 3,135 2,602 2,489  HGB 11.1* 11.2* 10.6*  HCT 33.6* 32.8* 30.9*  MCV 87.3 86.5 85.6  PLT 272 254 247   Lipid Panel: Recent Labs    07/30/16 1418 01/20/17 1453 04/28/17 1509  CHOL 202* 186 183  HDL 55 61 51  LDLCALC 125* 105* 109*  TRIG 108 107 123  CHOLHDL 3.7 3.0 3.6   Lab Results  Component Value Date  HGBA1C (H) 08/08/2008    6.2 (NOTE) The ADA recommends the following therapeutic goal for glycemic control related to Hgb A1c measurement: Goal of therapy: <6.5 Hgb A1c  Reference: American Diabetes Association: Clinical Practice Recommendations 2010, Diabetes Care, 2010, 33: (Suppl  1).    Procedures since last visit: No results  found.  Assessment/Plan   ICD-10-CM   1. Prostate cancer metastatic to bone ([place]) C61    C79.51   2. Prostate cancer metastatic to multiple sites ([place]) C61 HYDROcodone-acetaminophen (NORCO) 5-325 MG tablet  3. Loss of appetite R63.0   4. Weight loss, unintentional R63.4      START NORCO 5/325 TAKE 1 TABLET EVERY 6 HRS AS NEEDED FOR BONE PAIN - maintain bowel regimen with daily metamucil + stool softener +/- miralax vs laxative  Emphasized - NO DRIVING as he has snuck out and driven vehicle since last ov  Continue other medications as ordered  Follow up with urology as scheduled - overall prognosis is poor per urology (80-90% mortality at 5 yrs)  Will need to discuss code status/advanced directives at next ov  Follow up in 6 weeks for bone pain, prostate mets.  Briane Birden S. Perlie Gold  [facility] and Adult Medicine [address] [place], [place] [address] [phone] Cell (Monday-Friday 8 AM - 5 PM) [phone] After 5 PM and follow prompts

## 2017-07-31 ENCOUNTER — Telehealth: Payer: Self-pay | Admitting: *Deleted

## 2017-07-31 NOTE — Telephone Encounter (Signed)
Received fax from [employer] [phone] Fax: [phone] for [place] for Daughter, Troy Ortiz. Paperwork states Leave to begin 07/16/17 due to patient's serious health condition.  Leave #: [id number].  Placed in Dr. [place] folder to review, fill out and sign.

## 2017-07-31 NOTE — Telephone Encounter (Addendum)
FMLA forms have been completed by Dr. Eulas Post. I faxed them back to [facility] at [phone].   I attempted to call Troy Ortiz but there was no answer and her voicemail was not set up.   A copy of the paperwork has been placed in the front filing cabinet.

## 2017-09-14 ENCOUNTER — Other Ambulatory Visit: Payer: Self-pay | Admitting: *Deleted

## 2017-09-14 DIAGNOSIS — I10 Essential (primary) hypertension: Secondary | ICD-10-CM

## 2017-09-14 MED ORDER — AMLODIPINE BESYLATE 10 MG PO TABS: ORAL_TABLET | ORAL | 1 refills | Status: DC

## 2017-09-14 NOTE — Telephone Encounter (Signed)
Patient walked into office requesting refill to be sent to [facility] for his Amlodipine. Faxed to pharmacy.

## 2017-09-15 ENCOUNTER — Telehealth: Payer: Self-pay

## 2017-09-15 NOTE — Telephone Encounter (Addendum)
Patients daughter called indicating that she was just speaking to someone about scheduling her father an appointment for foot swelling.   Demietrice states she was transferred to clinical intake and was calling back to ask if Dr.Carter could give a recommendation since no available appointments today. Demietrice states the medication was prescribed by patient's cancer doctor. I reviewed chart and was unable to identify that patient was under the care of an oncologist, [place] overheard conversation and indicated Demietrice may be referring to urologist.  I gave Demietrice the number to Urologist to call and inquire about possible side effects to medication prescribed by Dr.Ottelin

## 2017-09-15 NOTE — Telephone Encounter (Signed)
I called Troy Ortiz to find out what took place when she called the urologist. I was unable to leave a message, no voicemail setup. I will try again later

## 2017-09-17 ENCOUNTER — Ambulatory Visit (INDEPENDENT_AMBULATORY_CARE_PROVIDER_SITE_OTHER): Payer: Medicare HMO | Admitting: Family

## 2017-09-17 ENCOUNTER — Encounter: Payer: Self-pay | Admitting: Family

## 2017-09-17 VITALS — BP 144/78 | HR 72 | Temp 98.3°F | Resp 14 | Ht 65.0 in | Wt 138.8 lb

## 2017-09-17 DIAGNOSIS — R609 Edema, unspecified: Secondary | ICD-10-CM

## 2017-09-17 NOTE — Progress Notes (Signed)
Provider: Shyloh Derosa FNP-C  Gildardo Cranker, DO  Patient Care Team: Gildardo Cranker, DO as PCP - General (Internal Medicine) Kathie Rhodes, MD as Consulting Physician (Urology)  Extended Emergency Contact Information Primary Emergency Contact: Evelina Dun Address: [address]          [place], [place] [address] Johnnette Litter of [place] Phone: [phone] Relation: Spouse Secondary Emergency Contact: king, demietrice Mobile Phone: [phone] Relation: Daughter   Goals of care: Advanced Directive information Advanced Directives 04/28/2017  Does Patient Have a Medical Advance Directive? No  Type of Advance Directive -  Does patient want to make changes to medical advance directive? -  Copy of [place] in Chart? -  Would patient like information on creating a medical advance directive? Yes (MAU/Ambulatory/Procedural Areas - Information given)     Chief Complaint  Patient presents with  . Foot Swelling    bilateral; been on new urology med x1 month-urologist wanted to be sure it wasn't coming from something else before uro changed med    HPI:  Pt is a 82 y.o. male seen today at [facility] office for an acute visit for evaluation of bilateral leg edema.He is seen today escorted by daughter.He states was started on a new medication by urologist about a month ago and has noticed swelling on his leg.Swelling was worst this past Sunday.He has notified urologist but was told to follow up with PCP to rule out other causes prior to changing medication. He denies any cough,shortness of breath or wheezing.no abrupt weight gain.     Past Medical History:  Diagnosis Date  . Allergic rhinitis, cause unspecified   . Allergy   . Arthritis   . Asthma    AS A CHILD  . Cervicalgia   . Conjunctivitis unspecified   . Dizziness and giddiness   . Enthesopathy of unspecified site   . Generalized hyperhidrosis   . Lumbago   . Neoplasm of uncertain behavior of skin    . Other abnormal glucose   . Other and unspecified hyperlipidemia   . Rash and other nonspecific skin eruption   . Reflux esophagitis   . Stroke [facility])    MINI STROKES  . Unspecified constipation   . Unspecified essential hypertension   . Unspecified late effects of cerebrovascular disease    Past Surgical History:  Procedure Laterality Date  . COLONOSCOPY      No Known Allergies  Outpatient Encounter Medications as of 09/17/2017  Medication Sig  . amLODipine (NORVASC) 10 MG tablet Take 1 tablet by mouth once daily  . apalutamide (ERLEADA) 60 MG tablet Take 240 mg by mouth daily. May be taken with or without food. Swallow tablets whole.  Marland Kitchen aspirin 81 MG tablet Take 1 tablet (81 mg total) by mouth daily.  Marland Kitchen ENSURE (ENSURE) Take 237 mLs by mouth 3 (three) times daily between meals.  Marland Kitchen HYDROcodone-acetaminophen (NORCO) 5-325 MG tablet Take 1 tablet by mouth every 6 (six) hours as needed for moderate pain. For bone pain  . lisinopril (PRINIVIL,ZESTRIL) 5 MG tablet Take 1 tablet (5 mg total) by mouth daily. for high blood pressure  . loratadine (CLARITIN) 10 MG tablet Take 10 mg by mouth as needed for allergies.  . mirtazapine (REMERON) 15 MG tablet Take 1 tablet (15 mg total) by mouth at bedtime. For appetite stimulation  . omeprazole (PRILOSEC) 20 MG capsule Take 20 mg by mouth as needed.   . TRAVEL SICKNESS 25 MG CHEW chew 1 tablet by mouth  three times a day if needed for dizziness   No facility-administered encounter medications on file as of 09/17/2017.     Review of Systems  Constitutional: Negative for chills, fatigue, fever and unexpected weight change.       Appetite has improved with Remeron   HENT: Negative for congestion, rhinorrhea, sinus pressure, sinus pain, sneezing and sore throat.   Respiratory: Negative for cough, chest tightness, shortness of breath and wheezing.   Cardiovascular: Positive for leg swelling. Negative for chest pain and palpitations.    Gastrointestinal: Negative for abdominal distention, abdominal pain, constipation, diarrhea, nausea and vomiting.  Endocrine: Negative for cold intolerance and heat intolerance.  Skin: Negative for color change, pallor and rash.  Neurological: Negative for numbness and headaches.       Chronic dizziness meclizine effective   Psychiatric/Behavioral: Negative for agitation, confusion and sleep disturbance.    Immunization History  Administered Date(s) Administered  . Influenza Split 12/20/2009, 10/25/2011  . Influenza, High Dose Seasonal PF 09/30/2016  . Influenza,inj,Quad PF,6+ Mos 01/24/2013, 12/23/2013  . Influenza-Unspecified 12/20/2014, 11/28/2015  . Pneumococcal Conjugate-13 08/07/2014  . Pneumococcal Polysaccharide-23 11/28/2015   Pertinent  Health Maintenance Due  Topic Date Due  . INFLUENZA VACCINE  09/03/2017  . PNA vac Low Risk Adult  Completed  . COLONOSCOPY  Discontinued   Fall Risk  07/29/2017 07/14/2017 04/28/2017 04/28/2017 01/20/2017  Falls in the past year? No No No No No  Risk for fall due to : - - - - -    Vitals:   09/17/17 1029  BP: (!) 144/78  Pulse: 72  Resp: 14  Temp: 98.3 F (36.8 C)  TempSrc: Oral  SpO2: 96%  Weight: 138 lb 12.8 oz (63 kg)  Height: 5\' 5"  (1.651 m)   Body mass index is 23.1 kg/m. Physical Exam  Constitutional: He is oriented to person, place, and time.  Thin built,elderly in no acute distress   HENT:  Head: Normocephalic.  Right Ear: External ear normal.  Left Ear: External ear normal.  Mouth/Throat: Oropharynx is clear and moist. No oropharyngeal exudate.  Eyes: Pupils are equal, round, and reactive to light. Conjunctivae and EOM are normal. Right eye exhibits no discharge. Left eye exhibits no discharge. No scleral icterus.  Cardiovascular: Intact distal pulses. Exam reveals no gallop and no friction rub.  Murmur heard. Pulmonary/Chest: Effort normal and breath sounds normal. No stridor. No respiratory distress. He has no  wheezes. He has no rales.  Abdominal: Soft. Bowel sounds are normal. He exhibits no distension and no mass. There is no tenderness. There is no rebound and no guarding.  Musculoskeletal: Normal range of motion. He exhibits no tenderness.  Bilateral lower extremities 1-2+ edema   Neurological: He is oriented to person, place, and time.  Skin: Skin is warm and dry. No rash noted. No erythema. No pallor.  Psychiatric: He has a normal mood and affect. His speech is normal and behavior is normal. Judgment and thought content normal.  Vitals reviewed.   Labs reviewed: Recent Labs    01/20/17 1453 04/28/17 1509 07/14/17 1023  NA 141 139 140  K 4.5 4.3 4.2  CL 106 104 107  CO2 30 28 26   GLUCOSE 84 82 129  BUN 16 18 23   CREATININE 0.73 0.85 0.68*  CALCIUM 9.5 8.9 8.9   Recent Labs    01/20/17 1453 04/28/17 1509 07/14/17 1023  AST 15  --  23  ALT 7* 6* 6*  BILITOT 0.3  --  0.3  PROT 6.3  --  6.0*   Recent Labs    01/20/17 1453 07/14/17 1023  WBC 5.3 4.6  NEUTROABS 2,602 2,489  HGB 11.2* 10.6*  HCT 32.8* 30.9*  MCV 86.5 85.6  PLT 254 247   Lab Results  Component Value Date   TSH 1.52 07/30/2016   Lab Results  Component Value Date   HGBA1C (H) 08/08/2008    6.2 (NOTE) The ADA recommends the following therapeutic goal for glycemic control related to Hgb A1c measurement: Goal of therapy: <6.5 Hgb A1c  Reference: American Diabetes Association: Clinical Practice Recommendations 2010, Diabetes Care, 2010, 33: (Suppl  1).   Lab Results  Component Value Date   CHOL 183 04/28/2017   HDL 51 04/28/2017   LDLCALC 109 (H) 04/28/2017   TRIG 123 04/28/2017   CHOLHDL 3.6 04/28/2017    Significant Diagnostic Results in last 30 days:  No results found.  Assessment/Plan  Edema, unspecified type Bilateral lower extremities 1-2 + edema worst four days ago.Exam findings negative for cough,shortness of breath,wheezing or rales.No abrupt weight changes.suspect possible side  effects of apalutamide initiated one month ago by urology.will rule out other etiologies.check CBC and CMP today. Encouraged to wear knee high ted hose on in the morning and off at bedtime. Notify provider's office for symptoms of fluid overload such as cough,shortness of breath or wheezing or any abrupt weight changes. Follow up with Urology as directed to evaluate apalutamide.   Family/ staff Communication: Reviewed plan of care with patient and daughter.  Labs/tests ordered: CBC,CMP    Zareen Jamison C Kaaren Nass, NP

## 2017-09-17 NOTE — Patient Instructions (Signed)
1. Wear compression stocking in the morning and take them off at bedtime. 2. Elevate legs whenever you are sitting 3.follow up with Urologist for evaluation of medication    Edema Edema is when you have too much fluid in your body or under your skin. Edema may make your legs, feet, and ankles swell up. Swelling is also common in looser tissues, like around your eyes. This is a common condition. It gets more common as you get older. There are many possible causes of edema. Eating too much salt (sodium) and being on your feet or sitting for a long time can cause edema in your legs, feet, and ankles. Hot weather may make edema worse. Edema is usually painless. Your skin may look swollen or shiny. Follow these instructions at home:  Keep the swollen body part raised (elevated) above the level of your heart when you are sitting or lying down.  Do not sit still or stand for a long time.  Do not wear tight clothes. Do not wear garters on your upper legs.  Exercise your legs. This can help the swelling go down.  Wear elastic bandages or support stockings as told by your doctor.  Eat a low-salt (low-sodium) diet to reduce fluid as told by your doctor.  Depending on the cause of your swelling, you may need to limit how much fluid you drink (fluid restriction).  Take over-the-counter and prescription medicines only as told by your doctor. Contact a doctor if:  Treatment is not working.  You have heart, liver, or kidney disease and have symptoms of edema.  You have sudden and unexplained weight gain. Get help right away if:  You have shortness of breath or chest pain.  You cannot breathe when you lie down.  You have pain, redness, or warmth in the swollen areas.  You have heart, liver, or kidney disease and get edema all of a sudden.  You have a fever and your symptoms get worse all of a sudden. Summary  Edema is when you have too much fluid in your body or under your skin.  Edema  may make your legs, feet, and ankles swell up. Swelling is also common in looser tissues, like around your eyes.  Raise (elevate) the swollen body part above the level of your heart when you are sitting or lying down.  Follow your doctor's instructions about diet and how much fluid you can drink (fluid restriction). This information is not intended to replace advice given to you by your health care provider. Make sure you discuss any questions you have with your health care provider. Document Released: 07/09/2007 Document Revised: 02/08/2016 Document Reviewed: 02/08/2016 Elsevier Interactive Patient Education  2017 [employer].

## 2017-09-18 LAB — CBC
HEMATOCRIT: 27.2 % — AB (ref 38.5–50.0)
Hemoglobin: 9 g/dL — ABNORMAL LOW (ref 13.2–17.1)
MCH: 29.3 pg (ref 27.0–33.0)
MCHC: 33.1 g/dL (ref 32.0–36.0)
MCV: 88.6 fL (ref 80.0–100.0)
MPV: 10.9 fL (ref 7.5–12.5)
PLATELETS: 247 10*3/uL (ref 140–400)
RBC: 3.07 10*6/uL — AB (ref 4.20–5.80)
RDW: 15.6 % — ABNORMAL HIGH (ref 11.0–15.0)
WBC: 4.4 10*3/uL (ref 3.8–10.8)

## 2017-09-18 LAB — COMPLETE METABOLIC PANEL WITH GFR
AG Ratio: 1.3 (calc) (ref 1.0–2.5)
ALT: 3 U/L — AB (ref 9–46)
AST: 13 U/L (ref 10–35)
Albumin: 3.1 g/dL — ABNORMAL LOW (ref 3.6–5.1)
Alkaline phosphatase (APISO): 1780 U/L — ABNORMAL HIGH (ref 40–115)
BUN/Creatinine Ratio: 20 (calc) (ref 6–22)
BUN: 13 mg/dL (ref 7–25)
CALCIUM: 8.2 mg/dL — AB (ref 8.6–10.3)
CO2: 27 mmol/L (ref 20–32)
Chloride: 110 mmol/L (ref 98–110)
Creat: 0.66 mg/dL — ABNORMAL LOW (ref 0.70–1.18)
GFR, EST AFRICAN AMERICAN: 107 mL/min/{1.73_m2} (ref >=60)
GFR, EST NON AFRICAN AMERICAN: 92 mL/min/{1.73_m2} (ref >=60)
Globulin: 2.3 g/dL (calc) (ref 1.9–3.7)
Glucose, Bld: 73 mg/dL (ref 65–139)
Potassium: 4.1 mmol/L (ref 3.5–5.3)
Sodium: 143 mmol/L (ref 135–146)
TOTAL PROTEIN: 5.4 g/dL — AB (ref 6.1–8.1)
Total Bilirubin: 0.2 mg/dL (ref 0.2–1.2)

## 2017-09-23 ENCOUNTER — Encounter: Payer: Self-pay | Admitting: Internal Medicine

## 2017-10-02 ENCOUNTER — Encounter: Payer: Self-pay | Admitting: Internal Medicine

## 2017-10-02 ENCOUNTER — Ambulatory Visit (INDEPENDENT_AMBULATORY_CARE_PROVIDER_SITE_OTHER): Payer: Medicare HMO | Admitting: Internal Medicine

## 2017-10-02 VITALS — BP 122/68 | HR 60 | Temp 98.4°F | Ht 65.0 in | Wt 140.0 lb

## 2017-10-02 DIAGNOSIS — I679 Cerebrovascular disease, unspecified: Secondary | ICD-10-CM | POA: Diagnosis not present

## 2017-10-02 DIAGNOSIS — R42 Dizziness and giddiness: Secondary | ICD-10-CM | POA: Diagnosis not present

## 2017-10-02 DIAGNOSIS — C7951 Secondary malignant neoplasm of bone: Secondary | ICD-10-CM

## 2017-10-02 DIAGNOSIS — M2550 Pain in unspecified joint: Secondary | ICD-10-CM | POA: Diagnosis not present

## 2017-10-02 DIAGNOSIS — E782 Mixed hyperlipidemia: Secondary | ICD-10-CM | POA: Diagnosis not present

## 2017-10-02 DIAGNOSIS — Z8673 Personal history of transient ischemic attack (TIA), and cerebral infarction without residual deficits: Secondary | ICD-10-CM | POA: Diagnosis not present

## 2017-10-02 DIAGNOSIS — R600 Localized edema: Secondary | ICD-10-CM

## 2017-10-02 DIAGNOSIS — D630 Anemia in neoplastic disease: Secondary | ICD-10-CM

## 2017-10-02 DIAGNOSIS — C61 Malignant neoplasm of prostate: Secondary | ICD-10-CM | POA: Diagnosis not present

## 2017-10-02 DIAGNOSIS — R634 Abnormal weight loss: Secondary | ICD-10-CM

## 2017-10-02 LAB — LIPID PANEL
CHOLESTEROL: 225 mg/dL — AB (ref <200)
HDL: 44 mg/dL (ref >40)
LDL Cholesterol (Calc): 152 mg/dL (calc) — ABNORMAL HIGH
Non-HDL Cholesterol (Calc): 181 mg/dL (calc) — ABNORMAL HIGH (ref <130)
TRIGLYCERIDES: 156 mg/dL — AB (ref <150)
Total CHOL/HDL Ratio: 5.1 (calc) — ABNORMAL HIGH (ref <5.0)

## 2017-10-02 LAB — TSH: TSH: 3.57 m[IU]/L (ref 0.40–4.50)

## 2017-10-02 NOTE — Progress Notes (Signed)
Patient ID: Troy Ortiz, male   DOB: [birth year], 82 y.o.   MRN: [id number]   Location:  [facility] OFFICE  Provider: DR Arletha Grippe  Code Status:  Goals of Care:  Advanced Directives 04/28/2017  Does Patient Have a Medical Advance Directive? No  Type of Advance Directive -  Does patient want to make changes to medical advance directive? -  Copy of [place] in Chart? -  Would patient like information on creating a medical advance directive? Yes (MAU/Ambulatory/Procedural Areas - Information given)     Chief Complaint  Patient presents with  . Medical Management of Chronic Issues    follow-up    HPI: Patient is a 82 y.o. male seen today for f/u bone pain and prostate CA with mets. Pain med (norco) helps with pain in bones and shoulder. He is taking it qHS. Constipation controlled with miralax. He is scheduled to see Urology Dr Karsten Ro on 9/30th. He is currently on apalutamide. He was seen by Dinah earlier this month for LE swelling. He was told to get compression stockings but he has not purchased then yet. He does not keep legs elevated at times. He has had trouble with his dentures and is followed by dentist. HE CONTINUES TO DRIVE DESPITE BEING TOLD NOT TO DRIVE. Appetite improving and he has gained 10 lbs in last 2 mos. Albumin 3.1. He drinks ensure TID. He is a poor historian due to memory koss. Hx obtained from chart.  Weight loss - improved. Weight up 10 lbs. Appetite improved on remeron. He drinks ensure TID. Albumin 3.1.   Hx CVA - He has balance issues and intermittent dizziness x several yrs. 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 C spine MRI in 2010 showed multilevel DDD, spinal stenosis severe at C4, C5 and C7. He is noncompliant with ASA over the last several months.  HTN - controlled on amlodipine and lisinopril. Noncompliant with ASA daily  Knee/neck/LBP -  pain stable. C spine MRI in 2010 showed multilevel DDD,  spinal stenosis severe at C4, C5 and C7. Pain improved on norco  Hyperlipidemia - LDL not at goal. He takes zocor. Admits to forgetting to take pill several times per week. Not taking ASA daily as directed. LDL 105  Allergic rhinitis - controlled. takes claritin prn. He has occasional nasal congestion  Constipation - stable overall. He has to use enema prn. He takes dulcolax prn. He takes a stool softener daily. Last colonoscopy in 2012 which revealed "2 subCM adenomas". He saw GI Dr Ardis Hughs in Dec 2017 and no further colonoscopies recommended due to age unless he starts having symptoms  Prostate CA with bone mets/BPH - followed by urology Dr Karsten Ro. He rec'd Lupron injection in March but that was stopped due to increase in PSA to 346 (prev 78.2 in March 2019). Urology started him on the pill apalutamide (ERLEADA). He has no issues with urination. Total testosterone level 10.2. CT abd/pelvis revealed b/l pulmonary nodules concerning for non GU pulmonary mets. CT chest revealed b/l pulmonary nodules s/o pulmonary mets. He reports no further DOE. Whole body bone scan in Feb 2019 revealed lesions in T3-T5, right ribs, mid L spine, sacrum, bony pelvis and right acetabulum. Alk phos 1780 (prev 1571)  Hx anemia - stable.  Hgb 9.0     Past Medical History:  Diagnosis Date  . Allergic rhinitis, cause unspecified   .  Allergy   . Arthritis   . Asthma    AS A CHILD  . Cervicalgia   . Conjunctivitis unspecified   . Dizziness and giddiness   . Enthesopathy of unspecified site   . Generalized hyperhidrosis   . Lumbago   . Neoplasm of uncertain behavior of skin   . Other abnormal glucose   . Other and unspecified hyperlipidemia   . Rash and other nonspecific skin eruption   . Reflux esophagitis   . Stroke [facility])    MINI STROKES  . Unspecified constipation   . Unspecified essential hypertension   . Unspecified late effects of  cerebrovascular disease     Past Surgical History:  Procedure Laterality Date  . COLONOSCOPY       reports that he has quit smoking. He quit after 30.00 years of use. He has never used smokeless tobacco. He reports that he does not drink alcohol or use drugs. Social History   Socioeconomic History  . Marital status: Married    Spouse name: Not on file  . Number of children: 53  . Years of education: Not on file  . Highest education level: Not on file  Occupational History  . 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 Financial resource strain: Not hard at all  . Food insecurity:    Worry: Never true    Inability: Never true  . Transportation needs:    Medical: No    Non-medical: No  Tobacco Use  . Smoking status: Former Smoker    Years: 30.00  . Smokeless tobacco: Never Used  . Tobacco comment: Quit at age 65  Substance and Sexual Activity  . Alcohol use: No  . Drug use: No  . Sexual activity: Yes  Lifestyle  . Physical activity:    Days per week: 0 days    Minutes per session: 0 min  . Stress: Not at all  Relationships  . Social connections:    Talks on phone: More than three times a week    Gets together: More than three times a week    Attends religious service: More than 4 times per year    Active member of club or organization: No    Attends meetings of clubs or organizations: Never    Relationship status: Married  . Intimate partner violence:    Fear of current or ex partner: No    Emotionally abused: No    Physically abused: No    Forced sexual activity: No  Other Topics Concern  . Not on file  Social History Narrative  . Not on file    Family History  Problem Relation Age of Onset  . Heart disease Mother     No Known Allergies  Outpatient Encounter Medications as of 10/02/2017  Medication Sig  . amLODipine (NORVASC) 10 MG tablet Take 1 tablet by mouth once daily  . apalutamide  (ERLEADA) 60 MG tablet Take 240 mg by mouth daily. May be taken with or without food. Swallow tablets whole.  Marland Kitchen aspirin 81 MG tablet Take 1 tablet (81 mg total) by mouth daily.  Marland Kitchen ENSURE (ENSURE) Take 237 mLs by mouth 3 (three) times daily between meals.  Marland Kitchen HYDROcodone-acetaminophen (NORCO) 5-325 MG tablet Take 1 tablet by mouth every 6 (six) hours as needed for moderate pain. For bone pain  . lisinopril (PRINIVIL,ZESTRIL) 5 MG tablet Take 1 tablet (5 mg total) by  mouth daily. for high blood pressure  . loratadine (CLARITIN) 10 MG tablet Take 10 mg by mouth as needed for allergies.  . mirtazapine (REMERON) 15 MG tablet Take 1 tablet (15 mg total) by mouth at bedtime. For appetite stimulation  . omeprazole (PRILOSEC) 20 MG capsule Take 20 mg by mouth as needed.   . TRAVEL SICKNESS 25 MG CHEW chew 1 tablet by mouth three times a day if needed for dizziness   No facility-administered encounter medications on file as of 10/02/2017.     Review of Systems:  Review of Systems  Unable to perform ROS: Other (memory loss)    Health Maintenance  Topic Date Due  . INFLUENZA VACCINE  09/03/2017  . TETANUS/TDAP  07/19/2020 (Originally 11/05/1954)  . PNA vac Low Risk Adult  Completed  . COLONOSCOPY  Discontinued    Physical Exam: Vitals:   10/02/17 1153  BP: 122/68  Pulse: 60  Temp: 98.4 F (36.9 C)  TempSrc: Oral  SpO2: 95%  Weight: 140 lb (63.5 kg)  Height: '5\' 5"'$  (1.651 m)   Body mass index is 23.3 kg/m. Physical Exam  Constitutional: He is oriented to person, place, and time. He appears well-developed.  Frail appearing in NAD  HENT:  Mouth/Throat: Oropharynx is clear and moist.  Upper dentures present; MMM; no oral thrush  Eyes: Pupils are equal, round, and reactive to light. No scleral icterus.  Neck: Neck supple. Carotid bruit is not present. No thyromegaly present.  Cardiovascular: Normal rate, regular rhythm and intact distal pulses. Exam reveals no gallop and no friction rub.    Murmur (1/6 SEM) heard. Trace L>RLE edema. No calf TTP  Pulmonary/Chest: Effort normal and breath sounds normal. He has no wheezes. He has no rales. He exhibits no tenderness.  Abdominal: Soft. Bowel sounds are normal. He exhibits no distension, no abdominal bruit, no pulsatile midline mass and no mass. There is no hepatosplenomegaly or hepatomegaly. There is no tenderness. There is no rebound and no guarding.  Musculoskeletal: He exhibits edema (small and large joint).  Lymphadenopathy:    He has no cervical adenopathy.  Neurological: He is alert and oriented to person, place, and time. He has normal reflexes. Gait (shuffles) abnormal.  Skin: Skin is warm and dry. No rash noted.  Psychiatric: He has a normal mood and affect. His behavior is normal. Judgment and thought content normal.    Labs reviewed: Basic Metabolic Panel: Recent Labs    04/28/17 1509 07/14/17 1023 09/17/17 1137  NA 139 140 143  K 4.3 4.2 4.1  CL 104 107 110  CO2 '28 26 27  '$ GLUCOSE 82 129 73  BUN '18 23 13  '$ CREATININE 0.85 0.68* 0.66*  CALCIUM 8.9 8.9 8.2*   Liver Function Tests: Recent Labs    01/20/17 1453 04/28/17 1509 07/14/17 1023 09/17/17 1137  AST 15  --  23 13  ALT 7* 6* 6* 3*  BILITOT 0.3  --  0.3 0.2  PROT 6.3  --  6.0* 5.4*   No results for input(s): LIPASE, AMYLASE in the last 8760 hours. No results for input(s): AMMONIA in the last 8760 hours. CBC: Recent Labs    01/20/17 1453 07/14/17 1023 09/17/17 1137  WBC 5.3 4.6 4.4  NEUTROABS 2,602 2,489  --   HGB 11.2* 10.6* 9.0*  HCT 32.8* 30.9* 27.2*  MCV 86.5 85.6 88.6  PLT 254 247 247   Lipid Panel: Recent Labs    01/20/17 1453 04/28/17 1509  CHOL 186 183  HDL 61 51  LDLCALC 105* 109*  TRIG 107 123  CHOLHDL 3.0 3.6   Lab Results  Component Value Date   HGBA1C (H) 08/08/2008    6.2 (NOTE) The ADA recommends the following therapeutic goal for glycemic control related to Hgb A1c measurement: Goal of therapy: <6.5 Hgb A1c   Reference: American Diabetes Association: Clinical Practice Recommendations 2010, Diabetes Care, 2010, 33: (Suppl  1).    Procedures since last visit: No results found.  Assessment/Plan   ICD-10-CM   1. Mixed hyperlipidemia E78.2 Lipid Panel    TSH  2. Weight loss, unintentional R63.4    improved  3. Prostate cancer metastatic to bone ([place]) C61    C79.51   4. Prostate cancer metastatic to multiple sites ([place]) C61   5. Dizziness R42   6. Cerebrovascular disease, unspecified I67.9   7. Pain, joint, multiple sites M25.50   8. Bilateral lower extremity edema R60.0   9. Anemia in neoplastic disease D63.0   10. History of CVA (cerebrovascular accident) Z86.73     He rec'd his flu shot at local pharmacy   Continue current medications as ordered  Follow up with specialists as scheduled  NO DRIVING  Keep legs elevated when seated  Follow up in 3 mos with Troy Ortiz for prostate CA with bone mets, edema, hx CVA, hyperlipidemia  Anabelle Bungert S. Perlie Gold  [facility] [address] [place], [place] [address] [phone] Cell (Monday-Friday 8 AM - 5 PM) [phone] After 5 PM and follow prompts

## 2017-10-02 NOTE — Patient Instructions (Addendum)
Continue current medications as ordered  Follow up with specialists as scheduled  NO DRIVING  Keep legs elevated when seated  Follow up in 3 mos with Troy Ortiz for prostate CA with bone mets, edema, hx CVA, hyperlipidemia

## 2017-11-02 DIAGNOSIS — Z192 Hormone resistant malignancy status: Secondary | ICD-10-CM | POA: Diagnosis not present

## 2017-11-02 DIAGNOSIS — C61 Malignant neoplasm of prostate: Secondary | ICD-10-CM | POA: Diagnosis not present

## 2017-11-02 DIAGNOSIS — C7951 Secondary malignant neoplasm of bone: Secondary | ICD-10-CM | POA: Diagnosis not present

## 2017-11-09 ENCOUNTER — Other Ambulatory Visit: Payer: Self-pay | Admitting: Internal Medicine

## 2017-11-09 DIAGNOSIS — R630 Anorexia: Secondary | ICD-10-CM

## 2017-11-11 ENCOUNTER — Telehealth: Payer: Self-pay | Admitting: *Deleted

## 2017-11-11 NOTE — Telephone Encounter (Signed)
Patient stopped by office requesting refill on his Simvastatin. Patient had a bottle. Wants it sent to [facility] [place]  After reviewing the chart, medication is not in his current medication list. On labs dated 10/02/17 Dr. Eulas Post stated she does not recommend restart statin at this time due to weight loss and decreased appetite.   Called and LM on patient's cell phone regarding Dr. Vale Haven recommendation from 10/02/17 labs of NOT taking the Statin at this time.

## 2017-11-25 ENCOUNTER — Ambulatory Visit (INDEPENDENT_AMBULATORY_CARE_PROVIDER_SITE_OTHER): Payer: Medicare HMO | Admitting: Nurse Practitioner

## 2017-11-25 ENCOUNTER — Encounter: Payer: Self-pay | Admitting: Nurse Practitioner

## 2017-11-25 VITALS — BP 184/78 | HR 63 | Temp 98.5°F | Ht 65.0 in | Wt 145.0 lb

## 2017-11-25 DIAGNOSIS — I10 Essential (primary) hypertension: Secondary | ICD-10-CM

## 2017-11-25 DIAGNOSIS — R634 Abnormal weight loss: Secondary | ICD-10-CM

## 2017-11-25 DIAGNOSIS — M542 Cervicalgia: Secondary | ICD-10-CM | POA: Diagnosis not present

## 2017-11-25 MED ORDER — LISINOPRIL 5 MG PO TABS: 5.0000 mg | ORAL_TABLET | Freq: Every day | ORAL | 3 refills | Status: DC

## 2017-11-25 MED ORDER — AMLODIPINE BESYLATE 10 MG PO TABS: ORAL_TABLET | ORAL | 1 refills | Status: DC

## 2017-11-25 NOTE — Patient Instructions (Addendum)
It sounds like pain is doing better BUT if needed can use heating pad to neck three times daily ~ 20 mins and muscle rub AFTER heat if needed for pain   Get refills on blood pressure medication- your blood pressure is HIGH Bring blood pressure cuff with you to your next visit

## 2017-11-25 NOTE — Progress Notes (Signed)
Careteam: Patient Care Team: Gildardo Cranker, DO as PCP - General (Internal Medicine) Kathie Rhodes, MD as Consulting Physician (Urology)  Advanced Directive information Does Patient Have a Medical Advance Directive?: No  No Known Allergies  Chief Complaint  Patient presents with  . Acute Visit    Pt has been having neck pain for several days. He stated that the pain has eased off today.      HPI: Patient is a 82 y.o. male seen in the office today due to neck pain, could not hardly move his hand for 2-3 days.  Reports it is doing much better today but it was too late for him cancel his appt. States he was using muscle rub which helps.   Has not been taking Remeron reports his appetite is fine since he has gotten his teeth. His weight is up from his last OV in August.  Has been off for around a week.   Did not take blood pressure medication. Unsure If he is out of it. Reports blood pressure at home is 124/79  Review of Systems:  Review of Systems  Constitutional: Negative for chills, fever and weight loss.  Cardiovascular: Negative for chest pain.  Musculoskeletal: Positive for neck pain (has resolved). Negative for back pain, falls, joint pain and myalgias.  Psychiatric/Behavioral: Positive for memory loss.    Past Medical History:  Diagnosis Date  . Allergic rhinitis, cause unspecified   . Allergy   . Arthritis   . Asthma    AS A CHILD  . Cervicalgia   . Conjunctivitis unspecified   . Dizziness and giddiness   . Enthesopathy of unspecified site   . Generalized hyperhidrosis   . Lumbago   . Neoplasm of uncertain behavior of skin   . Other abnormal glucose   . Other and unspecified hyperlipidemia   . Rash and other nonspecific skin eruption   . Reflux esophagitis   . Stroke [facility])    MINI STROKES  . Unspecified constipation   . Unspecified essential hypertension   . Unspecified late effects of cerebrovascular disease    Past Surgical History:  Procedure  Laterality Date  . COLONOSCOPY     Social History:   reports that he has quit smoking. He quit after 30.00 years of use. He has never used smokeless tobacco. He reports that he does not drink alcohol or use drugs.  Family History  Problem Relation Age of Onset  . Heart disease Mother     Medications: Patient's Medications  New Prescriptions   No medications on file  Previous Medications   AMLODIPINE (NORVASC) 10 MG TABLET    Take 1 tablet by mouth once daily   APALUTAMIDE (ERLEADA) 60 MG TABLET    Take 240 mg by mouth daily. May be taken with or without food. Swallow tablets whole.   ASPIRIN 81 MG TABLET    Take 1 tablet (81 mg total) by mouth daily.   ENSURE (ENSURE)    Take 237 mLs by mouth 3 (three) times daily between meals.   HYDROCODONE-ACETAMINOPHEN (NORCO) 5-325 MG TABLET    Take 1 tablet by mouth every 6 (six) hours as needed for moderate pain. For bone pain   LISINOPRIL (PRINIVIL,ZESTRIL) 5 MG TABLET    Take 1 tablet (5 mg total) by mouth daily. for high blood pressure   LORATADINE (CLARITIN) 10 MG TABLET    Take 10 mg by mouth as needed for allergies.   MIRTAZAPINE (REMERON) 15 MG TABLET  TAKE 1 TABLET BY MOUTH AT BEDTIME FOR APPETITE STIMULATION   OMEPRAZOLE (PRILOSEC) 20 MG CAPSULE    Take 20 mg by mouth as needed.    TRAVEL SICKNESS 25 MG CHEW    chew 1 tablet by mouth three times a day if needed for dizziness  Modified Medications   No medications on file  Discontinued Medications   No medications on file     Physical Exam:  Vitals:   11/25/17 1016  BP: (!) 184/78  Pulse: 63  Temp: 98.5 F (36.9 C)  TempSrc: Oral  SpO2: 98%  Weight: 145 lb (65.8 kg)  Height: 5\' 5"  (1.651 m)   Body mass index is 24.13 kg/m.  Physical Exam  Constitutional: He is oriented to person, place, and time. He appears well-developed.  Frail appearing in NAD  HENT:  Mouth/Throat: Oropharynx is clear and moist.  Upper dentures present; MMM; no oral thrush  Eyes: Pupils are  equal, round, and reactive to light. No scleral icterus.  Neck: Normal range of motion. Neck supple. Carotid bruit is not present. No thyromegaly present.  Cardiovascular: Normal rate, regular rhythm and intact distal pulses. Exam reveals no gallop and no friction rub.  Murmur (1/6 SEM) heard. Trace L>RLE edema. No calf TTP  Pulmonary/Chest: Effort normal and breath sounds normal.  Abdominal: He exhibits no abdominal bruit and no pulsatile midline mass. There is no hepatosplenomegaly or hepatomegaly.  Musculoskeletal: He exhibits no edema.  Lymphadenopathy:    He has no cervical adenopathy.  Neurological: He is alert and oriented to person, place, and time. He has normal reflexes. Gait (shuffles) abnormal.  Skin: Skin is warm and dry. No rash noted.    Labs reviewed: Basic Metabolic Panel: Recent Labs    04/28/17 1509 07/14/17 1023 09/17/17 1137 10/02/17 1229  NA 139 140 143  --   K 4.3 4.2 4.1  --   CL 104 107 110  --   CO2 28 26 27   --   GLUCOSE 82 129 73  --   BUN 18 23 13   --   CREATININE 0.85 0.68* 0.66*  --   CALCIUM 8.9 8.9 8.2*  --   TSH  --   --   --  3.57   Liver Function Tests: Recent Labs    01/20/17 1453 04/28/17 1509 07/14/17 1023 09/17/17 1137  AST 15  --  23 13  ALT 7* 6* 6* 3*  BILITOT 0.3  --  0.3 0.2  PROT 6.3  --  6.0* 5.4*   No results for input(s): LIPASE, AMYLASE in the last 8760 hours. No results for input(s): AMMONIA in the last 8760 hours. CBC: Recent Labs    01/20/17 1453 07/14/17 1023 09/17/17 1137  WBC 5.3 4.6 4.4  NEUTROABS 2,602 2,489  --   HGB 11.2* 10.6* 9.0*  HCT 32.8* 30.9* 27.2*  MCV 86.5 85.6 88.6  PLT 254 247 247   Lipid Panel: Recent Labs    01/20/17 1453 04/28/17 1509 10/02/17 1229  CHOL 186 183 225*  HDL 61 51 44  LDLCALC 105* 109* 152*  TRIG 107 123 156*  CHOLHDL 3.0 3.6 5.1*   TSH: Recent Labs    10/02/17 1229  TSH 3.57   A1C: Lab Results  Component Value Date   HGBA1C (H) 08/08/2008     6.2 (NOTE) The ADA recommends the following therapeutic goal for glycemic control related to Hgb A1c measurement: Goal of therapy: <6.5 Hgb A1c  Reference: American Diabetes Association: Clinical  Practice Recommendations 2010, Diabetes Care, 2010, 33: (Suppl  1).     Assessment/Plan 1. Essential hypertension, benign -elevated today, has not taken blood pressure medications. Educated that blood pressure was previously at goal because he was taking his medication. Plans to go home and take medications, refills provided if needed - amLODipine (NORVASC) 10 MG tablet; Take 1 tablet by mouth once daily  Dispense: 90 tablet; Refill: 1 - lisinopril (PRINIVIL,ZESTRIL) 5 MG tablet; Take 1 tablet (5 mg total) by mouth daily. for high blood pressure  Dispense: 90 tablet; Refill: 3  2. Weight loss, unintentional Remeron removed from medication list, pt states he is not taking and appetite has been good.   3. Cervicalgia Has improved points to right paraspinal cervical area that was tender but has now improved. With good ROM. Encouraged to use heat and muscle rub after if needed   Next appt: 01/04/2018, sooner if needed  Janett Billow K. [place], [facility] [phone]

## 2017-11-27 ENCOUNTER — Telehealth: Payer: Self-pay | Admitting: *Deleted

## 2017-11-27 NOTE — Telephone Encounter (Signed)
Patient daughter, Demietrice Edison Pace (listed DPR) called and stated that patient was in for neck pain 11/25/17 and she just wanted to see what Janett Billow Suggested for it. OV note reviewed and informed her that advise was Heat and Muscle rub. Told her that if it didn't get better that patient needed to follow up. She agreed.   She also wanted to know if the patient was prostate cancer free. I informed her that I could NOT answer that. Gave her the next appointment and told her that she might want to come to her dad's next visit to ask that question. She agreed.

## 2017-12-07 ENCOUNTER — Other Ambulatory Visit: Payer: Self-pay | Admitting: *Deleted

## 2017-12-07 DIAGNOSIS — R42 Dizziness and giddiness: Secondary | ICD-10-CM

## 2017-12-07 MED ORDER — MECLIZINE HCL 25 MG PO CHEW: CHEWABLE_TABLET | ORAL | 1 refills | Status: DC

## 2017-12-07 NOTE — Telephone Encounter (Signed)
Patient requested refill

## 2017-12-08 ENCOUNTER — Telehealth: Payer: Self-pay | Admitting: *Deleted

## 2017-12-08 DIAGNOSIS — C7951 Secondary malignant neoplasm of bone: Secondary | ICD-10-CM

## 2017-12-08 DIAGNOSIS — C61 Malignant neoplasm of prostate: Secondary | ICD-10-CM

## 2017-12-08 DIAGNOSIS — R634 Abnormal weight loss: Secondary | ICD-10-CM

## 2017-12-08 NOTE — Telephone Encounter (Signed)
Manus Gunning with Hospice called and stated that the patient's daughter, Troy Ortiz called requesting Hospice evaluation.  Manus Gunning stated that they need a referral placed if you think patient is appropriate for Hospice Services. Please Advise.

## 2017-12-08 NOTE — Telephone Encounter (Signed)
Okay to place hospice referral

## 2017-12-09 NOTE — Telephone Encounter (Signed)
Referral placed.

## 2018-01-04 ENCOUNTER — Ambulatory Visit: Payer: Medicare HMO | Admitting: Nurse Practitioner

## 2018-04-12 ENCOUNTER — Other Ambulatory Visit: Payer: Self-pay | Admitting: Nurse Practitioner

## 2018-04-12 DIAGNOSIS — R42 Dizziness and giddiness: Secondary | ICD-10-CM

## 2018-04-12 NOTE — Telephone Encounter (Signed)
Is it ok to continue to refill this? Last refill 12/07/2017 #90

## 2018-04-30 ENCOUNTER — Encounter: Payer: Medicare HMO | Admitting: Family

## 2018-04-30 ENCOUNTER — Ambulatory Visit: Payer: Medicare HMO

## 2018-05-26 DIAGNOSIS — Z5111 Encounter for antineoplastic chemotherapy: Secondary | ICD-10-CM | POA: Diagnosis not present

## 2018-06-09 IMAGING — CT CT CHEST W/O CM
1 of 2 series · 15 of 32 positions shown, 19 images · non-contrast
Comparison: CT abdomen and pelvis dated 03/31/2017.

CLINICAL DATA: Pulmonary nodule. History of prostate carcinoma with
metastatic disease.

EXAM:
CT CHEST WITHOUT CONTRAST
TECHNIQUE: Multidetector CT imaging of the chest was performed following the
standard protocol without IV contrast.

[Series 2: chest w/(date) · axial · 0.65mm/px · z∈[+919,+1209]mm · 15 of 169 slices shown, 19 images]
[im 12/169  mediastinal]
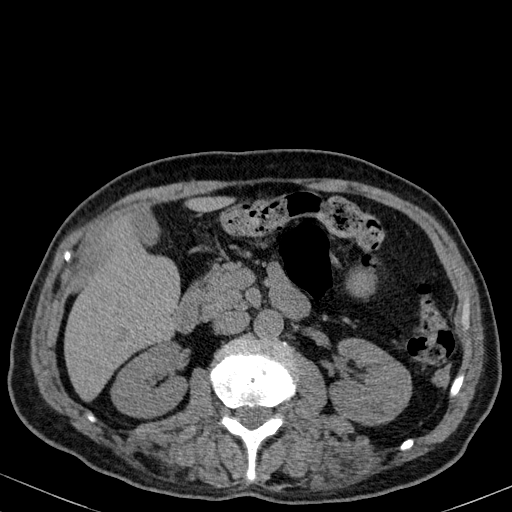
[im 12/169  lung]
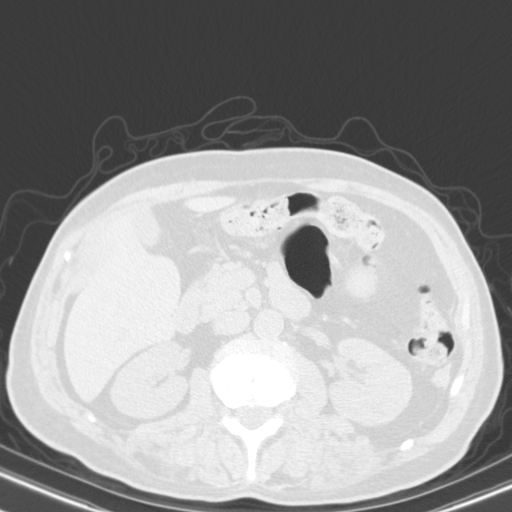
[im 23/169  lung]
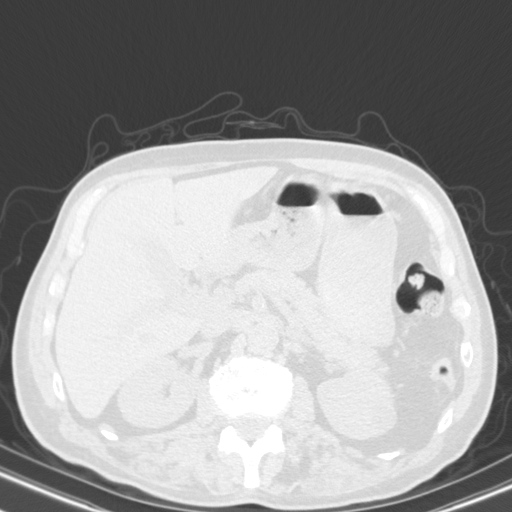
[im 34/169  lung]
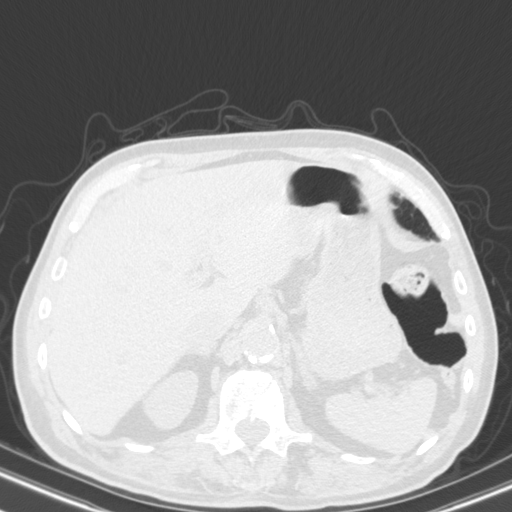
[im 45/169  lung]
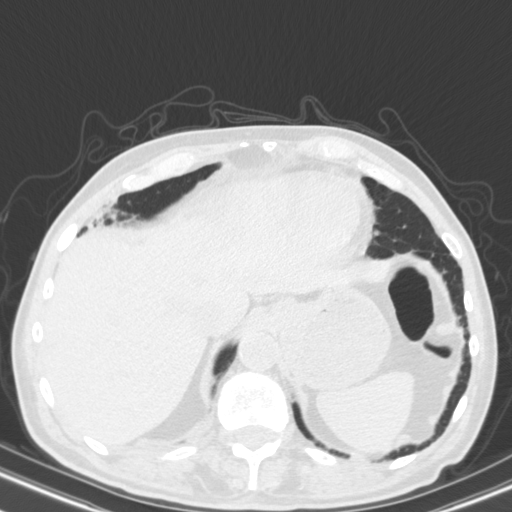
[im 57/169  mediastinal]
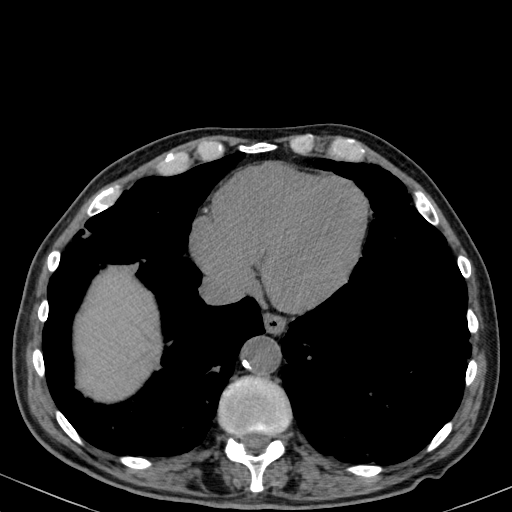
[im 57/169  lung]
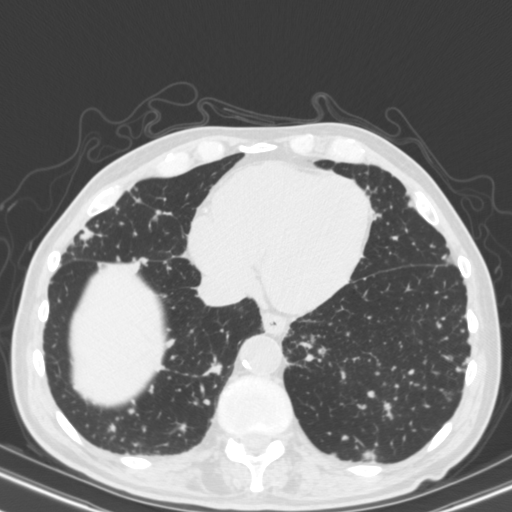
[im 68/169  lung]
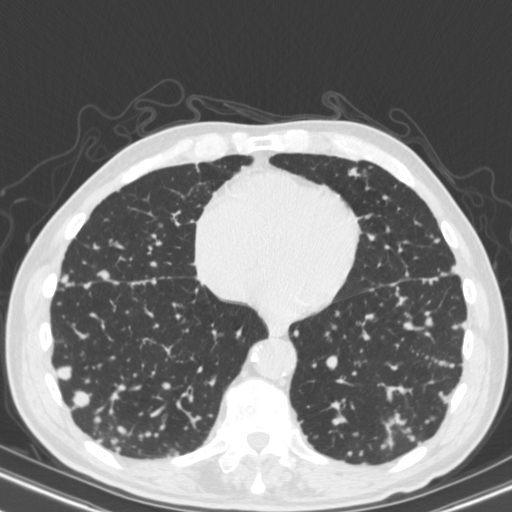
[im 79/169  lung]
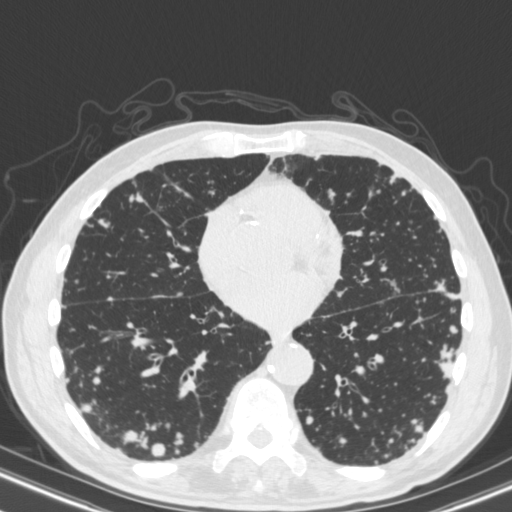
[im 85/169  lung]
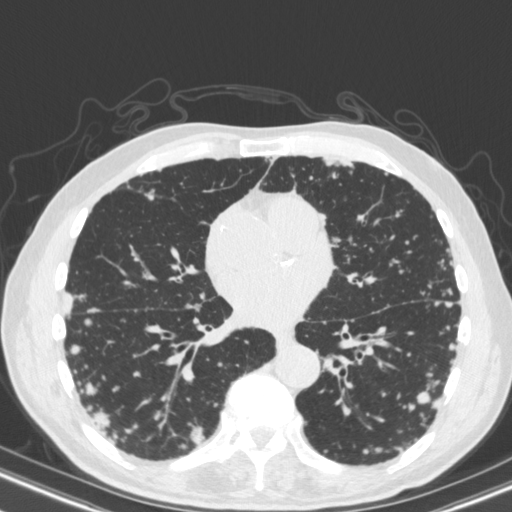
[im 90/169  mediastinal]
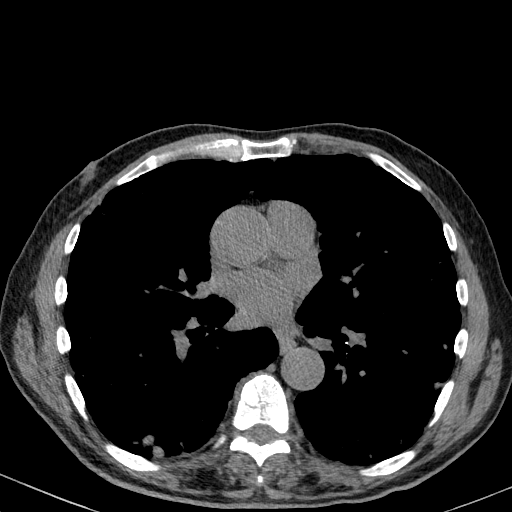
[im 90/169  lung]
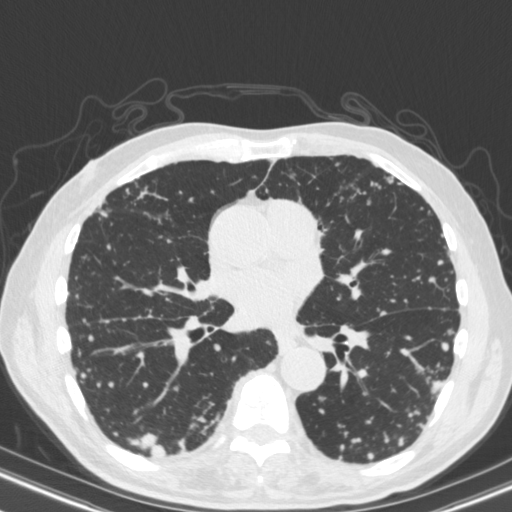
[im 101/169  lung]
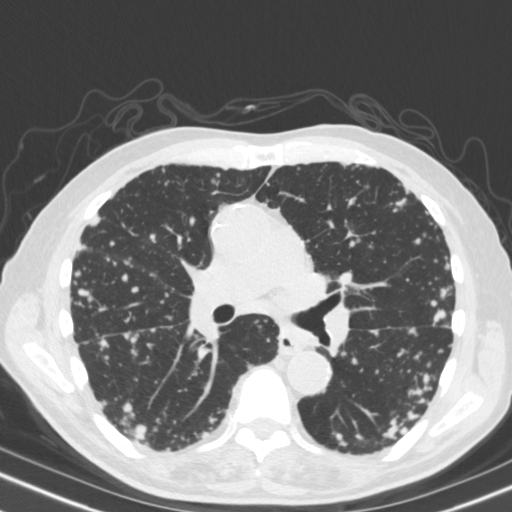
[im 113/169  lung]
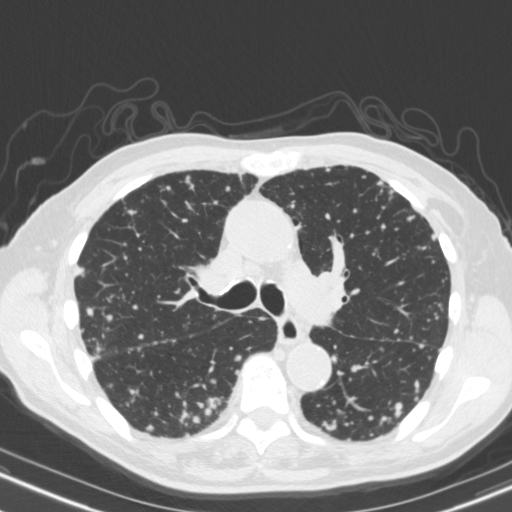
[im 124/169  lung]
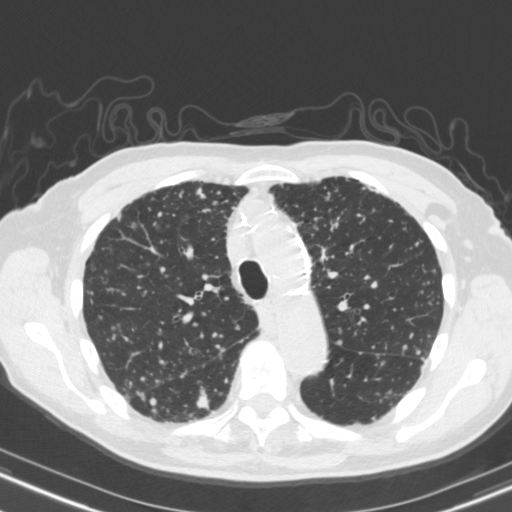
[im 135/169  mediastinal]
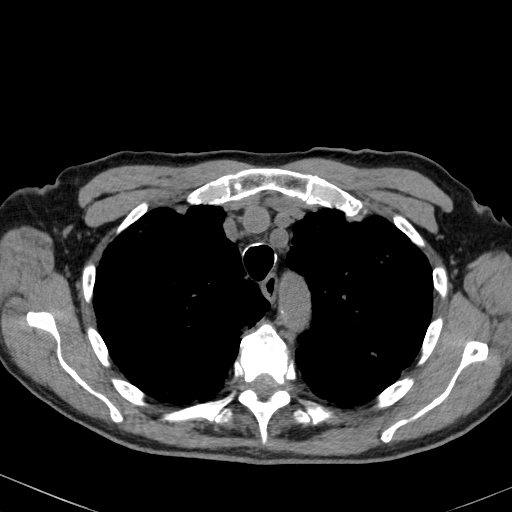
[im 135/169  lung]
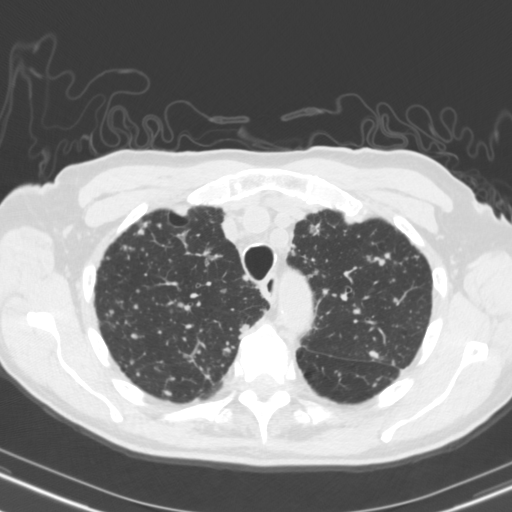
[im 146/169  lung]
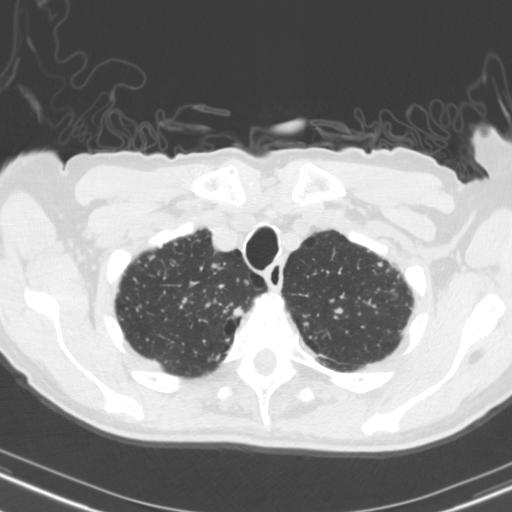
[im 157/169  lung]
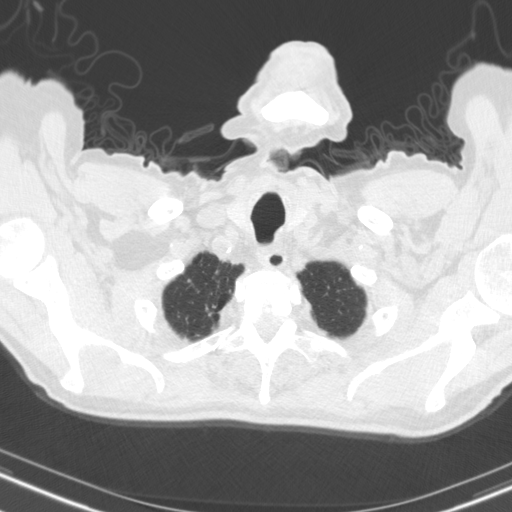

[15 of 32 positions shown; findings below may reference images not displayed]

FINDINGS: Cardiovascular: Heart is normal in size and configuration. Dense
left coronary artery calcifications. No pericardial effusion. There
are aortic atherosclerotic calcifications.

Mediastinum/Nodes: No neck base or axillary masses or enlarged lymph
nodes. No mediastinal or hilar masses or pathologically enlarged
lymph nodes. Trachea is patent. Esophagus is unremarkable.

Lungs/Pleura: There are numerous bilateral pulmonary nodules
involving all lobes. Largest nodules lie in the lower lobes. Largest
on the left, centered on image 97, series 5, measures 16 x 14 mm.
The 2 largest on the right, both centered on image 87, series 5,
measure 18 x 12 mm and 14 x 10 mm.

Lungs also demonstrate mild areas of peripheral scarring and minor
changes of emphysema. There is mild apical pleuroparenchymal
scarring. No evidence of pneumonia. No pulmonary edema. No pleural
effusion or pneumothorax.

Upper Abdomen: No acute findings. No evidence of metastatic disease.

Musculoskeletal: Multiple areas of bony sclerosis consistent with
metastatic disease. This is most evident in the T5 vertebra. No
fractures. No osteolytic lesions.
IMPRESSION: 1. Numerous bilateral pulmonary nodules consistent with widespread
pulmonary metastatic disease, presumably from the patient's known
prostate carcinoma.
2. No acute findings.  No evidence of pneumonia or pulmonary edema.
3. Coronary artery calcifications.  Aortic atherosclerosis.
4. Sclerotic bone lesions reflecting metastatic prostate carcinoma.

Aortic Atherosclerosis (HI4SH-IQ1.1) and Emphysema (HI4SH-ADL.T).

## 2018-06-23 ENCOUNTER — Encounter: Payer: Self-pay | Admitting: Family

## 2018-06-23 ENCOUNTER — Ambulatory Visit (INDEPENDENT_AMBULATORY_CARE_PROVIDER_SITE_OTHER): Payer: Medicare HMO | Admitting: Family

## 2018-06-23 ENCOUNTER — Other Ambulatory Visit: Payer: Self-pay

## 2018-06-23 DIAGNOSIS — Z0000 Encounter for general adult medical examination without abnormal findings: Secondary | ICD-10-CM

## 2018-06-23 DIAGNOSIS — R42 Dizziness and giddiness: Secondary | ICD-10-CM | POA: Diagnosis not present

## 2018-06-23 MED ORDER — MECLIZINE HCL 25 MG PO CHEW: CHEWABLE_TABLET | ORAL | 1 refills | Status: DC

## 2018-06-23 NOTE — Progress Notes (Signed)
     This service is provided via telemedicine  No vital signs collected/recorded due to the encounter was a telemedicine visit.   Location of patient (ex: home, work):  Home  Patient consents to a telephone visit:  Yes  Location of the provider (ex: office, home):  Office    Names of all persons participating in the telemedicine service and their role in the encounter:  Ruthell Rummage CMA, Dinah Ngetich NP, Kendal Hymen   Time spent on call:  Ruthell Rummage CMA, Spent 13 Minutes on phone with patient

## 2018-06-23 NOTE — Patient Instructions (Signed)
[name] , Thank you for taking time to come for your Medicare Wellness Visit. I appreciate your ongoing commitment to your health goals. Please review the following plan we discussed and let me know if I can assist you in the future.   Screening recommendations/referrals: Colonoscopy: N/A  Recommended yearly ophthalmology/optometry visit for glaucoma screening and checkup Recommended yearly dental visit for hygiene and checkup  Vaccinations: Influenza vaccine: Up to date  Pneumococcal vaccine: up to date  Tdap vaccine: up to date  Shingles vaccine: Declined   Advanced directives: No information mailed.   Conditions/risks identified: advance age men > 66 years,male Gender,Hypertension,Hx smoking   Next appointment: 1 year   Preventive Care 12 Years and Older, Male Preventive care refers to lifestyle choices and visits with your health care provider that can promote health and wellness. What does preventive care include?  A yearly physical exam. This is also called an annual well check.  Dental exams once or twice a year.  Routine eye exams. Ask your health care provider how often you should have your eyes checked.  Personal lifestyle choices, including:  Daily care of your teeth and gums.  Regular physical activity.  Eating a healthy diet.  Avoiding tobacco and drug use.  Limiting alcohol use.  Practicing safe sex.  Taking low doses of aspirin every day.  Taking vitamin and mineral supplements as recommended by your health care provider. What happens during an annual well check? The services and screenings done by your health care provider during your annual well check will depend on your age, overall health, lifestyle risk factors, and family history of disease. Counseling  Your health care provider may ask you questions about your:  Alcohol use.  Tobacco use.  Drug use.  Emotional well-being.  Home and relationship well-being.  Sexual activity.   Eating habits.  History of falls.  Memory and ability to understand (cognition).  Work and work Statistician. Screening  You may have the following tests or measurements:  Height, weight, and BMI.  Blood pressure.  Lipid and cholesterol levels. These may be checked every 5 years, or more frequently if you are over 3 years old.  Skin check.  Lung cancer screening. You may have this screening every year starting at age 26 if you have a 30-pack-year history of smoking and currently smoke or have quit within the past 15 years.  Fecal occult blood test (FOBT) of the stool. You may have this test every year starting at age 93.  Flexible sigmoidoscopy or colonoscopy. You may have a sigmoidoscopy every 5 years or a colonoscopy every 10 years starting at age 84.  Prostate cancer screening. Recommendations will vary depending on your family history and other risks.  Hepatitis C blood test.  Hepatitis B blood test.  Sexually transmitted disease (STD) testing.  Diabetes screening. This is done by checking your blood sugar (glucose) after you have not eaten for a while (fasting). You may have this done every 1-3 years.  Abdominal aortic aneurysm (AAA) screening. You may need this if you are a current or former smoker.  Osteoporosis. You may be screened starting at age 44 if you are at high risk. Talk with your health care provider about your test results, treatment options, and if necessary, the need for more tests. Vaccines  Your health care provider may recommend certain vaccines, such as:  Influenza vaccine. This is recommended every year.  Tetanus, diphtheria, and acellular pertussis (Tdap, Td) vaccine. You may need a Td  booster every 10 years.  Zoster vaccine. You may need this after age 36.  Pneumococcal 13-valent conjugate (PCV13) vaccine. One dose is recommended after age 60.  Pneumococcal polysaccharide (PPSV23) vaccine. One dose is recommended after age 41. Talk to your  health care provider about which screenings and vaccines you need and how often you need them. This information is not intended to replace advice given to you by your health care provider. Make sure you discuss any questions you have with your health care provider. Document Released: 02/16/2015 Document Revised: 10/10/2015 Document Reviewed: 11/21/2014 Elsevier Interactive Patient Education  2017 [employer] Prevention in the Home Falls can cause injuries. They can happen to people of all ages. There are many things you can do to make your home safe and to help prevent falls. What can I do on the outside of my home?  Regularly fix the edges of walkways and driveways and fix any cracks.  Remove anything that might make you trip as you walk through a door, such as a raised step or threshold.  Trim any bushes or trees on the path to your home.  Use bright outdoor lighting.  Clear any walking paths of anything that might make someone trip, such as rocks or tools.  Regularly check to see if handrails are loose or broken. Make sure that both sides of any steps have handrails.  Any raised decks and porches should have guardrails on the edges.  Have any leaves, snow, or ice cleared regularly.  Use sand or salt on walking paths during winter.  Clean up any spills in your garage right away. This includes oil or grease spills. What can I do in the bathroom?  Use night lights.  Install grab bars by the toilet and in the tub and shower. Do not use towel bars as grab bars.  Use non-skid mats or decals in the tub or shower.  If you need to sit down in the shower, use a plastic, non-slip stool.  Keep the floor dry. Clean up any water that spills on the floor as soon as it happens.  Remove soap buildup in the tub or shower regularly.  Attach bath mats securely with double-sided non-slip rug tape.  Do not have throw rugs and other things on the floor that can make you trip. What  can I do in the bedroom?  Use night lights.  Make sure that you have a light by your bed that is easy to reach.  Do not use any sheets or blankets that are too big for your bed. They should not hang down onto the floor.  Have a firm chair that has side arms. You can use this for support while you get dressed.  Do not have throw rugs and other things on the floor that can make you trip. What can I do in the kitchen?  Clean up any spills right away.  Avoid walking on wet floors.  Keep items that you use a lot in easy-to-reach places.  If you need to reach something above you, use a strong step stool that has a grab bar.  Keep electrical cords out of the way.  Do not use floor polish or wax that makes floors slippery. If you must use wax, use non-skid floor wax.  Do not have throw rugs and other things on the floor that can make you trip. What can I do with my stairs?  Do not leave any items on the stairs.  Make sure that there are handrails on both sides of the stairs and use them. Fix handrails that are broken or loose. Make sure that handrails are as long as the stairways.  Check any carpeting to make sure that it is firmly attached to the stairs. Fix any carpet that is loose or worn.  Avoid having throw rugs at the top or bottom of the stairs. If you do have throw rugs, attach them to the floor with carpet tape.  Make sure that you have a light switch at the top of the stairs and the bottom of the stairs. If you do not have them, ask someone to add them for you. What else can I do to help prevent falls?  Wear shoes that:  Do not have high heels.  Have rubber bottoms.  Are comfortable and fit you well.  Are closed at the toe. Do not wear sandals.  If you use a stepladder:  Make sure that it is fully opened. Do not climb a closed stepladder.  Make sure that both sides of the stepladder are locked into place.  Ask someone to hold it for you, if possible.   Clearly mark and make sure that you can see:  Any grab bars or handrails.  First and last steps.  Where the edge of each step is.  Use tools that help you move around (mobility aids) if they are needed. These include:  Canes.  Walkers.  Scooters.  Crutches.  Turn on the lights when you go into a dark area. Replace any light bulbs as soon as they burn out.  Set up your furniture so you have a clear path. Avoid moving your furniture around.  If any of your floors are uneven, fix them.  If there are any pets around you, be aware of where they are.  Review your medicines with your doctor. Some medicines can make you feel dizzy. This can increase your chance of falling. Ask your doctor what other things that you can do to help prevent falls. This information is not intended to replace advice given to you by your health care provider. Make sure you discuss any questions you have with your health care provider. Document Released: 11/16/2008 Document Revised: 06/28/2015 Document Reviewed: 02/24/2014 Elsevier Interactive Patient Education  2017 [employer].

## 2018-06-23 NOTE — Progress Notes (Signed)
Subjective:   Troy Ortiz is a 83 y.o. male who presents for Medicare Annual/Subsequent preventive examination.  Review of Systems:  Cardiac Risk Factors include: advanced age (>35men, >41 women);hypertension;male gender;smoking/ tobacco exposure     Objective:    Vitals: There were no vitals taken for this visit.  There is no height or weight on file to calculate BMI.  Advanced Directives 06/23/2018 11/25/2017 04/28/2017 10/21/2016 07/30/2016 04/03/2016 04/03/2016  Does Patient Have a Medical Advance Directive? No No No No No No No  Type of Advance Directive - - - - - - -  Does patient want to make changes to medical advance directive? No - Patient declined - - - - - -  Copy of Press photographer in [place]  Would patient like information on creating a medical advance directive? - - Yes (MAU/Ambulatory/Procedural Areas - Information given) Yes (ED - Information included in AVS) No - Patient declined Yes (ED - Information included in AVS) Yes (MAU/Ambulatory/Procedural Areas - Information given)    Tobacco Social History   Tobacco Use  Smoking Status Former Smoker  . Years: 30.00  Smokeless Tobacco Never Used  Tobacco Comment   Quit at age 38     Counseling given: Not Answered Comment: Quit at age 24   Clinical Intake:  Pre-visit preparation completed: No  Pain : 0-10 Pain Score: 6  Pain Type: Chronic pain Pain Location: Back Pain Orientation: Lower Pain Radiating Towards: no Pain Descriptors / Indicators: Aching Pain Onset: More than a month ago Pain Frequency: Intermittent Pain Relieving Factors: using the bathroom  Effect of Pain on Daily Activities: yes in the morning   Pain Relieving Factors: using the bathroom   Nutritional Risks: None Diabetes: No  How often do you need to have someone help you when you read instructions, pamphlets, or other written materials from your doctor or pharmacy?: 1 - Never What is the last grade level you  completed in school?: college   Interpreter Needed?: No  Information entered by :: Beena Catano FNP-C   Past Medical History:  Diagnosis Date  . Allergic rhinitis, cause unspecified   . Allergy   . Arthritis   . Asthma    AS A CHILD  . Cervicalgia   . Conjunctivitis unspecified   . Dizziness and giddiness   . Enthesopathy of unspecified site   . Generalized hyperhidrosis   . Lumbago   . Neoplasm of uncertain behavior of skin   . Other abnormal glucose   . Other and unspecified hyperlipidemia   . Rash and other nonspecific skin eruption   . Reflux esophagitis   . Stroke [facility])    MINI STROKES  . Unspecified constipation   . Unspecified essential hypertension   . Unspecified late effects of cerebrovascular disease    Past Surgical History:  Procedure Laterality Date  . COLONOSCOPY     Family History  Problem Relation Age of Onset  . Heart disease Mother    Social History   Socioeconomic History  . Marital status: Married    Spouse name: Not on file  . Number of children: 6  . Years of education: Not on file  . Highest education level: Not on file  Occupational History  . 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 Financial resource strain: Not hard at all  . Food insecurity:  Worry: Never true    Inability: Never true  . Transportation needs:    Medical: No    Non-medical: No  Tobacco Use  . Smoking status: Former Smoker    Years: 30.00  . Smokeless tobacco: Never Used  . Tobacco comment: Quit at age 79  Substance and Sexual Activity  . Alcohol use: No  . Drug use: No  . Sexual activity: Yes  Lifestyle  . Physical activity:    Days per week: 0 days    Minutes per session: 0 min  . Stress: Not at all  Relationships  . Social connections:    Talks on phone: More than three times a week    Gets together: More than three times a week    Attends religious service: More than 4 times  per year    Active member of club or organization: No    Attends meetings of clubs or organizations: Never    Relationship status: Married  Other Topics Concern  . Not on file  Social History Narrative  . Not on file    Outpatient Encounter Medications as of 06/23/2018  Medication Sig  . amLODipine (NORVASC) 10 MG tablet Take 1 tablet by mouth once daily  . aspirin 81 MG tablet Take 1 tablet (81 mg total) by mouth daily.  Marland Kitchen ENSURE (ENSURE) Take 237 mLs by mouth 3 (three) times daily between meals.  Marland Kitchen lisinopril (PRINIVIL,ZESTRIL) 5 MG tablet Take 1 tablet (5 mg total) by mouth daily. for high blood pressure  . loratadine (CLARITIN) 10 MG tablet Take 10 mg by mouth as needed for allergies.  . Meclizine HCl (TRAVEL SICKNESS) 25 MG CHEW CHEW AND SWALLOW 1 TABLET BY MOUTH THREE TIMES DAILY AS NEEDED FOR DIZZINESS  . [DISCONTINUED] apalutamide (ERLEADA) 60 MG tablet Take 240 mg by mouth daily. May be taken with or without food. Swallow tablets whole.  . [DISCONTINUED] HYDROcodone-acetaminophen (NORCO) 5-325 MG tablet Take 1 tablet by mouth every 6 (six) hours as needed for moderate pain. For bone pain  . [DISCONTINUED] omeprazole (PRILOSEC) 20 MG capsule Take 20 mg by mouth as needed.    No facility-administered encounter medications on file as of 06/23/2018.     Activities of Daily Living In your present state of health, do you have any difficulty performing the following activities: 06/23/2018  Hearing? N  Vision? N  Difficulty concentrating or making decisions? N  Walking or climbing stairs? N  Dressing or bathing? N  Doing errands, shopping? N  Preparing Food and eating ? N  Using the Toilet? N  In the past six months, have you accidently leaked urine? N  Do you have problems with loss of bowel control? N  Managing your Medications? N  Managing your Finances? N  Housekeeping or managing your Housekeeping? N  Some recent data might be hidden    Patient Care Team: Zyriah Mask, Nelda Bucks, NP as PCP - General (Family Medicine) Kathie Rhodes, MD as Consulting Physician (Urology)   Assessment:   This is a routine wellness examination for Troy Ortiz.  Exercise Activities and Dietary recommendations Current Exercise Habits: Structured exercise class, Type of exercise: stretching, Time (Minutes): 60, Frequency (Times/Week): 4, Weekly Exercise (Minutes/Week): 240, Intensity: Moderate, Exercise limited by: None identified  Goals    . Exercise 3x per week (30 min per time)     Patient will go back to silver sneakers     . Increase water intake     Starting 04/03/16 I will attempt  to increase my water intake to 6 glasses a day.       Fall Risk Fall Risk  06/23/2018 11/25/2017 10/02/2017 07/29/2017 07/14/2017  Falls in the past year? 0 No No No No  Number falls in past yr: 0 - - - -  Injury with Fall? 0 - - - -  Risk for fall due to : - - - - -   Is the patient's home free of loose throw rugs in walkways, pet beds, electrical cords, etc?   no      Grab bars in the bathroom? no      Handrails on the stairs?  No stairs       Adequate lighting?   yes  Depression Screen PHQ 2/9 Scores 06/23/2018 10/02/2017 04/28/2017 01/20/2017  PHQ - 2 Score 0 0 0 0    Cognitive Function MMSE - Mini Mental State Exam 04/28/2017 04/03/2016 02/22/2016 08/29/2015 07/28/2014  Orientation to time 5 5 5 5 4   Orientation to Place 5 4 5 5 5   Registration 3 3 3 3 3   Attention/ Calculation 5 5 5 5 5   Recall 1 3 1  0 2  Language- name 2 objects 2 2 2 2 2   Language- repeat 1 1 1 1 1   Language- follow 3 step command 3 3 3 3 3   Language- read & follow direction 1 1 1 1 1   Write a sentence 1 1 1 1 1   Copy design 1 1 1 1 1   Total score 28 29 28 27 28      6CIT Screen 06/23/2018  What Year? 0 points  What month? 0 points  What time? 0 points  Count back from 20 0 points  Months in reverse 0 points  Repeat phrase 0 points  Total Score 0    Immunization History  Administered Date(s) Administered  .  Influenza Split 12/20/2009, 10/25/2011  . Influenza, High Dose Seasonal PF 09/30/2016  . Influenza,inj,Quad PF,6+ Mos 01/24/2013, 12/23/2013  . Influenza-Unspecified 12/20/2014, 11/28/2015  . Pneumococcal Conjugate-13 08/07/2014  . Pneumococcal Polysaccharide-23 11/28/2015    Qualifies for Shingles Vaccine? Declined  Screening Tests Health Maintenance  Topic Date Due  . TETANUS/TDAP  07/19/2020 (Originally 11/05/1954)  . INFLUENZA VACCINE  09/04/2018  . PNA vac Low Risk Adult  Completed  . COLONOSCOPY  Discontinued   Cancer Screenings: Lung: Low Dose CT Chest recommended if Age 41-80 years, 30 pack-year currently smoking OR have quit w/in 15years. Patient does not qualify. Colorectal: N/A  Additional Screenings: Hepatitis C Screening: low Risk    Plan:   - Advance directive information to be mailed.   I have personally reviewed and noted the following in the patient's chart:   . Medical and social history . Use of alcohol, tobacco or illicit drugs  . Current medications and supplements . Functional ability and status . Nutritional status . Physical activity . Advanced directives . List of other physicians . Hospitalizations, surgeries, and ER visits in previous 12 months . Vitals . Screenings to include cognitive, depression, and falls . Referrals and appointments  In addition, I have reviewed and discussed with patient certain preventive protocols, quality metrics, and best practice recommendations. A written personalized care plan for preventive services as well as general preventive health recommendations were provided to patient.   Sandrea Hughs, NP  06/23/2018

## 2018-08-19 ENCOUNTER — Ambulatory Visit: Payer: Medicare HMO | Admitting: Internal Medicine

## 2018-08-20 ENCOUNTER — Telehealth: Payer: Self-pay

## 2018-08-20 ENCOUNTER — Other Ambulatory Visit: Payer: Self-pay

## 2018-08-20 ENCOUNTER — Ambulatory Visit (INDEPENDENT_AMBULATORY_CARE_PROVIDER_SITE_OTHER): Payer: Medicare HMO | Admitting: Family

## 2018-08-20 ENCOUNTER — Encounter: Payer: Self-pay | Admitting: Family

## 2018-08-20 DIAGNOSIS — J301 Allergic rhinitis due to pollen: Secondary | ICD-10-CM | POA: Diagnosis not present

## 2018-08-20 DIAGNOSIS — E782 Mixed hyperlipidemia: Secondary | ICD-10-CM

## 2018-08-20 DIAGNOSIS — I10 Essential (primary) hypertension: Secondary | ICD-10-CM | POA: Diagnosis not present

## 2018-08-20 DIAGNOSIS — R42 Dizziness and giddiness: Secondary | ICD-10-CM

## 2018-08-20 MED ORDER — MECLIZINE HCL 25 MG PO CHEW: CHEWABLE_TABLET | ORAL | 1 refills | Status: AC

## 2018-08-20 NOTE — Telephone Encounter (Signed)
Tried 1st attempt to call patient for his scheduled telephone visit with Dinah Ngetich Np today. LMOM for patient to return call.

## 2018-08-20 NOTE — Telephone Encounter (Signed)
Made a 2nd attempt to contact patient regarding telephone visit. No response. LMOM for patient to call office.

## 2018-08-20 NOTE — Progress Notes (Signed)
Location:      Place of Service:    Provider: Bennett Vanscyoc FNP-C   [place] Hantz, Nelda Bucks, NP  Patient Care Team: Charly Hunton, Nelda Bucks, NP as PCP - General (Family Medicine) Kathie Rhodes, MD as Consulting Physician (Urology)  Extended Emergency Contact Information Primary Emergency Contact: Evelina Dun Address: [address]          [place], [place] [address] Johnnette Litter of [place] Phone: [phone] Relation: Spouse Secondary Emergency Contact: king, demietrice Mobile Phone: [phone] Relation: Daughter  Code Status: Full Code  Goals of care: Advanced Directive information Advanced Directives 08/20/2018  Does Patient Have a Medical Advance Directive? No  Type of Advance Directive -  Does patient want to make changes to medical advance directive? No - Patient declined  Copy of [place] in Chart? -  Would patient like information on creating a medical advance directive? -    This service is provided via telemedicine  No vital signs collected/recorded due to the encounter was a telemedicine visit.   Location of patient (ex: home, work):  Home   Patient consents to a telephone visit:  Yes   Location of the provider (ex: office, home):  Office   Name of any referring provider: N/A  Names of all persons participating in the telemedicine service and their role in the encounter:  Marlowe Sax, NP, Ruthell Rummage CMA, Kendal Hymen   Time spent on call:  Ruthell Rummage, [place] spent 8 Minutes on phone with patient.    Chief Complaint  Patient presents with  . Medical Management of Chronic Issues    Follow up     HPI:  Pt is a 83 y.o. male seen today  for medical management of chronic diseases.He denies any acute issues.Has has had no recent fall episode or weight changes.He states his appetite has been good " I like food".He wears his Arts development officer and practices social distancing per CVID-19 restrictions.He states has not been in contact  with persons sick with COVID-19 that he can recall. He continues to follow up with Urology for prostate cancer.   Hypertension - Recall B/p 122/77 yesterday.Taking amlodipine 10 mg tablet daily and lisinopril 5 mg tablet daily.on ASA 81 mg tablet for chest pain prophylaxis.Hedenies any headache,changes in vision,chest pain or shortness of breath.  Hyperlipidemia - currently on no medication.He tries to eat healthy.He would like to get back with the elderly exercise group sneakers but still closed due to COVID-19 restrictions.He does some  Walking exercise at home.    Allergies - symptoms controlled.Takes loratadine 10 mg tablet as needed whenever he has nasal congestion.   Dizziness -No recent symptoms.takes meclizine 25 mg tablet three times daily as needed.      Past Medical History:  Diagnosis Date  . Allergic rhinitis, cause unspecified   . Allergy   . Arthritis   . Asthma    AS A CHILD  . Cervicalgia   . Conjunctivitis unspecified   . Dizziness and giddiness   . Enthesopathy of unspecified site   . Generalized hyperhidrosis   . Lumbago   . Neoplasm of uncertain behavior of skin   . Other abnormal glucose   . Other and unspecified hyperlipidemia   . Rash and other nonspecific skin eruption   . Reflux esophagitis   . Stroke [facility])    MINI STROKES  . Unspecified constipation   . Unspecified essential hypertension   . Unspecified late effects of cerebrovascular disease    Past Surgical  History:  Procedure Laterality Date  . COLONOSCOPY      No Known Allergies  Allergies as of 08/20/2018   No Known Allergies     Medication List       Accurate as of August 20, 2018 11:40 AM. If you have any questions, ask your nurse or doctor.        amLODipine 10 MG tablet Commonly known as: NORVASC Take 1 tablet by mouth once daily   aspirin 81 MG tablet Take 1 tablet (81 mg total) by mouth daily.   Claritin 10 MG tablet Generic drug: loratadine Take 10 mg by mouth as needed  for allergies.   Ensure Take 237 mLs by mouth 3 (three) times daily between meals.   lisinopril 5 MG tablet Commonly known as: ZESTRIL Take 1 tablet (5 mg total) by mouth daily. for high blood pressure   Meclizine HCl 25 MG Chew Commonly known as: Technical sales engineer AND SWALLOW 1 TABLET BY MOUTH THREE TIMES DAILY AS NEEDED FOR DIZZINESS       Review of Systems  Constitutional: Negative for appetite change, chills, fatigue and fever.  HENT: Negative for congestion, ear pain, rhinorrhea, sinus pressure, sinus pain, sneezing, sore throat and trouble swallowing.        Wears Left ear hearing   Eyes: Negative for discharge, redness, itching and visual disturbance.  Respiratory: Negative for cough, chest tightness, shortness of breath and wheezing.   Cardiovascular: Negative for chest pain, palpitations and leg swelling.  Gastrointestinal: Negative for abdominal distention, abdominal pain, constipation, diarrhea, nausea and vomiting.  Endocrine: Negative for cold intolerance, heat intolerance, polydipsia, polyphagia and polyuria.  Genitourinary: Positive for urgency. Negative for difficulty urinating, dysuria, flank pain and frequency.  Musculoskeletal: Positive for back pain. Negative for neck pain and neck stiffness.  Skin: Negative for color change, pallor and rash.  Neurological: Negative for dizziness, weakness, light-headedness, numbness and headaches.  Hematological: Does not bruise/bleed easily.  Psychiatric/Behavioral: Negative for agitation, confusion and sleep disturbance. The patient is not nervous/anxious.     Immunization History  Administered Date(s) Administered  . Influenza Split 12/20/2009, 10/25/2011  . Influenza, High Dose Seasonal PF 09/30/2016  . Influenza,inj,Quad PF,6+ Mos 01/24/2013, 12/23/2013  . Influenza-Unspecified 12/20/2014, 11/28/2015  . Pneumococcal Conjugate-13 08/07/2014  . Pneumococcal Polysaccharide-23 11/28/2015   Pertinent  Health  Maintenance Due  Topic Date Due  . INFLUENZA VACCINE  09/04/2018  . PNA vac Low Risk Adult  Completed  . COLONOSCOPY  Discontinued   Fall Risk  08/20/2018 06/23/2018 11/25/2017 10/02/2017 07/29/2017  Falls in the past year? 0 0 No No No  Number falls in past yr: 0 0 - - -  Injury with Fall? 0 0 - - -  Risk for fall due to : - - - - -    There were no vitals filed for this visit. There is no height or weight on file to calculate BMI. Physical Exam Unable to complete   Labs reviewed: Recent Labs    09/17/17 1137  NA 143  K 4.1  CL 110  CO2 27  GLUCOSE 73  BUN 13  CREATININE 0.66*  CALCIUM 8.2*   Recent Labs    09/17/17 1137  AST 13  ALT 3*  BILITOT 0.2  PROT 5.4*   Recent Labs    09/17/17 1137  WBC 4.4  HGB 9.0*  HCT 27.2*  MCV 88.6  PLT 247   Lab Results  Component Value Date   TSH 3.57 10/02/2017  Lab Results  Component Value Date   HGBA1C (H) 08/08/2008    6.2 (NOTE) The ADA recommends the following therapeutic goal for glycemic control related to Hgb A1c measurement: Goal of therapy: <6.5 Hgb A1c  Reference: American Diabetes Association: Clinical Practice Recommendations 2010, Diabetes Care, 2010, 33: (Suppl  1).   Lab Results  Component Value Date   CHOL 225 (H) 10/02/2017   HDL 44 10/02/2017   LDLCALC 152 (H) 10/02/2017   TRIG 156 (H) 10/02/2017   CHOLHDL 5.1 (H) 10/02/2017    Significant Diagnostic Results in last 30 days:  No results found.  Assessment/Plan 1.  Essential hypertension, benign B/p at goal.continue on amlodipine 10 mg tablet daily and lisinopril 5 mg tablet daily. ASA 81 mg tablet for chest pain prophylaxis. - CBC/diff,future  -CMP,future  -TSH level ,Future   2. Dizziness Chronic.Takes Meclizine as needed has been effective  - Meclizine HCl (TRAVEL SICKNESS) 25 MG CHEW; CHEW AND SWALLOW 1 TABLET BY MOUTH THREE TIMES DAILY AS NEEDED FOR DIZZINESS  Dispense: 30 tablet; Refill: 1 - CBC/diff,futre   3. Mixed  hyperlipidemia Encouraged low carbohydrate,low saturated fats and high vegetable diet.exercise /increase physical activity as tolerated. - fasting Lipid panel,future  4. Chronic allergic Rhinitis  Symptoms controlled.continue on loratadine 10 mg tablet as needed.   Family/ staff Communication: Reviewed plan of care with patient  Labs/tests ordered: - CBC/diff,future  -CMP,future  -TSH level ,Future  - fasting Lipid panel,future   Spent 25 minutes of non-face to face with patient   Sandrea Hughs, NP

## 2018-08-24 ENCOUNTER — Other Ambulatory Visit: Payer: Medicare HMO

## 2018-08-24 ENCOUNTER — Other Ambulatory Visit: Payer: Self-pay

## 2018-08-24 DIAGNOSIS — I10 Essential (primary) hypertension: Secondary | ICD-10-CM

## 2018-08-24 DIAGNOSIS — E782 Mixed hyperlipidemia: Secondary | ICD-10-CM

## 2018-08-25 LAB — LIPID PANEL
Cholesterol: 197 mg/dL (ref <200)
HDL: 51 mg/dL (ref >=40)
LDL Cholesterol (Calc): 128 mg/dL (calc) — ABNORMAL HIGH
Non-HDL Cholesterol (Calc): 146 mg/dL (calc) — ABNORMAL HIGH (ref <130)
Total CHOL/HDL Ratio: 3.9 (calc) (ref <5.0)
Triglycerides: 79 mg/dL (ref <150)

## 2018-08-25 LAB — CBC WITH DIFFERENTIAL/PLATELET
Absolute Monocytes: 269 cells/uL (ref 200–950)
Basophils Absolute: 29 cells/uL (ref 0–200)
Basophils Relative: 0.7 %
Eosinophils Absolute: 181 cells/uL (ref 15–500)
Eosinophils Relative: 4.3 %
HCT: 31.9 % — ABNORMAL LOW (ref 38.5–50.0)
Hemoglobin: 10.5 g/dL — ABNORMAL LOW (ref 13.2–17.1)
Lymphs Abs: 1634 cells/uL (ref 850–3900)
MCH: 28.9 pg (ref 27.0–33.0)
MCHC: 32.9 g/dL (ref 32.0–36.0)
MCV: 87.9 fL (ref 80.0–100.0)
MPV: 10.8 fL (ref 7.5–12.5)
Monocytes Relative: 6.4 %
Neutro Abs: 2087 cells/uL (ref 1500–7800)
Neutrophils Relative %: 49.7 %
Platelets: 251 10*3/uL (ref 140–400)
RBC: 3.63 10*6/uL — ABNORMAL LOW (ref 4.20–5.80)
RDW: 13.3 % (ref 11.0–15.0)
Total Lymphocyte: 38.9 %
WBC: 4.2 10*3/uL (ref 3.8–10.8)

## 2018-08-25 LAB — TSH: TSH: 1.68 mIU/L (ref 0.40–4.50)

## 2018-08-25 LAB — COMPLETE METABOLIC PANEL WITH GFR
AG Ratio: 1.7 (calc) (ref 1.0–2.5)
ALT: 5 U/L — ABNORMAL LOW (ref 9–46)
AST: 14 U/L (ref 10–35)
Albumin: 4.1 g/dL (ref 3.6–5.1)
Alkaline phosphatase (APISO): 77 U/L (ref 35–144)
BUN: 14 mg/dL (ref 7–25)
CO2: 28 mmol/L (ref 20–32)
Calcium: 9.6 mg/dL (ref 8.6–10.3)
Chloride: 109 mmol/L (ref 98–110)
Creat: 0.75 mg/dL (ref 0.70–1.11)
GFR, Est African American: 100 mL/min/{1.73_m2} (ref >=60)
GFR, Est Non African American: 87 mL/min/{1.73_m2} (ref >=60)
Globulin: 2.4 g/dL (calc) (ref 1.9–3.7)
Glucose, Bld: 99 mg/dL (ref 65–99)
Potassium: 4 mmol/L (ref 3.5–5.3)
Sodium: 144 mmol/L (ref 135–146)
Total Bilirubin: 0.3 mg/dL (ref 0.2–1.2)
Total Protein: 6.5 g/dL (ref 6.1–8.1)

## 2018-11-10 ENCOUNTER — Ambulatory Visit (INDEPENDENT_AMBULATORY_CARE_PROVIDER_SITE_OTHER): Payer: Medicare HMO | Admitting: Family

## 2018-11-10 ENCOUNTER — Encounter: Payer: Self-pay | Admitting: Family

## 2018-11-10 ENCOUNTER — Other Ambulatory Visit: Payer: Self-pay

## 2018-11-10 VITALS — BP 130/70 | HR 50 | Temp 97.7°F | Ht 65.0 in | Wt 143.4 lb

## 2018-11-10 DIAGNOSIS — R410 Disorientation, unspecified: Secondary | ICD-10-CM

## 2018-11-10 DIAGNOSIS — Z23 Encounter for immunization: Secondary | ICD-10-CM | POA: Diagnosis not present

## 2018-11-10 DIAGNOSIS — G3184 Mild cognitive impairment, so stated: Secondary | ICD-10-CM | POA: Diagnosis not present

## 2018-11-10 LAB — POCT URINALYSIS DIPSTICK
Blood, UA: NEGATIVE
Glucose, UA: NEGATIVE
Ketones, UA: NEGATIVE
Leukocytes, UA: NEGATIVE
Nitrite, UA: NEGATIVE
Protein, UA: POSITIVE — AB
Spec Grav, UA: >=1.03 — AB (ref 1.010–1.025)
Urobilinogen, UA: 0.2 E.U./dL
pH, UA: 5 (ref 5.0–8.0)

## 2018-11-10 NOTE — Progress Notes (Signed)
Provider: Marlowe Sax FNP-C  , Nelda Bucks, NP  Patient Care Team: , Nelda Bucks, NP as PCP - General (Family Medicine) Kathie Rhodes, MD as Consulting Physician (Urology)  Extended Emergency Contact Information Primary Emergency Contact: Evelina Dun Address: [address]          [place], [place] [address] Johnnette Litter of [place] Phone: [phone] Relation: Spouse Secondary Emergency Contact: king, demietrice Mobile Phone: [phone] Relation: Daughter  Code Status:  Full Code  Goals of care: Advanced Directive information Advanced Directives 08/20/2018  Does Patient Have a Medical Advance Directive? No  Type of Advance Directive -  Does patient want to make changes to medical advance directive? No - Patient declined  Copy of [place] in Chart? -  Would patient like information on creating a medical advance directive? -     Chief Complaint  Patient presents with  . Acute Visit    MMSE 23/30 passed clock test Friday night patient experienced confusion where patient drove off, family had to issue a silver alert for patient and he was discovered in [facility] Saturday morning arount 8 am. Patient does not remember doing this   . Medication Management    patient is forgetting to take medications     HPI:  Pt is a 83 y.o. male seen today [place] office for an acute visit for evaluation of confusion.He is here with her daughter.she states Friday night patient had some  confusion het drove off. Family had to issue a silver alert for patient and he was discovered in [facility] Saturday morning arount 8 am by the Police. Her daughter was contacted and went to pick him up.Patient does not remember doing driving to [place]. Daughter also states he has been forgetting to take his medication or give his wife whom he has been taking care.He denies any fever,chills,cough or signs of UTI though HPI limited due to answering to yes or No questions.     Past Medical History:  Diagnosis Date  . Allergic rhinitis, cause unspecified   . Allergy   . Arthritis   . Asthma    AS A CHILD  . Cervicalgia   . Conjunctivitis unspecified   . Dizziness and giddiness   . Enthesopathy of unspecified site   . Generalized hyperhidrosis   . Lumbago   . Neoplasm of uncertain behavior of skin   . Other abnormal glucose   . Other and unspecified hyperlipidemia   . Rash and other nonspecific skin eruption   . Reflux esophagitis   . Stroke [facility])    MINI STROKES  . Unspecified constipation   . Unspecified essential hypertension   . Unspecified late effects of cerebrovascular disease    Past Surgical History:  Procedure Laterality Date  . COLONOSCOPY      No Known Allergies  Outpatient Encounter Medications as of 11/10/2018  Medication Sig  . amLODipine (NORVASC) 10 MG tablet Take 1 tablet by mouth once daily  . apalutamide (ERLEADA) 60 MG tablet Take 240 mg by mouth daily. May be taken with or without food. Swallow tablets whole.  Marland Kitchen aspirin 81 MG tablet Take 1 tablet (81 mg total) by mouth daily.  . Darolutamide (NUBEQA) 300 MG TABS Take 300 mg by mouth 2 (two) times daily.  Marland Kitchen ENSURE (ENSURE) Take 237 mLs by mouth 3 (three) times daily between meals.  Marland Kitchen lisinopril (PRINIVIL,ZESTRIL) 5 MG tablet Take 1 tablet (5 mg total) by mouth daily. for high blood pressure  .  loratadine (CLARITIN) 10 MG tablet Take 10 mg by mouth as needed for allergies.  . Meclizine HCl (TRAVEL SICKNESS) 25 MG CHEW CHEW AND SWALLOW 1 TABLET BY MOUTH THREE TIMES DAILY AS NEEDED FOR DIZZINESS  . Multiple Vitamins-Minerals (CENTRUM SILVER) tablet Take 1 tablet by mouth daily.   No facility-administered encounter medications on file as of 11/10/2018.     Review of Systems  Constitutional: Negative for appetite change, chills, fatigue and fever.  HENT: Negative for congestion, rhinorrhea, sinus pressure, sinus pain, sneezing and sore throat.   Respiratory: Negative for  cough, chest tightness, shortness of breath and wheezing.   Cardiovascular: Negative for chest pain, palpitations and leg swelling.  Gastrointestinal: Negative for abdominal distention, abdominal pain, constipation, diarrhea, nausea and vomiting.  Genitourinary: Negative for difficulty urinating, dysuria, flank pain, frequency and urgency.  Musculoskeletal: Positive for gait problem.  Skin: Negative for color change, pallor and rash.  Neurological: Negative for dizziness, weakness, light-headedness, numbness and headaches.  Hematological: Does not bruise/bleed easily.  Psychiatric/Behavioral: Positive for confusion. Negative for agitation and sleep disturbance. The patient is not nervous/anxious.     Immunization History  Administered Date(s) Administered  . Fluad Quad(high Dose 65+) 11/10/2018  . Influenza Split 12/20/2009, 10/25/2011  . Influenza, High Dose Seasonal PF 09/30/2016  . Influenza,inj,Quad PF,6+ Mos 01/24/2013, 12/23/2013  . Influenza-Unspecified 12/20/2014, 11/28/2015  . Pneumococcal Conjugate-13 08/07/2014  . Pneumococcal Polysaccharide-23 11/28/2015   Pertinent  Health Maintenance Due  Topic Date Due  . INFLUENZA VACCINE  09/04/2018  . PNA vac Low Risk Adult  Completed  . COLONOSCOPY  Discontinued   Fall Risk  11/10/2018 08/20/2018 06/23/2018 11/25/2017 10/02/2017  Falls in the past year? 0 0 0 No No  Number falls in past yr: 0 0 0 - -  Injury with Fall? 0 0 0 - -  Risk for fall due to : - - - - -    Vitals:   11/10/18 1452  BP: 130/70  Pulse: (!) 50  Temp: 97.7 F (36.5 C)  TempSrc: Temporal  SpO2: 98%  Weight: 143 lb 6.4 oz (65 kg)  Height: _0  (1.651 m)   Body mass index is 23.86 kg/m. Physical Exam Vitals signs reviewed.  Constitutional:      General: He is not in acute distress.    Appearance: He is normal weight. He is not ill-appearing.  HENT:     Head: Normocephalic.     Right Ear: Tympanic membrane, ear canal and external ear normal. There  is no impacted cerumen.     Left Ear: Tympanic membrane, ear canal and external ear normal. There is no impacted cerumen.     Nose: Nose normal. No congestion or rhinorrhea.     Mouth/Throat:     Mouth: Mucous membranes are moist.     Pharynx: Oropharynx is clear. No oropharyngeal exudate or posterior oropharyngeal erythema.  Eyes:     General: No scleral icterus.       Right eye: No discharge.        Left eye: No discharge.     Extraocular Movements: Extraocular movements intact.     Conjunctiva/sclera: Conjunctivae normal.     Pupils: Pupils are equal, round, and reactive to light.  Neck:     Musculoskeletal: Normal range of motion. No neck rigidity or muscular tenderness.     Vascular: No carotid bruit.  Cardiovascular:     Rate and Rhythm: Regular rhythm. Bradycardia present.     Pulses: Normal pulses.  Heart sounds: Normal heart sounds. No murmur. No friction rub. No gallop.   Pulmonary:     Effort: Pulmonary effort is normal. No respiratory distress.     Breath sounds: Normal breath sounds. No wheezing, rhonchi or rales.  Chest:     Chest wall: No tenderness.  Abdominal:     General: Bowel sounds are normal. There is no distension.     Palpations: Abdomen is soft. There is no mass.     Tenderness: There is no abdominal tenderness. There is no right CVA tenderness, left CVA tenderness, guarding or rebound.  Musculoskeletal: Normal range of motion.        General: No swelling or tenderness.     Right lower leg: No edema.     Left lower leg: No edema.     Comments: Unsteady gait   Lymphadenopathy:     Cervical: No cervical adenopathy.  Skin:    General: Skin is warm and dry.     Coloration: Skin is not pale.     Findings: No erythema or rash.  Neurological:     Mental Status: He is alert.     Cranial Nerves: No cranial nerve deficit.     Sensory: No sensory deficit.     Motor: No weakness.     Coordination: Coordination normal.     Gait: Gait abnormal.      Comments: Scored 23/30 on MMSE this visit   Psychiatric:        Mood and Affect: Mood normal.        Speech: Speech normal.        Behavior: Behavior normal.        Thought Content: Thought content normal.        Cognition and Memory: Memory is impaired. He exhibits impaired recent memory.     Labs reviewed: Recent Labs    08/24/18 0930  NA 144  K 4.0  CL 109  CO2 28  GLUCOSE 99  BUN 14  CREATININE 0.75  CALCIUM 9.6   Recent Labs    08/24/18 0930  AST 14  ALT 5*  BILITOT 0.3  PROT 6.5   Recent Labs    08/24/18 0930  WBC 4.2  NEUTROABS 2,087  HGB 10.5*  HCT 31.9*  MCV 87.9  PLT 251   Lab Results  Component Value Date   TSH 1.68 08/24/2018   Lab Results  Component Value Date   HGBA1C (H) 08/08/2008    6.2 (NOTE) The ADA recommends the following therapeutic goal for glycemic control related to Hgb A1c measurement: Goal of therapy: <6.5 Hgb A1c  Reference: American Diabetes Association: Clinical Practice Recommendations 2010, Diabetes Care, 2010, 33: (Suppl  1).   Lab Results  Component Value Date   CHOL 197 08/24/2018   HDL 51 08/24/2018   LDLCALC 128 (H) 08/24/2018   TRIG 79 08/24/2018   CHOLHDL 3.9 08/24/2018    Significant Diagnostic Results in last 30 days:  No results found.  Assessment/Plan 1. Need for influenza vaccination Afebrile.No signs of URI's - Flu Vaccine QUAD High Dose(Fluad) given by CMA   2. Confusion Afebrile.Drove car and was found in [facility] by Police after a silver alert was placed.He does not recall driving to [place].Scored 23/30 on MMSE today indicating mild cognitive impairment.Will rule out infectious and metabolic etiologies.   - CBC with Differential/Platelet - CMP with eGFR([facility]) - POC Urinalysis Dipstick - Culture, Urine  3. Mild cognitive impairment Scored 23/30 on MMSE today indicating  mild cognitive impairment.Will rule out infectious and metabolic etiologies.I've discussed with patient and daughter  no more driving given his cognitive impairment and advance age to prevent injury to self and others.Will need re-enforcement.daughter states key has been taken away from patient already. Daughter will also assist with medication management.     Family/ staff Communication: Reviewed plan of care with patient and daughter.  Labs/tests ordered:  - CBC with Differential/Platelet - CMP with eGFR([facility]) - POC Urinalysis Dipstick - Culture, Urine   Nelda Bucks , NP

## 2018-11-11 LAB — COMPLETE METABOLIC PANEL WITH GFR
AG Ratio: 1.5 (calc) (ref 1.0–2.5)
ALT: 5 U/L — ABNORMAL LOW (ref 9–46)
AST: 12 U/L (ref 10–35)
Albumin: 3.8 g/dL (ref 3.6–5.1)
Alkaline phosphatase (APISO): 108 U/L (ref 35–144)
BUN: 20 mg/dL (ref 7–25)
CO2: 31 mmol/L (ref 20–32)
Calcium: 9.1 mg/dL (ref 8.6–10.3)
Chloride: 104 mmol/L (ref 98–110)
Creat: 0.83 mg/dL (ref 0.70–1.11)
GFR, Est African American: 96 mL/min/{1.73_m2} (ref >=60)
GFR, Est Non African American: 83 mL/min/{1.73_m2} (ref >=60)
Globulin: 2.5 g/dL (calc) (ref 1.9–3.7)
Glucose, Bld: 86 mg/dL (ref 65–139)
Potassium: 4.3 mmol/L (ref 3.5–5.3)
Sodium: 140 mmol/L (ref 135–146)
Total Bilirubin: 0.2 mg/dL (ref 0.2–1.2)
Total Protein: 6.3 g/dL (ref 6.1–8.1)

## 2018-11-11 LAB — CBC WITH DIFFERENTIAL/PLATELET
Absolute Monocytes: 289 cells/uL (ref 200–950)
Basophils Absolute: 59 cells/uL (ref 0–200)
Basophils Relative: 1.2 %
Eosinophils Absolute: 181 cells/uL (ref 15–500)
Eosinophils Relative: 3.7 %
HCT: 31.2 % — ABNORMAL LOW (ref 38.5–50.0)
Hemoglobin: 10.6 g/dL — ABNORMAL LOW (ref 13.2–17.1)
Lymphs Abs: 2068 cells/uL (ref 850–3900)
MCH: 29.6 pg (ref 27.0–33.0)
MCHC: 34 g/dL (ref 32.0–36.0)
MCV: 87.2 fL (ref 80.0–100.0)
MPV: 10.9 fL (ref 7.5–12.5)
Monocytes Relative: 5.9 %
Neutro Abs: 2303 cells/uL (ref 1500–7800)
Neutrophils Relative %: 47 %
Platelets: 273 10*3/uL (ref 140–400)
RBC: 3.58 10*6/uL — ABNORMAL LOW (ref 4.20–5.80)
RDW: 13.2 % (ref 11.0–15.0)
Total Lymphocyte: 42.2 %
WBC: 4.9 10*3/uL (ref 3.8–10.8)

## 2018-11-12 LAB — URINE CULTURE
MICRO NUMBER:: 965460
Result:: NO GROWTH
SPECIMEN QUALITY:: ADEQUATE

## 2018-11-16 ENCOUNTER — Other Ambulatory Visit: Payer: Self-pay | Admitting: Family

## 2018-11-16 DIAGNOSIS — R410 Disorientation, unspecified: Secondary | ICD-10-CM

## 2018-11-16 DIAGNOSIS — G3184 Mild cognitive impairment, so stated: Secondary | ICD-10-CM

## 2018-11-30 ENCOUNTER — Other Ambulatory Visit: Payer: Self-pay | Admitting: *Deleted

## 2018-11-30 DIAGNOSIS — I10 Essential (primary) hypertension: Secondary | ICD-10-CM

## 2018-11-30 MED ORDER — LISINOPRIL 5 MG PO TABS: 5.0000 mg | ORAL_TABLET | Freq: Every day | ORAL | 1 refills | Status: DC

## 2018-11-30 MED ORDER — AMLODIPINE BESYLATE 10 MG PO TABS: ORAL_TABLET | ORAL | 1 refills | Status: DC

## 2018-11-30 NOTE — Telephone Encounter (Signed)
Daughter called and requested refills of Blood pressure medications to be sent to pharmacy. Faxed. Also requested updated medication list to be mailed to home. Mailed.

## 2018-12-08 ENCOUNTER — Other Ambulatory Visit: Payer: Self-pay

## 2018-12-08 ENCOUNTER — Encounter: Payer: Self-pay | Admitting: Family

## 2018-12-08 ENCOUNTER — Ambulatory Visit (INDEPENDENT_AMBULATORY_CARE_PROVIDER_SITE_OTHER): Payer: Medicare HMO | Admitting: Family

## 2018-12-08 VITALS — BP 138/68 | HR 51 | Temp 97.8°F | Ht 65.0 in | Wt 145.0 lb

## 2018-12-08 DIAGNOSIS — N3945 Continuous leakage: Secondary | ICD-10-CM

## 2018-12-08 DIAGNOSIS — G3184 Mild cognitive impairment, so stated: Secondary | ICD-10-CM | POA: Diagnosis not present

## 2018-12-08 DIAGNOSIS — C61 Malignant neoplasm of prostate: Secondary | ICD-10-CM

## 2018-12-08 NOTE — Progress Notes (Signed)
Provider: Marlowe Sax FNP-C  Troy Ortiz, Troy Bucks, NP  Patient Care Team: Troy Ortiz, Troy Bucks, NP as PCP - General (Family Medicine) Troy Rhodes, MD as Consulting Physician (Urology)  Extended Emergency Contact Information Primary Emergency Contact: Troy Ortiz Address: [address]          [place], [place] [address] [name] Phone: [phone] Relation: Spouse Secondary Emergency Contact: king, demietrice Mobile Phone: [phone] Relation: Daughter  Code Status:  Goals of care: Advanced Directive information Advanced Directives 08/20/2018  Does Patient Have a Medical Advance Directive? No  Type of Advance Directive -  Does patient want to make changes to medical advance directive? No - Patient declined  Copy of [place] in Chart? -  Would patient like information on creating a medical advance directive? -     Chief Complaint  Patient presents with  . Acute Visit    Would like to discuss need for home health, patient's son is concerned with patient's recent lab   7.78 PSA  and cancer has returned.     HPI:  Pt is a 83 y.o. male seen today for an acute visit for evaluation to discuss need for home health Nurse assistance to assist patient with his ADL's.He is here with his son who is the HCPOA.Patient's son is concerned with patient's recent lab 7.78 PSA  and cancer has returned.Due to his advance age son states not able to undergo treatment.Discussed with patient and son goals of care will read MOST form then discuss with the rest of the siblings.  He states patient has required more assistance with his Activity of daily living.He has had more incontinent episodes and does not remember to wear incontinent depends.Family also helping with patient's wife who has dementia. Patient previous drove car and wonder in different direction.He was found by police after a silver Alert was placed.     Past Medical History:  Diagnosis Date   . Allergic rhinitis, cause unspecified   . Allergy   . Arthritis   . Asthma    AS A CHILD  . Cervicalgia   . Conjunctivitis unspecified   . Dizziness and giddiness   . Enthesopathy of unspecified site   . Generalized hyperhidrosis   . Lumbago   . Neoplasm of uncertain behavior of skin   . Other abnormal glucose   . Other and unspecified hyperlipidemia   . Rash and other nonspecific skin eruption   . Reflux esophagitis   . Stroke [facility])    MINI STROKES  . Unspecified constipation   . Unspecified essential hypertension   . Unspecified late effects of cerebrovascular disease    Past Surgical History:  Procedure Laterality Date  . COLONOSCOPY      No Known Allergies  Outpatient Encounter Medications as of 12/08/2018  Medication Sig  . amLODipine (NORVASC) 10 MG tablet Take 1 tablet by mouth once daily  . aspirin 81 MG tablet Take 1 tablet (81 mg total) by mouth daily.  . Darolutamide (NUBEQA) 300 MG TABS Take 300 mg by mouth 2 (two) times daily.  Marland Kitchen ENSURE (ENSURE) Take 237 mLs by mouth 3 (three) times daily between meals.  Marland Kitchen lisinopril (ZESTRIL) 5 MG tablet Take 1 tablet (5 mg total) by mouth daily. for high blood pressure  . loratadine (CLARITIN) 10 MG tablet Take 10 mg by mouth as needed for allergies.  . Meclizine HCl (TRAVEL SICKNESS) 25 MG CHEW CHEW AND SWALLOW 1 TABLET BY MOUTH THREE TIMES DAILY AS  NEEDED FOR DIZZINESS  . [DISCONTINUED] apalutamide (ERLEADA) 60 MG tablet Take 240 mg by mouth daily. May be taken with or without food. Swallow tablets whole.  . [DISCONTINUED] Multiple Vitamins-Minerals (CENTRUM SILVER) tablet Take 1 tablet by mouth daily.   No facility-administered encounter medications on file as of 12/08/2018.     Review of Systems  Constitutional: Negative for appetite change, chills, fatigue and fever.  HENT: Negative for congestion, rhinorrhea, sinus pressure, sinus pain, sneezing and sore throat.   Eyes: Positive for visual disturbance. Negative for  discharge, redness and itching.  Respiratory: Negative for cough, chest tightness, shortness of breath and wheezing.   Cardiovascular: Negative for chest pain, palpitations and leg swelling.  Gastrointestinal: Negative for abdominal distention, abdominal pain, constipation, diarrhea, nausea and vomiting.  Genitourinary:       Incontinent   Musculoskeletal: Negative for arthralgias and back pain.  Skin: Negative for color change, pallor and rash.  Neurological: Negative for dizziness, light-headedness and headaches.  Psychiatric/Behavioral: Positive for confusion. Negative for sleep disturbance. The patient is not nervous/anxious.     Immunization History  Administered Date(s) Administered  . Fluad Quad(high Dose 65+) 11/10/2018  . Influenza Split 12/20/2009, 10/25/2011  . Influenza, High Dose Seasonal PF 09/30/2016  . Influenza,inj,Quad PF,6+ Mos 01/24/2013, 12/23/2013  . Influenza-Unspecified 12/20/2014, 11/28/2015  . Pneumococcal Conjugate-13 08/07/2014  . Pneumococcal Polysaccharide-23 11/28/2015   Pertinent  Health Maintenance Due  Topic Date Due  . INFLUENZA VACCINE  Completed  . PNA vac Low Risk Adult  Completed  . COLONOSCOPY  Discontinued   Fall Risk  12/08/2018 11/10/2018 08/20/2018 06/23/2018 11/25/2017  Falls in the past year? 0 0 0 0 No  Number falls in past yr: 0 0 0 0 -  Injury with Fall? 0 0 0 0 -  Risk for fall due to : - - - - -    Vitals:   12/08/18 0959  BP: 138/68  Pulse: (!) 51  Temp: 97.8 F (36.6 C)  TempSrc: Temporal  SpO2: 98%  Weight: 145 lb (65.8 kg)  Height: 5\' 5"  (1.651 m)   Body mass index is 24.13 kg/m. Physical Exam Constitutional:      General: He is not in acute distress.    Appearance: He is normal weight. He is not ill-appearing.  HENT:     Head: Normocephalic.     Mouth/Throat:     Mouth: Mucous membranes are moist.     Pharynx: Oropharynx is clear. No oropharyngeal exudate or posterior oropharyngeal erythema.  Eyes:      General: No scleral icterus.       Right eye: No discharge.        Left eye: No discharge.     Conjunctiva/sclera: Conjunctivae normal.     Pupils: Pupils are equal, round, and reactive to light.  Cardiovascular:     Rate and Rhythm: Regular rhythm. Bradycardia present.     Pulses: Normal pulses.     Heart sounds: Normal heart sounds. No murmur. No friction rub. No gallop.   Pulmonary:     Effort: Pulmonary effort is normal. No respiratory distress.     Breath sounds: Normal breath sounds. No wheezing, rhonchi or rales.  Chest:     Chest wall: No tenderness.  Abdominal:     General: Bowel sounds are normal. There is no distension.     Palpations: Abdomen is soft. There is no mass.     Tenderness: There is no abdominal tenderness. There is no right CVA tenderness,  left CVA tenderness, guarding or rebound.  Genitourinary:    Comments: Incontinent  Musculoskeletal: Normal range of motion.        General: No swelling or tenderness.     Right lower leg: No edema.     Left lower leg: No edema.  Skin:    General: Skin is warm and dry.     Coloration: Skin is not pale.     Findings: Rash present. No bruising or erythema.  Neurological:     Mental Status: He is alert. Mental status is at baseline.     Cranial Nerves: No cranial nerve deficit.     Sensory: No sensory deficit.     Motor: No weakness.     Coordination: Coordination normal.     Gait: Gait normal.  Psychiatric:        Mood and Affect: Mood normal.        Behavior: Behavior normal.        Thought Content: Thought content normal.        Cognition and Memory: Memory is impaired.        Judgment: Judgment normal.    Labs reviewed: Recent Labs    08/24/18 0930 11/10/18 1547  NA 144 140  K 4.0 4.3  CL 109 104  CO2 28 31  GLUCOSE 99 86  BUN 14 20  CREATININE 0.75 0.83  CALCIUM 9.6 9.1   Recent Labs    08/24/18 0930 11/10/18 1547  AST 14 12  ALT 5* 5*  BILITOT 0.3 0.2  PROT 6.5 6.3   Recent Labs     08/24/18 0930 11/10/18 1547  WBC 4.2 4.9  NEUTROABS 2,087 2,303  HGB 10.5* 10.6*  HCT 31.9* 31.2*  MCV 87.9 87.2  PLT 251 273   Lab Results  Component Value Date   TSH 1.68 08/24/2018   Lab Results  Component Value Date   HGBA1C (H) 08/08/2008    6.2 (NOTE) The ADA recommends the following therapeutic goal for glycemic control related to Hgb A1c measurement: Goal of therapy: <6.5 Hgb A1c  Reference: American Diabetes Association: Clinical Practice Recommendations 2010, Diabetes Care, 2010, 33: (Suppl  1).   Lab Results  Component Value Date   CHOL 197 08/24/2018   HDL 51 08/24/2018   LDLCALC 128 (H) 08/24/2018   TRIG 79 08/24/2018   CHOLHDL 3.9 08/24/2018    Significant Diagnostic Results in last 30 days:  No results found.  Assessment/Plan 1. Continuous leakage of urine Requires assistance with incontinent care.Discussed with son on skin care after incontinent.may apply protective ointment such as zinc oxide or other OTC ointment to prevent skin breakdown.  - Ambulatory referral to Home Health  2. Mild cognitive impairment Declining memory loss remains high risk for elopement.Now requires assistance with his activity of daily living.Fammily care for patient and wife who has dementia.  - Ambulatory referral to Home Health  3. Prostate cancer metastatic to multiple sites [facility]) Worsening PSA level.No further treatment due to his advance age.MOST form provided son will review with the rest of the siblings then notify provider if desire to fill out form.If symptoms worsen might consider hospice vs palliative care.   - Ambulatory referral to [facility] staff Communication: Reviewed plan of care with patient and son.  Labs/tests ordered: None   Javia Dillow C Mitchell Epling, NP

## 2018-12-16 ENCOUNTER — Telehealth: Payer: Self-pay | Admitting: *Deleted

## 2018-12-16 NOTE — Telephone Encounter (Signed)
Received fax from [place] for daughter, Demietrice Edison Pace, for [employer] for [employer]. Daughter has requested to leave to care for patient due to serious health condition. Needs to be completed and returned to [employer] by 12/30/2018. Needs to be faxed back to Fax: [phone]  Placed in Dinah's folder to review, fill out and sign.

## 2019-01-18 ENCOUNTER — Ambulatory Visit: Payer: Medicare HMO | Admitting: Neurology

## 2019-02-16 ENCOUNTER — Encounter: Payer: Self-pay | Admitting: Neurology

## 2019-02-16 ENCOUNTER — Ambulatory Visit: Payer: Medicare HMO | Admitting: Neurology

## 2019-02-16 ENCOUNTER — Other Ambulatory Visit: Payer: Self-pay

## 2019-02-16 ENCOUNTER — Other Ambulatory Visit (INDEPENDENT_AMBULATORY_CARE_PROVIDER_SITE_OTHER): Payer: Medicare HMO

## 2019-02-16 VITALS — BP 142/70 | HR 66 | Ht 65.0 in | Wt 145.0 lb

## 2019-02-16 DIAGNOSIS — F32A Depression, unspecified: Secondary | ICD-10-CM

## 2019-02-16 DIAGNOSIS — R413 Other amnesia: Secondary | ICD-10-CM

## 2019-02-16 DIAGNOSIS — F329 Major depressive disorder, single episode, unspecified: Secondary | ICD-10-CM | POA: Diagnosis not present

## 2019-02-16 MED ORDER — ESCITALOPRAM OXALATE 10 MG PO TABS: ORAL_TABLET | ORAL | 11 refills | Status: DC

## 2019-02-16 NOTE — Progress Notes (Signed)
NEUROLOGY CONSULTATION NOTE  Troy Ortiz MRN: [id number] DOB: 15-May-1935  Referring provider: Marlowe Sax, NP Primary care provider: Marlowe Sax, NP  Reason for consult:  Memory loss  Thank you for your kind referral of Troy Ortiz for consultation of the above symptoms. Although his history is well known to you, please allow me to reiterate it for the purpose of our medical record. The patient was accompanied to the clinic by his son [name] who also provides collateral information. Records and images were personally reviewed where available.   HISTORY OF PRESENT ILLNESS: This is an 84 year old right-handed man with a history of hypertension, prostate cancer, stroke with no residual deficits, presenting for evaluation of sudden memory change that started last October 2020. His son [name] is present during the visit to provide additional information. His wife was in the hospital on 11/05/2018 and "I guess he snapped." He left the hospital to go back home. Family had to issue a Silver Alert and he was found the next morning by the police in [facility] Bloodwork and urinalysis at that time were unremarkable. Since then, he has not returned to baseline. Prior to the event, he was managing his and his wife's medications. She has dementia as well. He would regularly check her sugar and cook meals. Since October 2020, he would leave the stove on with no food on it. [name] reports "he just shut down completely, this is not my father." He does not want to do anything, sleeping during the day, sitting at home watching TV where in the past he would be very active. For the past couple of months, he needs assistance and prompting with bathing, he refuses to bathe. He has been wearing an adult diaper because he would not completely finish urination and wet himself after. They now have a caregiver staying 12 hours during the day to fix meals and administer medications. He states his memory is "slow." [name]  states mood is not too good. No paranoia or hallucinations.   He denies any headaches, dizziness, diplopia, dysarthria/dysphagia, neck/back pain, focal numbness/tingling/weakness, bowel dysfunction, anosmia, or tremors. [name] reports the patient was complaining of headaches to his daughter within the past 6 months. [name] states he was told his cancer has come back, "at one point it was in his head." They deny any falls but he does veer when walking, there is a history of vertigo. [name] notes occasional staring off and taking a while to respond. [name] is unsure about his sleep but does note the patient's wife wakes up and wanders around, which likely affects his sleep.    PAST MEDICAL HISTORY: Past Medical History:  Diagnosis Date  . Allergic rhinitis, cause unspecified   . Allergy   . Arthritis   . Asthma    AS A CHILD  . Cervicalgia   . Conjunctivitis unspecified   . Dizziness and giddiness   . Enthesopathy of unspecified site   . Generalized hyperhidrosis   . Lumbago   . Neoplasm of uncertain behavior of skin   . Other abnormal glucose   . Other and unspecified hyperlipidemia   . Rash and other nonspecific skin eruption   . Reflux esophagitis   . Stroke [facility])    MINI STROKES  . Unspecified constipation   . Unspecified essential hypertension   . Unspecified late effects of cerebrovascular disease     PAST SURGICAL HISTORY: Past Surgical History:  Procedure Laterality Date  . COLONOSCOPY      MEDICATIONS:  Current Outpatient Medications on File Prior to Visit  Medication Sig Dispense Refill  . amLODipine (NORVASC) 10 MG tablet Take 1 tablet by mouth once daily 90 tablet 1  . aspirin 81 MG tablet Take 1 tablet (81 mg total) by mouth daily. 90 tablet 3  . Darolutamide (NUBEQA) 300 MG TABS Take 300 mg by mouth 2 (two) times daily.    Marland Kitchen ENSURE (ENSURE) Take 237 mLs by mouth 3 (three) times daily between meals.    Marland Kitchen lisinopril (ZESTRIL) 5 MG tablet Take 1 tablet (5 mg total) by  mouth daily. for high blood pressure 90 tablet 1  . loratadine (CLARITIN) 10 MG tablet Take 10 mg by mouth as needed for allergies.    . Meclizine HCl (TRAVEL SICKNESS) 25 MG CHEW CHEW AND SWALLOW 1 TABLET BY MOUTH THREE TIMES DAILY AS NEEDED FOR DIZZINESS 30 tablet 1   No current facility-administered medications on file prior to visit.    ALLERGIES: No Known Allergies  FAMILY HISTORY: Family History  Problem Relation Age of Onset  . Heart disease Mother     SOCIAL HISTORY: Social History   Socioeconomic History  . Marital status: Married    Spouse name: Not on file  . Number of children: 71  . Years of education: Not on file  . Highest education level: Not on file  Occupational History  . Occupation: Glass blower/designer    Comment: retired  . Occupation: Corporate treasurer    Comment: retired  Tobacco Use  . Smoking status: Former Smoker    Years: 30.00  . Smokeless tobacco: Never Used  . Tobacco comment: Quit at age 32  Substance and Sexual Activity  . Alcohol use: No  . Drug use: No  . Sexual activity: Yes    Partners: Female  Other Topics Concern  . Not on file  Social History Narrative   Right handed      Completed HS      [employer]      Lives in one story home with wife   Social Determinants of Health   Financial Resource Strain:   . Difficulty of Paying Living Expenses: Not on file  Food Insecurity:   . Worried About Charity fundraiser in the Last Year: Not on file  . Ran Out of Food in the Last Year: Not on file  Transportation Needs:   . Lack of Transportation (Medical): Not on file  . Lack of Transportation (Non-Medical): Not on file  Physical Activity:   . Days of Exercise per Week: Not on file  . Minutes of Exercise per Session: Not on file  Stress:   . Feeling of Stress : Not on file  Social Connections:   . Frequency of Communication with Friends and Family: Not on file  . Frequency of Social Gatherings with Friends and Family: Not on file  . Attends  Religious Services: Not on file  . Active Member of Clubs or Organizations: Not on file  . Attends Archivist Meetings: Not on file  . Marital Status: Not on file  Intimate Partner Violence:   . Fear of Current or Ex-Partner: Not on file  . Emotionally Abused: Not on file  . Physically Abused: Not on file  . Sexually Abused: Not on file    REVIEW OF SYSTEMS: Constitutional: No fevers, chills, or sweats, no generalized fatigue, change in appetite Eyes: No visual changes, double vision, eye pain Ear, nose and throat: No hearing loss, ear pain, nasal  congestion, sore throat Cardiovascular: No chest pain, palpitations Respiratory:  No shortness of breath at rest or with exertion, wheezes GastrointestinaI: No nausea, vomiting, diarrhea, abdominal pain, fecal incontinence Genitourinary:  No dysuria, urinary retention or frequency Musculoskeletal:  No neck pain, back pain Integumentary: No rash, pruritus, skin lesions Neurological: as above Psychiatric: No depression, insomnia, anxiety Endocrine: No palpitations, fatigue, diaphoresis, mood swings, change in appetite, change in weight, increased thirst Hematologic/Lymphatic:  No anemia, purpura, petechiae. Allergic/Immunologic: no itchy/runny eyes, nasal congestion, recent allergic reactions, rashes  PHYSICAL EXAM: Vitals:   02/16/19 1326  BP: (!) 142/70  Pulse: 66  SpO2: 97%   General: No acute distress Head:  Normocephalic/atraumatic Skin/Extremities: No rash, no edema Neurological Exam: Mental status: alert and oriented to person, place, and year. No dysarthria or aphasia, Fund of knowledge is reduced. Reduced fluency. Recent and remote memory are impaired.  Attention and concentration are reduced.SLUMS score 15/30. [employer] Mental Exam 02/16/2019  Weekday Correct 0  Current year 1  What state are we in? 1  Amount spent 1  Amount left 2  # of Animals 0  5 objects recall 1  Number series 1  Hour markers 2   Time correct 0  Placed X in triangle correctly 1  Largest Figure 1  Name of male 2  Date back to work 0  Type of work 2  State she lived in 0  Total score 15    Cranial nerves: CN I: not tested CN II: pupils equal, round and reactive to light, visual fields intact CN III, IV, VI:  full range of motion, no nystagmus, no ptosis CN V: facial sensation intact CN VII: upper and lower face symmetric CN VIII: hearing intact to conversation CN IX, X: gag intact, uvula midline CN XI: sternocleidomastoid and trapezius muscles intact CN XII: tongue midline Bulk & Tone: normal, no fasciculations. Motor: 5/5 throughout with no pronator drift. Sensation: intact to light touch, cold, pin, vibration and joint position sense.  No extinction to double simultaneous stimulation.  Romberg test negative Deep Tendon Reflexes: +1 throughout, no ankle clonus Plantar responses: downgoing bilaterally Cerebellar: no incoordination on finger to nose testing Gait: slow and cautious, no ataxia Tremor: none  IMPRESSION: This is an 84 year old right-handed man with a history of hypertension, prostate cancer, stroke with no residual deficits, presenting for evaluation of sudden memory change that started last October 2020. Neurological exam non-focal, SLUMS score 15/30. His son reports a rather sudden onset decline since October 2020 while his wife was in the hospital. Since then he has been unable to manage complex tasks. We discussed different causes of memory loss, check B12, TSH, RPR. MRI brain with and without contrast will be ordered to assess for underlying structural abnormality. EEG will be ordered as well. 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 We may uptitrate as tolerated. Continue close supervision. He no longer drives. Follow-up in 6 months, they know to call for any changes.     Thank you for allowing me to participate in the care of this patient. Please do not hesitate to call for any questions or concerns.   Ellouise Newer, M.D.  CC: Marlowe Sax, NP

## 2019-02-16 NOTE — Patient Instructions (Addendum)
1. Bloodwork for B12, RPR, TSH  2. Schedule MRI brain with and without contrast  3. Schedule routine EEG  4. Start Lexapro 10mg  every evening  5. Continue with close supervision  FALL PRECAUTIONS: Be cautious when walking. Scan the area for obstacles that may increase the risk of trips and falls. When getting up in the mornings, sit up at the edge of the bed for a few minutes before getting out of bed. Consider elevating the bed at the head end to avoid drop of blood pressure when getting up. Walk always in a well-lit room (use night lights in the walls). Avoid area rugs or power cords from appliances in the middle of the walkways. Use a walker or a cane if necessary and consider physical therapy for balance exercise. Get your eyesight checked regularly.  FINANCIAL OVERSIGHT: Supervision, especially oversight when making financial decisions or transactions is also recommended.  HOME SAFETY: Consider the safety of the kitchen when operating appliances like stoves, microwave oven, and blender. Consider having supervision and share cooking responsibilities until no longer able to participate in those. Accidents with firearms and other hazards in the house should be identified and addressed as well.  ABILITY TO BE LEFT ALONE: If patient is unable to contact 911 operator, consider using LifeLine, or when the need is there, arrange for someone to stay with patients. Smoking is a fire hazard, consider supervision or cessation. Risk of wandering should be assessed by caregiver and if detected at any point, supervision and safe proof recommendations should be instituted.  MEDICATION SUPERVISION: Inability to self-administer medication needs to be constantly addressed. Implement a mechanism to ensure safe administration of the medications.  RECOMMENDATIONS FOR ALL PATIENTS WITH MEMORY PROBLEMS: 1. Continue to exercise (Recommend 30 minutes of walking everyday, or 3 hours every week) 2. Increase social  interactions - continue going to [place] and enjoy social gatherings with friends and family 3. Eat healthy, avoid fried foods and eat more fruits and vegetables 4. Maintain adequate blood pressure, blood sugar, and blood cholesterol level. Reducing the risk of stroke and cardiovascular disease also helps promoting better memory. 5. Avoid stressful situations. Live a simple life and avoid aggravations. Organize your time and prepare for the next day in anticipation. 6. Sleep well, avoid any interruptions of sleep and avoid any distractions in the bedroom that may interfere with adequate sleep quality 7. Avoid sugar, avoid sweets as there is a strong link between excessive sugar intake, diabetes, and cognitive impairment The Mediterranean diet has been shown to help patients reduce the risk of progressive memory disorders and reduces cardiovascular risk. This includes eating fish, eat fruits and green leafy vegetables, nuts like almonds and hazelnuts, walnuts, and also use olive oil. Avoid fast foods and fried foods as much as possible. Avoid sweets and sugar as sugar use has been linked to worsening of memory function.  There is always a concern of gradual progression of memory problems. If this is the case, then we may need to adjust level of care according to patient needs. Support, both to the patient and caregiver, should then be put into place.

## 2019-02-17 ENCOUNTER — Telehealth: Payer: Self-pay

## 2019-02-17 LAB — VITAMIN B12: Vitamin B-12: 289 pg/mL (ref 200–1100)

## 2019-02-17 LAB — TSH: TSH: 2.4 mIU/L (ref 0.40–4.50)

## 2019-02-17 LAB — RPR: RPR Ser Ql: NONREACTIVE

## 2019-02-17 NOTE — Telephone Encounter (Signed)
-----   Message from Cameron Sprang, MD sent at 02/17/2019 12:20 PM EST ----- Pls let son know that his B12 level is low normal, this would not cause all his symptoms, but can contribute to memory changes. Recommend starting a daily vitamin B12 550mcg tablet, he can get that over the counter. Thanks

## 2019-02-17 NOTE — Telephone Encounter (Signed)
Pt's son informed of B12 results. He was made ware that the low normal result could cause some of the memory changes that the pt is having. Son given the advise of Dr. Delice Lesch to purchase OTC b12 5105mcg and take daily.

## 2019-02-22 ENCOUNTER — Encounter: Payer: Self-pay | Admitting: Family

## 2019-02-22 ENCOUNTER — Other Ambulatory Visit: Payer: Self-pay

## 2019-02-22 ENCOUNTER — Ambulatory Visit (INDEPENDENT_AMBULATORY_CARE_PROVIDER_SITE_OTHER): Payer: Medicare HMO | Admitting: Family

## 2019-02-22 VITALS — BP 120/60 | HR 53 | Temp 97.6°F | Ht 65.0 in | Wt 146.0 lb

## 2019-02-22 DIAGNOSIS — C7951 Secondary malignant neoplasm of bone: Secondary | ICD-10-CM

## 2019-02-22 DIAGNOSIS — C61 Malignant neoplasm of prostate: Secondary | ICD-10-CM

## 2019-02-22 DIAGNOSIS — D630 Anemia in neoplastic disease: Secondary | ICD-10-CM | POA: Diagnosis not present

## 2019-02-22 DIAGNOSIS — E782 Mixed hyperlipidemia: Secondary | ICD-10-CM

## 2019-02-22 DIAGNOSIS — I10 Essential (primary) hypertension: Secondary | ICD-10-CM

## 2019-02-22 NOTE — Progress Notes (Signed)
Provider: Marlowe Sax FNP-C   Marca Gadsby, Nelda Bucks, NP  Patient Care Team: Jaleiyah Alas, Nelda Bucks, NP as PCP - General (Family Medicine) Kathie Rhodes, MD as Consulting Physician (Urology)  Extended Emergency Contact Information Primary Emergency Contact: Evelina Dun Address: [address]          [place], [place] [address] John[place]te L[place] of [place] Phone: [phone] Relation: Spouse Secondary Emergency Contact: king, demietrice Mobile Phone: [phone] Relation: Daughter  Code Status:  Full Code  Goals of care: Advanced Directive information Advanced Directives 08/20/2018  Does Patient Have a Medical Advance Directive? No  Type of Advance Directive -  Does patient want to make changes to medical advance directive? No - Patient declined  Copy of [place] in Chart? -  Would patient like information on creating a medical advance directive? -     Chief Complaint  Patient presents with  . Medical Management of Chronic Issues    6MTH FOLLOW-UP    HPI:  Pt is a 84 y.o. male seen today for medical management of chronic diseases.He is here with the son today.He denies any acute issues this visit.son states patient was seen [facility] by Dr.Aquino on 02/16/2019.He was started on lexapro  10 mg tablet daily and vitamin B12.He was also scheduled for an MRI of the brain.Also has follow up lab work to recheck Vit B12 level,RPR and TSH level.He was advised to follow up in 6 months. Son states patient eating well.Has had one pound weight gain over two months.on Ensure 237 cc three times daily between meals but states drinks at least once daily.  He request refills for Nubeqa 300 mg tablet one by mouth twice daily for Prostate cancer.on chart review medication being prescribed by patient's oncologist.son will contact oncologist for refills.  Hypertension - no home log for brought in to visit.on amlodipine 10 mg tablet daily and lisinopril 5 mg tablet daily.also  on ASA 81 mg tablet daily.he denies any signs of hypotension.    Hyperlipidemia - not on any medication.latest LDL 128,cholesterol 197,TRG 79 (08/24/2018).   Anemia - Hgb 10.6 > 10.5 > 9.0 with normal indices.    Past Medical History:  Diagnosis Date  . Allergic rhinitis, cause unspecified   . Allergy   . Arthritis   . Asthma    AS A CHILD  . Cervicalgia   . Conjunctivitis unspecified   . Dizziness and giddiness   . Enthesopathy of unspecified site   . Generalized hyperhidrosis   . Lumbago   . Neoplasm of uncertain behavior of skin   . Other abnormal glucose   . Other and unspecified hyperlipidemia   . Rash and other nonspecific skin eruption   . Reflux esophagitis   . Stroke [facility])    MINI STROKES  . Unspecified constipation   . Unspecified essential hypertension   . Unspecified late effects of cerebrovascular disease    Past Surgical History:  Procedure Laterality Date  . COLONOSCOPY      No Known Allergies  Allergies as of 02/22/2019   No Known Allergies     Medication List       Accurate as of February 22, 2019 10:01 AM. If you have any questions, ask your nurse or doctor.        amLODipine 10 MG tablet Commonly known as: NORVASC Take 1 tablet by mouth once daily   aspirin 81 MG tablet Take 1 tablet (81 mg total) by mouth daily.   Claritin 10 MG tablet  Generic drug: loratadine Take 10 mg by mouth as needed for allergies.   Ensure Take 237 mLs by mouth 3 (three) times daily between meals.   escitalopram 10 MG tablet Commonly known as: Lexapro Take 1 tablet every evening   lisinopril 5 MG tablet Commonly known as: ZESTRIL Take 1 tablet (5 mg total) by mouth daily. for high blood pressure   Meclizine HCl 25 MG Chew Commonly known as: Technical sales engineer AND SWALLOW 1 TABLET BY MOUTH THREE TIMES DAILY AS NEEDED FOR DIZZINESS   Nubeqa 300 MG tablet Generic drug: darolutamide Take 300 mg by mouth 2 (two) times daily.       Review of Systems   Constitutional: Negative for appetite change, chills, fatigue and fever.  HENT: Negative for congestion, rhinorrhea, sinus pressure, sinus pain, sneezing, sore throat and trouble swallowing.   Eyes: Positive for visual disturbance. Negative for discharge, redness and itching.       Wears eye glasses follows up with Ophthalmology   Respiratory: Negative for cough, chest tightness, shortness of breath and wheezing.   Cardiovascular: Negative for chest pain, palpitations and leg swelling.  Gastrointestinal: Negative for abdominal distention, abdominal pain, constipation, diarrhea, nausea and vomiting.  Endocrine: Negative for cold intolerance, heat intolerance, polydipsia, polyphagia and polyuria.  Genitourinary: Negative for difficulty urinating, dysuria, flank pain and urgency.       Hx of prostate cancer   Musculoskeletal: Negative for arthralgias and gait problem.  Skin: Negative for color change, pallor and rash.  Neurological: Negative for dizziness, speech difficulty, weakness, light-headedness, numbness and headaches.  Hematological: Does not bruise/bleed easily.  Psychiatric/Behavioral: Negative for agitation and sleep disturbance. The patient is not nervous/anxious.     Immunization History  Administered Date(s) Administered  . Fluad Quad(high Dose 65+) 11/10/2018  . Influenza Split 12/20/2009, 10/25/2011  . Influenza, High Dose Seasonal PF 09/30/2016  . Influenza,inj,Quad PF,6+ Mos 01/24/2013, 12/23/2013  . Influenza-Unspecified 12/20/2014, 11/28/2015  . Pneumococcal Conjugate-13 08/07/2014  . Pneumococcal Polysaccharide-23 11/28/2015   Pertinent  Health Maintenance Due  Topic Date Due  . INFLUENZA VACCINE  Completed  . PNA vac Low Risk Adult  Completed  . COLONOSCOPY  Discontinued   Fall Risk  02/22/2019 02/16/2019 12/08/2018 11/10/2018 08/20/2018  Falls in the past year? 0 0 0 0 0  Number falls in past yr: 0 0 0 0 0  Injury with Fall? 0 0 0 0 0  Risk for fall due to : - -  - - -  Follow up - Falls evaluation completed - - -    Vitals:   02/22/19 0944  BP: 120/60  Pulse: (!) 53  Temp: 97.6 F (36.4 C)  TempSrc: Oral  SpO2: 96%  Weight: 146 lb (66.2 kg)  Height: '5\' 5"'$  (1.651 m)   Body mass index is 24.3 kg/m. Physical Exam Vitals reviewed.  Constitutional:      General: He is not in acute distress.    Appearance: He is normal weight. He is not ill-appearing.  HENT:     Head: Normocephalic.     Right Ear: Tympanic membrane, ear canal and external ear normal. There is no impacted cerumen.     Left Ear: Tympanic membrane and external ear normal. There is no impacted cerumen.     Nose: Nose normal. No congestion or rhinorrhea.     Mouth/Throat:     Mouth: Mucous membranes are moist.     Pharynx: Oropharynx is clear. No oropharyngeal exudate or posterior oropharyngeal erythema.  Eyes:  General: No scleral icterus.       Right eye: No discharge.        Left eye: No discharge.     Extraocular Movements: Extraocular movements intact.     Conjunctiva/sclera: Conjunctivae normal.     Pupils: Pupils are equal, round, and reactive to light.  Neck:     Vascular: No carotid bruit.  Cardiovascular:     Rate and Rhythm: Normal rate and regular rhythm.     Pulses: Normal pulses.     Heart sounds: Normal heart sounds. No murmur. No friction rub. No gallop.   Pulmonary:     Effort: Pulmonary effort is normal. No respiratory distress.     Breath sounds: Normal breath sounds. No wheezing, rhonchi or rales.  Chest:     Chest wall: No tenderness.  Abdominal:     General: Bowel sounds are normal. There is no distension.     Palpations: Abdomen is soft. There is no mass.     Tenderness: There is no abdominal tenderness. There is no right CVA tenderness, left CVA tenderness, guarding or rebound.  Musculoskeletal:        General: No swelling or tenderness. Normal range of motion.     Cervical back: Normal range of motion. No rigidity or tenderness.      Right lower leg: No edema.     Left lower leg: No edema.  Lymphadenopathy:     Cervical: No cervical adenopathy.  Skin:    General: Skin is warm and dry.     Coloration: Skin is not pale.     Findings: No bruising, erythema or rash.  Neurological:     Mental Status: He is alert. Mental status is at baseline.     Cranial Nerves: No cranial nerve deficit.     Sensory: No sensory deficit.     Motor: No weakness.     Coordination: Coordination normal.     Gait: Gait normal.  Psychiatric:        Mood and Affect: Mood normal.        Behavior: Behavior normal.        Thought Content: Thought content normal.        Judgment: Judgment normal.    Labs reviewed: Recent Labs    08/24/18 0930 11/10/18 1547  NA 144 140  K 4.0 4.3  CL 109 104  CO2 28 31  GLUCOSE 99 86  BUN 14 20  CREATININE 0.75 0.83  CALCIUM 9.6 9.1   Recent Labs    08/24/18 0930 11/10/18 1547  AST 14 12  ALT 5* 5*  BILITOT 0.3 0.2  PROT 6.5 6.3   Recent Labs    08/24/18 0930 11/10/18 1547  WBC 4.2 4.9  NEUTROABS 2,087 2,303  HGB 10.5* 10.6*  HCT 31.9* 31.2*  MCV 87.9 87.2  PLT 251 273   Lab Results  Component Value Date   TSH 2.40 02/16/2019   Lab Results  Component Value Date   HGBA1C (H) 08/08/2008    6.2 (NOTE) The ADA recommends the following therapeutic goal for glycemic control related to Hgb A1c measurement: Goal of therapy: <6.5 Hgb A1c  Reference: American Diabetes Association: Clinical Practice Recommendations 2010, Diabetes Care, 2010, 33: (Suppl  1).   Lab Results  Component Value Date   CHOL 197 08/24/2018   HDL 51 08/24/2018   LDLCALC 128 (H) 08/24/2018   TRIG 79 08/24/2018   CHOLHDL 3.9 08/24/2018    Significant Diagnostic Results in last 30  days:  No results found.  Assessment/Plan 1. Essential hypertension, benign B/p at goal this visit.continue on amlodipine 10 mg tablet daily and lisinopril 5 mg tablet daily.continue on ASA 81 mg tablet daily. - CBC with  Differential/Platelet; Future - CMP with eGFR([facility]); Future  2. Mixed hyperlipidemia LDL 128.not on medication.encouraged heart healthy diet and exercise as tolerated.  - Lipid panel; Future  3. Prostate cancer metastatic to bone [facility]) Metastatic to bone reported no further treatment with chemo or radiation.Continue on Nubeqa 300 mg tablet one by mouth twice daily.continue to follow up with Oncologist for medication management.  4. Anemia in neoplastic disease Hgb stable.encouraged well balanced diet.  - CBC with Differential/Platelet; Future  Family/ staff Communication: Reviewed plan of care with patient  Labs/tests ordered:    - CBC with Differential/Platelet; Future - CMP with eGFR([facility]); Future - Lipid panel; Future  Next Appointment : 6 months for medical management of chronic issues.Fasting Labs 2-4 days prior to visit.   Sandrea Hughs, NP

## 2019-03-23 ENCOUNTER — Other Ambulatory Visit: Payer: Medicare HMO

## 2019-03-27 ENCOUNTER — Ambulatory Visit: Payer: Medicare HMO | Attending: Internal Medicine

## 2019-03-27 DIAGNOSIS — Z23 Encounter for immunization: Secondary | ICD-10-CM

## 2019-03-27 NOTE — Progress Notes (Signed)
   Covid-19 Vaccination Clinic  Name:  Troy Ortiz    MRN: [id number] DOB: January 04, 1936  03/27/2019  [name] was observed post Covid-19 immunization for 15 minutes without incidence. He was provided with Vaccine Information Sheet and instruction to access the V-Safe system.   [name] was instructed to call 911 with any severe reactions post vaccine: Marland Kitchen Difficulty breathing  . Swelling of your face and throat  . A fast heartbeat  . A bad rash all over your body  . Dizziness and weakness    Immunizations Administered    Name Date Dose VIS Date Route   Pfizer COVID-19 Vaccine 03/27/2019 11:35 AM 0.3 mL 01/14/2019 Intramuscular   Manufacturer: [place]   Lot: [id number]   [place]: [id number]

## 2019-03-30 ENCOUNTER — Other Ambulatory Visit: Payer: Self-pay

## 2019-03-30 ENCOUNTER — Ambulatory Visit (INDEPENDENT_AMBULATORY_CARE_PROVIDER_SITE_OTHER): Payer: Medicare HMO | Admitting: Neurology

## 2019-03-30 DIAGNOSIS — R413 Other amnesia: Secondary | ICD-10-CM | POA: Diagnosis not present

## 2019-04-01 NOTE — Procedures (Signed)
ELECTROENCEPHALOGRAM REPORT  Date of Study: 03/30/2019  Patient's Name: Troy Ortiz MRN: [id number] Date of Birth: [birth year]  Referring Provider: Dr. Ellouise Newer  Clinical History: This is an 84 year old man with sudden memory change in October 2020, with sudden onset decline since then.  Medications: NORVASC 10 MG tablet aspirin 81 MG tablet NUBEQA 300 MG TABS ENSURE ZESTRIL 5 MG tablet CLARITIN 10 MG tablet Meclizine HCl (TRAVEL SICKNESS) 25 MG   Technical Summary: A multichannel digital EEG recording measured by the international 10-20 system with electrodes applied with paste and impedances below 5000 ohms performed in our laboratory with EKG monitoring in an awake and asleep patient.  Hyperventilation was not performed. Photic stimulation was performed.  The digital EEG was referentially recorded, reformatted, and digitally filtered in a variety of bipolar and referential montages for optimal display.    Description: The patient is awake and asleep during the recording.  During maximal wakefulness, there is a symmetric, medium voltage 8 Hz posterior dominant rhythm that attenuates with eye opening. There is occasional focal theta slowing over the left temporal region. During drowsiness and stage I sleep, there is an increase in theta slowing of the background with occasional vertex waves seen. Photic stimulation did not elicit any abnormalities.  There were no epileptiform discharges or electrographic seizures seen.    EKG lead was unremarkable.  Impression: This awake and asleep EEG is abnormal due to occasional focal slowing over the left temporal region.  Clinical Correlation of the above findings indicates focal cerebral dysfunction over the left temporal region suggestive of underlying structural or physiologic abnormality. The absence of epileptiform discharges does not exclude a clinical diagnosis of epilepsy. Clinical correlation is advised.   Ellouise Newer, M.D.

## 2019-04-13 ENCOUNTER — Telehealth: Payer: Self-pay

## 2019-04-13 NOTE — Telephone Encounter (Signed)
Spoke with pt daughter EEG did not show any seizure discharges, it showed slowing of the brain waves, which is non-specific, and can be age-related, they will call [facility] to get MRI scheduled

## 2019-04-20 ENCOUNTER — Ambulatory Visit: Payer: Medicare HMO | Attending: Internal Medicine

## 2019-04-20 DIAGNOSIS — Z23 Encounter for immunization: Secondary | ICD-10-CM

## 2019-04-20 NOTE — Progress Notes (Signed)
   Covid-19 Vaccination Clinic  Name:  Troy Ortiz    MRN: [id number] DOB: 1935-09-08  04/20/2019  [name] was observed post Covid-19 immunization for 15 minutes without incident. He was provided with Vaccine Information Sheet and instruction to access the V-Safe system.   [name] was instructed to call 911 with any severe reactions post vaccine: Marland Kitchen Difficulty breathing  . Swelling of face and throat  . A fast heartbeat  . A bad rash all over body  . Dizziness and weakness   Immunizations Administered    Name Date Dose VIS Date Route   Pfizer COVID-19 Vaccine 04/20/2019  1:54 PM 0.3 mL 01/14/2019 Intramuscular   Manufacturer: [place]   Lot: [id number]   [place]: [id number]

## 2019-05-05 ENCOUNTER — Other Ambulatory Visit: Payer: Self-pay | Admitting: Family

## 2019-05-05 DIAGNOSIS — I10 Essential (primary) hypertension: Secondary | ICD-10-CM

## 2019-05-07 ENCOUNTER — Other Ambulatory Visit: Payer: Self-pay

## 2019-05-07 ENCOUNTER — Ambulatory Visit
Admission: RE | Admit: 2019-05-07 | Discharge: 2019-05-07 | Disposition: A | Discharge status: Home / Self Care | Payer: Medicare HMO | Source: Ambulatory Visit | Attending: Neurology | Admitting: Neurology

## 2019-05-07 DIAGNOSIS — R413 Other amnesia: Secondary | ICD-10-CM

## 2019-05-07 MED ORDER — GADOBENATE DIMEGLUMINE 529 MG/ML IV SOLN
13.0000 mL | Freq: Once | INTRAVENOUS | Status: AC | PRN
  Administered 2019-05-07: 13 mL via INTRAVENOUS

## 2019-05-08 ENCOUNTER — Other Ambulatory Visit: Payer: Self-pay

## 2019-05-08 ENCOUNTER — Emergency Department (HOSPITAL_COMMUNITY): Payer: Medicare HMO

## 2019-05-08 ENCOUNTER — Encounter (HOSPITAL_COMMUNITY): Payer: Self-pay | Admitting: Emergency Medicine

## 2019-05-08 ENCOUNTER — Emergency Department (HOSPITAL_COMMUNITY)
Admission: EM | Admit: 2019-05-08 | Discharge: 2019-05-08 | Disposition: A | Discharge status: Home / Self Care | Payer: Medicare HMO | Attending: Emergency Medicine | Admitting: Emergency Medicine

## 2019-05-08 DIAGNOSIS — Y999 Unspecified external cause status: Secondary | ICD-10-CM | POA: Insufficient documentation

## 2019-05-08 DIAGNOSIS — S0990XA Unspecified injury of head, initial encounter: Secondary | ICD-10-CM | POA: Diagnosis present

## 2019-05-08 DIAGNOSIS — Z7982 Long term (current) use of aspirin: Secondary | ICD-10-CM | POA: Insufficient documentation

## 2019-05-08 DIAGNOSIS — R42 Dizziness and giddiness: Secondary | ICD-10-CM | POA: Diagnosis not present

## 2019-05-08 DIAGNOSIS — Z23 Encounter for immunization: Secondary | ICD-10-CM | POA: Insufficient documentation

## 2019-05-08 DIAGNOSIS — Z87891 Personal history of nicotine dependence: Secondary | ICD-10-CM | POA: Insufficient documentation

## 2019-05-08 DIAGNOSIS — Z79899 Other long term (current) drug therapy: Secondary | ICD-10-CM | POA: Diagnosis not present

## 2019-05-08 DIAGNOSIS — S0083XA Contusion of other part of head, initial encounter: Secondary | ICD-10-CM | POA: Diagnosis not present

## 2019-05-08 DIAGNOSIS — I10 Essential (primary) hypertension: Secondary | ICD-10-CM | POA: Insufficient documentation

## 2019-05-08 DIAGNOSIS — Y9389 Activity, other specified: Secondary | ICD-10-CM | POA: Insufficient documentation

## 2019-05-08 DIAGNOSIS — W19XXXA Unspecified fall, initial encounter: Secondary | ICD-10-CM | POA: Insufficient documentation

## 2019-05-08 DIAGNOSIS — S022XXA Fracture of nasal bones, initial encounter for closed fracture: Secondary | ICD-10-CM | POA: Diagnosis not present

## 2019-05-08 DIAGNOSIS — S61011A Laceration without foreign body of right thumb without damage to nail, initial encounter: Secondary | ICD-10-CM | POA: Diagnosis not present

## 2019-05-08 DIAGNOSIS — Y92008 Other place in unspecified non-institutional (private) residence as the place of occurrence of the external cause: Secondary | ICD-10-CM | POA: Insufficient documentation

## 2019-05-08 LAB — COMPREHENSIVE METABOLIC PANEL
ALT: 45 U/L — ABNORMAL HIGH (ref 0–44)
AST: 36 U/L (ref 15–41)
Albumin: 3.8 g/dL (ref 3.5–5.0)
Alkaline Phosphatase: 825 U/L — ABNORMAL HIGH (ref 38–126)
Anion gap: 9 (ref 5–15)
BUN: 17 mg/dL (ref 8–23)
CO2: 24 mmol/L (ref 22–32)
Calcium: 8.9 mg/dL (ref 8.9–10.3)
Chloride: 107 mmol/L (ref 98–111)
Creatinine, Ser: 0.57 mg/dL — ABNORMAL LOW (ref 0.61–1.24)
GFR calc Af Amer: >60 mL/min (ref >60)
GFR calc non Af Amer: >60 mL/min (ref >60)
Glucose, Bld: 130 mg/dL — ABNORMAL HIGH (ref 70–99)
Potassium: 3.6 mmol/L (ref 3.5–5.1)
Sodium: 140 mmol/L (ref 135–145)
Total Bilirubin: 0.5 mg/dL (ref 0.3–1.2)
Total Protein: 7 g/dL (ref 6.5–8.1)

## 2019-05-08 LAB — URINALYSIS, ROUTINE W REFLEX MICROSCOPIC
Bilirubin Urine: NEGATIVE
Glucose, UA: NEGATIVE mg/dL
Hgb urine dipstick: NEGATIVE
Ketones, ur: NEGATIVE mg/dL
Leukocytes,Ua: NEGATIVE
Nitrite: NEGATIVE
Protein, ur: 30 mg/dL — AB
Specific Gravity, Urine: 1.021 (ref 1.005–1.030)
pH: 5 (ref 5.0–8.0)

## 2019-05-08 LAB — CBC WITH DIFFERENTIAL/PLATELET
Abs Immature Granulocytes: 0.03 10*3/uL (ref 0.00–0.07)
Basophils Absolute: 0 10*3/uL (ref 0.0–0.1)
Basophils Relative: 1 %
Eosinophils Absolute: 0.2 10*3/uL (ref 0.0–0.5)
Eosinophils Relative: 3 %
HCT: 32.8 % — ABNORMAL LOW (ref 39.0–52.0)
Hemoglobin: 10.6 g/dL — ABNORMAL LOW (ref 13.0–17.0)
Immature Granulocytes: 1 %
Lymphocytes Relative: 29 %
Lymphs Abs: 1.6 10*3/uL (ref 0.7–4.0)
MCH: 29.3 pg (ref 26.0–34.0)
MCHC: 32.3 g/dL (ref 30.0–36.0)
MCV: 90.6 fL (ref 80.0–100.0)
Monocytes Absolute: 0.4 10*3/uL (ref 0.1–1.0)
Monocytes Relative: 7 %
Neutro Abs: 3.4 10*3/uL (ref 1.7–7.7)
Neutrophils Relative %: 59 %
Platelets: 206 10*3/uL (ref 150–400)
RBC: 3.62 MIL/uL — ABNORMAL LOW (ref 4.22–5.81)
RDW: 13.7 % (ref 11.5–15.5)
WBC: 5.6 10*3/uL (ref 4.0–10.5)
nRBC: 0 % (ref 0.0–0.2)

## 2019-05-08 LAB — TROPONIN I (HIGH SENSITIVITY): Troponin I (High Sensitivity): 6 ng/L (ref <18)

## 2019-05-08 MED ORDER — LIDOCAINE HCL (PF) 1 % IJ SOLN
5.0000 mL | Freq: Once | INTRAMUSCULAR | Status: AC
  Administered 2019-05-08: 10:00:00 5 mL
  Filled 2019-05-08: qty 30

## 2019-05-08 MED ORDER — MECLIZINE HCL 25 MG PO TABS
25.0000 mg | ORAL_TABLET | Freq: Once | ORAL | Status: AC
  Administered 2019-05-08: 12:00:00 25 mg via ORAL
  Filled 2019-05-08: qty 1

## 2019-05-08 MED ORDER — TETANUS-DIPHTH-ACELL PERTUSSIS 5-2.5-18.5 LF-MCG/0.5 IM SUSP
0.5000 mL | Freq: Once | INTRAMUSCULAR | Status: AC
  Administered 2019-05-08: 10:00:00 0.5 mL via INTRAMUSCULAR
  Filled 2019-05-08: qty 0.5

## 2019-05-08 NOTE — ED Notes (Signed)
Urine culture sent down to lab with urinalysis. 

## 2019-05-08 NOTE — ED Notes (Signed)
Discharge paperwork reviewed with pt and pts family member.  Both verbalized understanding, no questions or concerns at this time.  Pt assisted to wheelchair, wheeled to ED entrance.

## 2019-05-08 NOTE — ED Notes (Signed)
ED Provider at bedside. 

## 2019-05-08 NOTE — ED Notes (Signed)
Family at bedside. 

## 2019-05-08 NOTE — ED Notes (Signed)
Patient is aware that urine sample is needed. Urinal at bedside. 

## 2019-05-08 NOTE — ED Notes (Signed)
Pt provided with turkey sandwich and water

## 2019-05-08 NOTE — ED Triage Notes (Signed)
Patient brought in by daughter reporting trip and fall this morning. Abrasion noted to right thumb. Patient also reports hitting face near nose. Denies blood thinners. No LOC.

## 2019-05-08 NOTE — ED Provider Notes (Signed)
[facility] DEPT Provider Note   CSN: [id number] Arrival date & time: 05/08/19  8466     History Chief Complaint  Patient presents with  . Fall  . Finger Injury  . Head Injury    Troy Ortiz is a 84 y.o. male with PMHx HTN, hx of stroke currently not on antiplatelet or anticoag therapy, who presents to the ED today from home s/p mechanical fall.  Per patient he was walking around in the living room today when he believes he tripped over his feet causing him to fall.  He hit his head on the ground however no loss of consciousness.  Patient is not anticoagulated.  He has a hematoma noted to his forehead and pain to the bridge of his nose.  He states his teeth feel like they are in place.  Patient is complaining specifically of pain to his right thumb.  He does have a laceration to the DIP joint; status unknown.  Patient and daughter are unsure what he cut his thumb on.  Denies any chest pain, shortness of breath, dizziness, lightheadedness or other precipitating events prior to the fall.   The history is provided by the patient and a relative.       Past Medical History:  Diagnosis Date  . Allergic rhinitis, cause unspecified   . Allergy   . Arthritis   . Asthma    AS A CHILD  . Cervicalgia   . Conjunctivitis unspecified   . Dizziness and giddiness   . Enthesopathy of unspecified site   . Generalized hyperhidrosis   . Lumbago   . Neoplasm of uncertain behavior of skin   . Other abnormal glucose   . Other and unspecified hyperlipidemia   . Rash and other nonspecific skin eruption   . Reflux esophagitis   . Stroke [facility])    MINI STROKES  . Unspecified constipation   . Unspecified essential hypertension   . Unspecified late effects of cerebrovascular disease     Patient Active Problem List   Diagnosis Date Noted  . Pulmonary nodules 05/01/2017  . Prostate cancer metastatic to multiple sites ([place]) 05/01/2017  . High risk medication use  05/01/2017  . Cerebrovascular disease, unspecified 05/01/2017  . Slow heart rate 08/05/2016  . Pain, joint, multiple sites 02/22/2016  . Elevated PSA 02/22/2016  . Chronic allergic rhinitis due to pollen 02/22/2016  . Neck pain on left side 02/22/2016  . Essential hypertension, benign 03/21/2014  . BPV (benign positional vertigo) 03/21/2014  . Non compliance w medication regimen 03/21/2014  . Benign paroxysmal positional vertigo 12/13/2013  . Slow transit constipation 12/13/2013  . Routine general medical examination at a health care facility 05/11/2013  . Mixed hyperlipidemia 02/08/2013  . Dizziness 02/08/2013  . Anemia 02/08/2013  . Back pain, lumbosacral 02/08/2013  . HTN (hypertension) 01/10/2011    Past Surgical History:  Procedure Laterality Date  . COLONOSCOPY         Family History  Problem Relation Age of Onset  . Heart disease Mother     Social History   Tobacco Use  . Smoking status: Former Smoker    Years: 30.00  . Smokeless tobacco: Never Used  . Tobacco comment: Quit at age 63  Substance Use Topics  . Alcohol use: No  . Drug use: No    Home Medications Prior to Admission medications   Medication Sig Start Date End Date Taking? Authorizing Provider  amLODipine (NORVASC) 10 MG tablet Take 1 tablet by  mouth once daily Patient taking differently: Take 10 mg by mouth daily. Take 1 tablet by mouth once daily 11/30/18  Yes Ngetich, Dinah C, NP  Cyanocobalamin (B-12 PO) Take 1 tablet by mouth daily.   Yes [provider]  Darolutamide (NUBEQA) 300 MG TABS Take 300 mg by mouth 2 (two) times daily.   Yes [provider]  ENSURE (ENSURE) Take 237 mLs by mouth 3 (three) times daily between meals.   Yes [provider]  escitalopram (LEXAPRO) 10 MG tablet Take 1 tablet every evening Patient taking differently: Take 10 mg by mouth daily. Take 1 tablet every evening 02/16/19  Yes Cameron Sprang, MD  lisinopril (ZESTRIL) 5 MG tablet Take  1 tablet (5 mg total) by mouth daily. for high blood pressure 11/30/18  Yes Ngetich, Dinah C, NP  loratadine (CLARITIN) 10 MG tablet Take 10 mg by mouth as needed for allergies.   Yes [provider]  Meclizine HCl (TRAVEL SICKNESS) 25 MG CHEW CHEW AND SWALLOW 1 TABLET BY MOUTH THREE TIMES DAILY AS NEEDED FOR DIZZINESS Patient taking differently: Chew 25 mg by mouth 3 (three) times daily as needed (dizziness). CHEW AND SWALLOW 1 TABLET BY MOUTH THREE TIMES DAILY AS NEEDED FOR DIZZINESS 08/20/18  Yes Ngetich, Dinah C, NP  aspirin 81 MG tablet Take 1 tablet (81 mg total) by mouth daily. Patient not taking: Reported on 05/08/2019 01/16/16   Gildardo Cranker, DO    Allergies    Patient has no known allergies.  Review of Systems   Review of Systems  Constitutional: Negative for chills and fever.  Musculoskeletal: Positive for arthralgias.  Skin: Positive for wound.  Neurological: Positive for headaches. Negative for syncope.  All other systems reviewed and are negative.   Physical Exam Updated Vital Signs BP (!) 181/65 (BP Location: Left Arm)   Pulse (!) 56   Temp 98 F (36.7 C) (Oral)   Resp 18   Ht '5\' 6"'$  (1.676 m)   SpO2 100%   BMI 23.57 kg/m   Physical Exam Vitals and nursing note reviewed.  Constitutional:      Appearance: He is not ill-appearing or diaphoretic.  HENT:     Head: Normocephalic.     Comments: Hematoma noted to left frontal forehead above eyebrow with TTP. Bruising noted to bridge of nose as well with TTP. No raccoon's sign or battle's sign. Negative hemotympanum bilaterally.     Right Ear: Tympanic membrane normal.     Left Ear: Tympanic membrane normal.  Eyes:     Conjunctiva/sclera: Conjunctivae normal.  Cardiovascular:     Rate and Rhythm: Normal rate and regular rhythm.     Pulses: Normal pulses.  Pulmonary:     Effort: Pulmonary effort is normal.     Breath sounds: Normal breath sounds. No wheezing, rhonchi or rales.  Abdominal:     Palpations:  Abdomen is soft.     Tenderness: There is no abdominal tenderness. There is no guarding or rebound.  Musculoskeletal:     Cervical back: Normal range of motion and neck supple. No tenderness.     Comments: 2.5 cm laceration to palmar aspect of R 1st digit along DIP joint; bleeding controlled; ROM intact. 2+ radial pulse.   Skin:    General: Skin is warm and dry.  Neurological:     Mental Status: He is alert.     Comments: CN 3-12 grossly intact A&O x4 GCS 15 Sensation and strength intact Coordination with finger-to-nose WNL Neg  romberg, neg pronator drift     ED Results / Procedures / Treatments   Labs (all labs ordered are listed, but only abnormal results are displayed) Labs Reviewed  COMPREHENSIVE METABOLIC PANEL - Abnormal; Notable for the following components:      Result Value   Glucose, Bld 130 (*)    Creatinine, Ser 0.57 (*)    ALT 45 (*)    Alkaline Phosphatase 825 (*)    All other components within normal limits  CBC WITH DIFFERENTIAL/PLATELET - Abnormal; Notable for the following components:   RBC 3.62 (*)    Hemoglobin 10.6 (*)    HCT 32.8 (*)    All other components within normal limits  URINALYSIS, ROUTINE W REFLEX MICROSCOPIC - Abnormal; Notable for the following components:   APPearance CLOUDY (*)    Protein, ur 30 (*)    Bacteria, UA RARE (*)    Crystals PRESENT (*)    All other components within normal limits  TROPONIN I (HIGH SENSITIVITY)    EKG EKG Interpretation  Date/Time:  Sunday May 08 2019 11:44:05 EDT Ventricular Rate:  46 PR Interval:    QRS Duration: 115 QT Interval:  556 QTC Calculation: 487 R Axis:   69 Text Interpretation: Sinus bradycardia Nonspecific intraventricular conduction delay No significant change since last tracing Confirmed by Blanchie Dessert ([id number]) on 05/08/2019 12:17:03 PM   Radiology CT Head Wo Contrast  Result Date: 05/08/2019 CLINICAL DATA:  Facial and head trauma. Headache. EXAM: CT HEAD WITHOUT CONTRAST  TECHNIQUE: Contiguous axial images were obtained from the base of the skull through the vertex without intravenous contrast. COMPARISON:  Brain MRI May 07, 2019 FINDINGS: Brain: No evidence of acute infarction, hemorrhage, hydrocephalus, extra-axial collection or mass lesion/mass effect. Advanced chronic microvascular disease. Stable appearance of chronic infarcts of the basal ganglia, thalamus and left cerebellum. Vascular: No hyperdense vessel or unexpected calcification. Skull: Normal. Negative for fracture or focal lesion. Sinuses/Orbits: No acute finding. Other: Left frontal scalp hematoma. IMPRESSION: 1. No acute intracranial abnormality. 2. Advanced brain parenchymal atrophy, chronic microvascular disease. 3. Stable appearance of chronic infarcts of the basal ganglia, thalamus and left cerebellum. Electronically Signed   By: Fidela Salisbury M.D.   On: 05/08/2019 11:35   CT Cervical Spine Wo Contrast  Result Date: 05/08/2019 CLINICAL DATA:  Trauma. Posterior neck pain. EXAM: CT CERVICAL SPINE WITHOUT CONTRAST TECHNIQUE: Multidetector CT imaging of the cervical spine was performed without intravenous contrast. Multiplanar CT image reconstructions were also generated. COMPARISON:  None. FINDINGS: Alignment: Straightening of the cervical lordosis. Skull base and vertebrae: No acute fracture. No primary bone lesion or focal pathologic process. Soft tissues and spinal canal: No prevertebral fluid or swelling. No visible canal hematoma. Disc levels: Multilevel osteoarthritic changes with extensive disc space narrowing, degenerative remodeling of the vertebral bodies. Posterior facet arthropathy. Ankylosing changes at C4-C5, degenerative in etiology. Upper chest: Negative. Other: None. IMPRESSION: 1. No evidence of acute traumatic injury to the cervical spine. 2. Multilevel osteoarthritic changes of the cervical spine. Electronically Signed   By: Fidela Salisbury M.D.   On: 05/08/2019 11:44   MR BRAIN W  WO CONTRAST  Result Date: 05/07/2019 CLINICAL DATA:  Memory loss EXAM: MRI HEAD WITHOUT AND WITH CONTRAST TECHNIQUE: Multiplanar, multiecho pulse sequences of the brain and surrounding structures were obtained without and with intravenous contrast. CONTRAST:  86m MULTIHANCE GADOBENATE DIMEGLUMINE 529 MG/ML IV SOLN COMPARISON:  2017 FINDINGS: Brain: There is no acute infarction or intracranial hemorrhage. There is no intracranial mass,  mass effect, or edema. There is no hydrocephalus or extra-axial fluid collection. Prominence of the ventricles and sulci reflects generalized parenchymal volume loss of which has slightly increased. Confluent areas of T2 hyperintensity in the supratentorial much greater than pontine white matter are nonspecific but probably reflect advanced chronic microvascular ischemic changes. There are chronic infarcts of the central white matter, basal ganglia and thalamus, and left cerebellum. New small chronic right occipital infarct. New focus of susceptibility within the anterior left thalamus likely reflects chronic microhemorrhage. No abnormal enhancement. Vascular: Major vessel flow voids at the skull base are preserved. Skull and upper cervical spine: Normal marrow signal is preserved. Sinuses/Orbits: Paranasal sinuses are aerated. Orbits are unremarkable. Other: Sella is unremarkable.  Mastoid air cells are clear. IMPRESSION: No acute abnormality. Advanced chronic microvascular ischemic changes and chronic infarcts with some progression since 2017. Increased parenchymal volume loss since 2017. Electronically Signed   By: Macy Mis M.D.   On: 05/07/2019 21:23   DG Finger Thumb Right  Result Date: 05/08/2019 CLINICAL DATA:  Laceration following fall EXAM: RIGHT THUMB 2+V COMPARISON:  None. FINDINGS: Frontal, oblique, and lateral views obtained. There is an overlying bandage. No other radiopaque foreign body seen. No evidence soft tissue air. No demonstrable fracture or  dislocation. There is severe osteoarthritic change in the first carpal-metacarpal joint. There is moderate osteoarthritic change in the scaphotrapezial joint. There is mild osteoarthritic change in the first IP joint. No erosive change. IMPRESSION: No fracture or dislocation. Osteoarthritic change, most marked in the first carpal-metacarpal joint. No radiopaque foreign body beyond overlying bandage. Electronically Signed   By: Lowella Grip III M.D.   On: 05/08/2019 10:29   CT Maxillofacial WO CM  Result Date: 05/08/2019 CLINICAL DATA:  Facial and head trauma. EXAM: CT MAXILLOFACIAL WITHOUT CONTRAST TECHNIQUE: Multidetector CT imaging of the maxillofacial structures was performed. Multiplanar CT image reconstructions were also generated. COMPARISON:  None. FINDINGS: Osseous: Minimally displaced distal left nasal bone fracture. Otherwise no facial fractures identified. Orbits: Negative. No traumatic or inflammatory finding. Sinuses: Clear. Soft tissues: Soft tissue swelling of the left frontal scalp and over the nose. Limited intracranial: No significant or unexpected finding. IMPRESSION: 1. Minimally displaced distal left nasal bone fracture. 2. Soft tissue swelling of the left frontal scalp and over the nose. Electronically Signed   By: Fidela Salisbury M.D.   On: 05/08/2019 11:40    Procedures .Marland KitchenLaceration Repair  Date/Time: 05/08/2019 12:12 PM Performed by: Eustaquio Maize, PA-C Authorized by: Eustaquio Maize, PA-C   Consent:    Consent obtained:  Verbal   Consent given by:  Patient   Risks discussed:  Infection, pain and poor cosmetic result Anesthesia (see MAR for exact dosages):    Anesthesia method:  Nerve block   Block needle gauge:  25 G   Block anesthetic:  Lidocaine 1% w/o epi Laceration details:    Location:  Finger   Finger location:  R thumb   Length (cm):  2.5   Depth (mm):  2 Repair type:    Repair type:  Intermediate Pre-procedure details:    Preparation:  Patient  was prepped and draped in usual sterile fashion Exploration:    Hemostasis achieved with:  Direct pressure   Wound exploration: wound explored through full range of motion   Treatment:    Area cleansed with:  Betadine   Irrigation solution:  Sterile saline Skin repair:    Repair method:  Sutures   Suture size:  4-0   Suture material:  Prolene   Suture technique:  Simple interrupted   Number of sutures:  5 Approximation:    Approximation:  Close Post-procedure details:    Dressing:  Splint for protection and non-adherent dressing   Patient tolerance of procedure:  Tolerated well, no immediate complications .Nerve Block  Date/Time: 05/08/2019 12:12 PM Performed by: Eustaquio Maize, PA-C Authorized by: Eustaquio Maize, PA-C   Consent:    Consent obtained:  Verbal   Consent given by:  Patient Indications:    Indications:  Procedural anesthesia Location:    Nerve block body site: R thumb.   Laterality:  Right Pre-procedure details:    Skin preparation:  Alcohol Procedure details (see MAR for exact dosages):    Block needle gauge:  25 G   Anesthetic injected:  Lidocaine 1% w/o epi   Injection procedure:  Anatomic landmarks identified Post-procedure details:    Dressing:  None   Outcome:  Anesthesia achieved   Patient tolerance of procedure:  Tolerated well, no immediate complications   (including critical care time)  Medications Ordered in ED Medications  Tdap (BOOSTRIX) injection 0.5 mL (0.5 mLs Intramuscular Given 05/08/19 1023)  lidocaine (PF) (XYLOCAINE) 1 % injection 5 mL (5 mLs Infiltration Given by Other 05/08/19 1025)  meclizine (ANTIVERT) tablet 25 mg (25 mg Oral Given 05/08/19 1152)    ED Course  I have reviewed the triage vital signs and the nursing notes.  Pertinent labs & imaging results that were available during my care of the patient were reviewed by me and considered in my medical decision making (see chart for details).    MDM Rules/Calculators/A&P                       84 year old male who presents to the ED today initially complaining of mechanical fall that occurred prior to arrival with pain to his head, not anticoagulated and no loss of consciousness.  Patient also has laceration noted to right thumb however unable to say how he obtained this.  On arrival to the ED patient is afebrile, nontachycardic and nontachypneic.  He has no focal neuro deficits on exam today.  No signs of basilar skull fractures.  He does have a hematoma noted to his forehead and some pain to the bridge of his nose.  Will obtain CT scan of head, maxillofacial, C-spine as well as x-ray right thumb.  Patient will need sutures.   Xray of thumb negative for fracture or FB.   While in the room prepping for suture placement patient does report he actually began feeling dizzy prior to fall.  He does have history of ago and will meclizine as needed per daughter.  He has not taken any recently.  Given complaint of dizziness will obtain screening labs and EKG as well as orthostatic vital signs and reevaluate.  Meclizine has been ordered.  Attending physician Dr. Maryan Rued has evaluated patient and agrees with plan.  Sutures placed and splint applied.  CT scans do show a nasal fracture. No other abnormalities.  CBC without leukocytosis. Hgb stable compared to baseline.  CMP with glucose 130 and alk phos 825; pt does have prostate cancer with metastasis to the bone; currently on Nubeqa 300 mg BID and following with oncology however due to age no further tx sought including Chemo vs radiation. No other LFT abnormalities. Pt without abdominal pain; doubt biliary etiology.  Troponin of 6.  U/A without infection.   Pt reports improvement in symptoms after meclizine. Have advised to  continue taking at home PRN for dizziness and to follow up with PCP. Pt to have sutures removed in 5-7 days time. Instructed to wear splint to prevent stitches popping out. Pt stable for discharge home.   This  note was prepared using Dragon voice recognition software and may include unintentional dictation errors due to the inherent limitations of voice recognition software.   Final Clinical Impression(s) / ED Diagnoses Final diagnoses:  Fall, initial encounter  Injury of head, initial encounter  Closed fracture of nasal bone, initial encounter  Laceration of right thumb without foreign body without damage to nail, initial encounter  Vertigo    Rx / DC Orders ED Discharge Orders    None       Discharge Instructions     Please follow up with your PCP regarding your ED visit today.   Continue taking your Meclizine as needed for dizziness.   You will need to have your sutures removed in 5-7 days time. Please continue wearing splint to prevent bending of thumb until sutures can come out. Keep wound clean and dry. Return to the ED IMMEDIATELY for any signs of infection to the wound including redness/swelling around the wound itself, drainage of pus, fevers > 100.4  You may take Tylenol as needed for pain       Eustaquio Maize, PA-C 05/08/19 1412    Blanchie Dessert, MD 05/11/19 [id number]

## 2019-05-08 NOTE — Discharge Instructions (Signed)
Please follow up with your PCP regarding your ED visit today.   Continue taking your Meclizine as needed for dizziness.   You will need to have your sutures removed in 5-7 days time. Please continue wearing splint to prevent bending of thumb until sutures can come out. Keep wound clean and dry. Return to the ED IMMEDIATELY for any signs of infection to the wound including redness/swelling around the wound itself, drainage of pus, fevers > 100.4  You may take Tylenol as needed for pain

## 2019-05-09 ENCOUNTER — Ambulatory Visit: Payer: Medicare HMO | Admitting: Family

## 2019-05-11 ENCOUNTER — Encounter: Payer: Self-pay | Admitting: Family

## 2019-05-11 ENCOUNTER — Ambulatory Visit (INDEPENDENT_AMBULATORY_CARE_PROVIDER_SITE_OTHER): Payer: Medicare HMO | Admitting: Family

## 2019-05-11 ENCOUNTER — Ambulatory Visit
Admission: RE | Admit: 2019-05-11 | Discharge: 2019-05-11 | Disposition: A | Discharge status: Home / Self Care | Payer: Medicare HMO | Source: Ambulatory Visit | Attending: Family | Admitting: Family

## 2019-05-11 ENCOUNTER — Encounter: Payer: Self-pay | Admitting: *Deleted

## 2019-05-11 ENCOUNTER — Other Ambulatory Visit: Payer: Self-pay

## 2019-05-11 ENCOUNTER — Other Ambulatory Visit: Payer: Self-pay | Admitting: Family

## 2019-05-11 VITALS — BP 140/60 | HR 52 | Temp 97.7°F | Resp 18 | Ht 65.0 in | Wt 145.0 lb

## 2019-05-11 DIAGNOSIS — W19XXXD Unspecified fall, subsequent encounter: Secondary | ICD-10-CM | POA: Diagnosis not present

## 2019-05-11 DIAGNOSIS — R531 Weakness: Secondary | ICD-10-CM | POA: Diagnosis not present

## 2019-05-11 DIAGNOSIS — I10 Essential (primary) hypertension: Secondary | ICD-10-CM

## 2019-05-11 DIAGNOSIS — M25552 Pain in left hip: Secondary | ICD-10-CM

## 2019-05-11 DIAGNOSIS — H8110 Benign paroxysmal vertigo, unspecified ear: Secondary | ICD-10-CM

## 2019-05-11 DIAGNOSIS — C7951 Secondary malignant neoplasm of bone: Secondary | ICD-10-CM

## 2019-05-11 DIAGNOSIS — M79652 Pain in left thigh: Secondary | ICD-10-CM

## 2019-05-11 DIAGNOSIS — R2681 Unsteadiness on feet: Secondary | ICD-10-CM

## 2019-05-11 DIAGNOSIS — Y92009 Unspecified place in unspecified non-institutional (private) residence as the place of occurrence of the external cause: Secondary | ICD-10-CM

## 2019-05-11 DIAGNOSIS — C61 Malignant neoplasm of prostate: Secondary | ICD-10-CM

## 2019-05-11 LAB — POCT URINALYSIS DIPSTICK
Bilirubin, UA: NEGATIVE
Blood, UA: NEGATIVE
Glucose, UA: NEGATIVE
Ketones, UA: NEGATIVE
Leukocytes, UA: NEGATIVE
Nitrite, UA: NEGATIVE
Protein, UA: POSITIVE — AB
Spec Grav, UA: 1.015 (ref 1.010–1.025)
Urobilinogen, UA: 0.2 E.U./dL
pH, UA: 6 (ref 5.0–8.0)

## 2019-05-11 LAB — BASIC METABOLIC PANEL WITH GFR
BUN: 17 mg/dL (ref 7–25)
CO2: 28 mmol/L (ref 20–32)
Calcium: 9 mg/dL (ref 8.6–10.3)
Chloride: 103 mmol/L (ref 98–110)
Creat: 0.76 mg/dL (ref 0.70–1.11)
GFR, Est African American: 99 mL/min/{1.73_m2} (ref >=60)
GFR, Est Non African American: 86 mL/min/{1.73_m2} (ref >=60)
Glucose, Bld: 118 mg/dL — ABNORMAL HIGH (ref 65–99)
Potassium: 3.6 mmol/L (ref 3.5–5.3)
Sodium: 138 mmol/L (ref 135–146)

## 2019-05-11 LAB — CBC WITH DIFFERENTIAL/PLATELET
Absolute Monocytes: 380 cells/uL (ref 200–950)
Basophils Absolute: 28 cells/uL (ref 0–200)
Basophils Relative: 0.5 %
Eosinophils Absolute: 198 cells/uL (ref 15–500)
Eosinophils Relative: 3.6 %
HCT: 32.6 % — ABNORMAL LOW (ref 38.5–50.0)
Hemoglobin: 10.9 g/dL — ABNORMAL LOW (ref 13.2–17.1)
Lymphs Abs: 1848 cells/uL (ref 850–3900)
MCH: 29.8 pg (ref 27.0–33.0)
MCHC: 33.4 g/dL (ref 32.0–36.0)
MCV: 89.1 fL (ref 80.0–100.0)
MPV: 10.9 fL (ref 7.5–12.5)
Monocytes Relative: 6.9 %
Neutro Abs: 3047 cells/uL (ref 1500–7800)
Neutrophils Relative %: 55.4 %
Platelets: 226 10*3/uL (ref 140–400)
RBC: 3.66 10*6/uL — ABNORMAL LOW (ref 4.20–5.80)
RDW: 13.5 % (ref 11.0–15.0)
Total Lymphocyte: 33.6 %
WBC: 5.5 10*3/uL (ref 3.8–10.8)

## 2019-05-11 NOTE — Patient Instructions (Addendum)
-   Please get left hip/Thigh X-ray done at [facility] [address].will call you with results  - Labs done will call you with results  - Referral to Home health for Physical Therapy order placed Home health agency will call you

## 2019-05-11 NOTE — Progress Notes (Signed)
Left hip X-ray results shows no signs of fracture.Recommend Tylenol 500 mg tablet every 6 hours as needed for pain.

## 2019-05-11 NOTE — Progress Notes (Addendum)
Provider: Marlowe Sax FNP-C  , Nelda Bucks, NP  Patient Care Team: , Nelda Bucks, NP as PCP - General (Family Medicine) Kathie Rhodes, MD as Consulting Physician (Urology)  Extended Emergency Contact Information Primary Emergency Contact: Marcia Brash Mobile Phone: [phone] Relation: Daughter Secondary Emergency Contact: Evelina Dun Address: [address]          [place], [place] [address] Johnnette Litter of [place] Phone: [phone] Relation: Spouse  Code Status: Full Code  Goals of care: Advanced Directive information Advanced Directives 05/08/2019  Does Patient Have a Medical Advance Directive? No  Type of Advance Directive -  Does patient want to make changes to medical advance directive? -  Copy of [place] in Chart? -  Would patient like information on creating a medical advance directive? -     Chief Complaint  Patient presents with  . Acute Visit    general decline    HPI:  Pt is a 84 y.o. male seen today for an acute visit for evaluation of generalized weakness.He has a significant medical history of Hypertension,hx CVA,Asthma,BPV,Prostate Cancer with metatastic to multiple sites,Bradycardia,GERD among there conditions. He is here with his daughter Demietrice Edison Pace and Jeanett Schlein on sister's video call.He was seen in the ED 05/08/2019 post fall at home.He felt dizzy " spinning sensation" looking over his shoulder.He sustained a left forehead hematoma above eyebrow,nasal bone closed fracture and right thumb laceration.He tells me today that he was running to open the door for the daughter when he sustained the fall states tripped on his shoe. He had no loss of consciousness.Meclizine was given with much improvement.right Thumb was sutured stitches to be removed after 7 days. Labs stable.Urine analysis indicated cloudy with rare bacteria and crystals present negative for nitrites,leukocytes and no strong odor.  No fall since  hospital visit.Daughter states patient has declined since fall sleeps most of the time in bed.He states appetite is good though daughter states eats one good meal for breakfast and small portions for the rest of the meals.He denies any symptoms of depression. He complains of left hip /thigh area pain worst with movement.Has had pain since he fell.   Past Medical History:  Diagnosis Date  . Allergic rhinitis, cause unspecified   . Allergy   . Arthritis   . Asthma    AS A CHILD  . Cervicalgia   . Conjunctivitis unspecified   . Dizziness and giddiness   . Enthesopathy of unspecified site   . Generalized hyperhidrosis   . Lumbago   . Neoplasm of uncertain behavior of skin   . Other abnormal glucose   . Other and unspecified hyperlipidemia   . Rash and other nonspecific skin eruption   . Reflux esophagitis   . Stroke [facility])    MINI STROKES  . Unspecified constipation   . Unspecified essential hypertension   . Unspecified late effects of cerebrovascular disease    Past Surgical History:  Procedure Laterality Date  . COLONOSCOPY      No Known Allergies  Outpatient Encounter Medications as of 05/11/2019  Medication Sig  . amLODipine (NORVASC) 10 MG tablet Take 1 tablet by mouth once daily  . Cyanocobalamin (B-12 PO) Take 1 tablet by mouth daily.  . Darolutamide (NUBEQA) 300 MG TABS Take 300 mg by mouth 2 (two) times daily.  Marland Kitchen escitalopram (LEXAPRO) 10 MG tablet Take 1 tablet every evening  . lisinopril (ZESTRIL) 5 MG tablet TAKE 1 TABLET BY MOUTH ONCE DAILY FOR HIGH BLOOD PRESSURE  .  loratadine (CLARITIN) 10 MG tablet Take 10 mg by mouth as needed for allergies.  . Meclizine HCl (TRAVEL SICKNESS) 25 MG CHEW CHEW AND SWALLOW 1 TABLET BY MOUTH THREE TIMES DAILY AS NEEDED FOR DIZZINESS  . [DISCONTINUED] aspirin 81 MG tablet Take 1 tablet (81 mg total) by mouth daily. (Patient not taking: Reported on 05/08/2019)  . [DISCONTINUED] ENSURE (ENSURE) Take 237 mLs by mouth 3 (three) times daily  between meals.   No facility-administered encounter medications on file as of 05/11/2019.    Review of Systems  Constitutional: Negative for appetite change, chills, fatigue and fever.  HENT: Negative for congestion, rhinorrhea, sinus pressure, sinus pain, sneezing, sore throat and trouble swallowing.   Eyes: Negative for discharge, redness and itching.  Respiratory: Negative for cough, chest tightness, shortness of breath and wheezing.   Cardiovascular: Negative for chest pain, palpitations and leg swelling.  Gastrointestinal: Negative for abdominal distention, abdominal pain, constipation, diarrhea, nausea and vomiting.  Endocrine: Negative for cold intolerance, heat intolerance, polydipsia, polyphagia and polyuria.  Genitourinary: Negative for difficulty urinating, dysuria, flank pain, frequency, hematuria and urgency.  Musculoskeletal: Positive for arthralgias and gait problem. Negative for back pain and myalgias.  Skin: Negative for color change, pallor and rash.       Right thumb laceration stitches in place  Neurological: Negative for tremors, speech difficulty, weakness, light-headedness, numbness and headaches.       Chronic BPV  Hematological: Does not bruise/bleed easily.  Psychiatric/Behavioral: Positive for confusion. Negative for agitation and sleep disturbance. The patient is not nervous/anxious.     Immunization History  Administered Date(s) Administered  . Fluad Quad(high Dose 65+) 11/10/2018  . Influenza Split 12/20/2009, 10/25/2011  . Influenza, High Dose Seasonal PF 09/30/2016  . Influenza,inj,Quad PF,6+ Mos 01/24/2013, 12/23/2013  . Influenza-Unspecified 12/20/2014, 11/28/2015  . PFIZER SARS-COV-2 Vaccination 03/27/2019, 04/20/2019  . Pneumococcal Conjugate-13 08/07/2014  . Pneumococcal Polysaccharide-23 11/28/2015  . Tdap 05/08/2019   Pertinent  Health Maintenance Due  Topic Date Due  . INFLUENZA VACCINE  09/04/2019  . PNA vac Low Risk Adult  Completed  .  COLONOSCOPY  Discontinued   Fall Risk  05/11/2019 02/22/2019 02/16/2019 12/08/2018 11/10/2018  Falls in the past year? 1 0 0 0 0  Number falls in past yr: 1 0 0 0 0  Injury with Fall? 1 0 0 0 0  Risk for fall due to : - - - - -  Follow up - - Falls evaluation completed - -    Vitals:   05/11/19 0943  BP: 140/60  Pulse: (!) 52  Temp: 97.7 F (36.5 C)  TempSrc: Oral  SpO2: 93%  Weight: 145 lb (65.8 kg)  Height: 5' 5" (1.651 m)   Body mass index is 24.13 kg/m. Physical Exam Vitals reviewed.  Constitutional:      General: He is not in acute distress.    Appearance: He is normal weight. He is not ill-appearing.  HENT:     Head: Normocephalic.     Right Ear: Tympanic membrane, ear canal and external ear normal. There is no impacted cerumen.     Left Ear: Tympanic membrane, ear canal and external ear normal. There is no impacted cerumen.     Nose: Nose normal. No congestion or rhinorrhea.     Comments: Tender to palpate over nasal bridge.     Mouth/Throat:     Mouth: Mucous membranes are moist.     Pharynx: Oropharynx is clear. No oropharyngeal exudate or posterior oropharyngeal erythema.  Eyes:     General: No scleral icterus.       Right eye: No discharge.        Left eye: No discharge.     Extraocular Movements: Extraocular movements intact.     Conjunctiva/sclera: Conjunctivae normal.     Pupils: Pupils are equal, round, and reactive to light.  Neck:     Vascular: No carotid bruit.  Cardiovascular:     Rate and Rhythm: Regular rhythm. Bradycardia present.     Pulses: Normal pulses.     Heart sounds: Normal heart sounds. No murmur. No friction rub. No gallop.   Pulmonary:     Effort: Pulmonary effort is normal. No respiratory distress.     Breath sounds: Normal breath sounds. No wheezing, rhonchi or rales.  Chest:     Chest wall: No tenderness.  Abdominal:     General: Bowel sounds are normal. There is no distension.     Palpations: Abdomen is soft. There is no mass.      Tenderness: There is no abdominal tenderness. There is no right CVA tenderness, left CVA tenderness, guarding or rebound.  Musculoskeletal:     Cervical back: Normal range of motion. No rigidity or tenderness.     Right lower leg: No edema.     Left lower leg: No edema.       Legs:     Comments: Unsteady gait.   Lymphadenopathy:     Cervical: No cervical adenopathy.  Skin:    General: Skin is warm.     Coloration: Skin is not pale.     Findings: Bruising present. No erythema.     Comments: Left hip/Thigh area dark purple bruise   Neurological:     Mental Status: He is alert. Mental status is at baseline.     Cranial Nerves: No cranial nerve deficit.     Sensory: No sensory deficit.     Coordination: Coordination normal.     Gait: Gait abnormal.     Comments: Generalized weakness   Psychiatric:        Mood and Affect: Mood normal.        Behavior: Behavior normal.        Thought Content: Thought content normal.        Judgment: Judgment normal.     Labs reviewed: Recent Labs    08/24/18 0930 11/10/18 1547 05/08/19 1151  NA 144 140 140  K 4.0 4.3 3.6  CL 109 104 107  CO2 _0 GLUCOSE 99 86 130*  BUN _1 CREATININE 0.75 0.83 0.57*  CALCIUM 9.6 9.1 8.9   Recent Labs    08/24/18 0930 11/10/18 1547 05/08/19 1151  AST 14 12 36  ALT 5* 5* 45*  ALKPHOS  --   --  825*  BILITOT 0.3 0.2 0.5  PROT 6.5 6.3 7.0  ALBUMIN  --   --  3.8   Recent Labs    08/24/18 0930 11/10/18 1547 05/08/19 1151  WBC 4.2 4.9 5.6  NEUTROABS 2,087 2,303 3.4  HGB 10.5* 10.6* 10.6*  HCT 31.9* 31.2* 32.8*  MCV 87.9 87.2 90.6  PLT 251 273 206   Lab Results  Component Value Date   TSH 2.40 02/16/2019   Lab Results  Component Value Date   HGBA1C (H) 08/08/2008    6.2 (NOTE) The ADA recommends the following therapeutic goal for glycemic control related to Hgb A1c measurement: Goal of therapy: <6.5 Hgb A1c  Reference: American  Diabetes Association: Clinical Practice  Recommendations 2010, Diabetes Care, 2010, 33: (Suppl  1).   Lab Results  Component Value Date   CHOL 197 08/24/2018   HDL 51 08/24/2018   LDLCALC 128 (H) 08/24/2018   TRIG 79 08/24/2018   CHOLHDL 3.9 08/24/2018    Significant Diagnostic Results in last 30 days:  CT Head Wo Contrast  Result Date: 05/08/2019 CLINICAL DATA:  Facial and head trauma. Headache. EXAM: CT HEAD WITHOUT CONTRAST TECHNIQUE: Contiguous axial images were obtained from the base of the skull through the vertex without intravenous contrast. COMPARISON:  Brain MRI May 07, 2019 FINDINGS: Brain: No evidence of acute infarction, hemorrhage, hydrocephalus, extra-axial collection or mass lesion/mass effect. Advanced chronic microvascular disease. Stable appearance of chronic infarcts of the basal ganglia, thalamus and left cerebellum. Vascular: No hyperdense vessel or unexpected calcification. Skull: Normal. Negative for fracture or focal lesion. Sinuses/Orbits: No acute finding. Other: Left frontal scalp hematoma. IMPRESSION: 1. No acute intracranial abnormality. 2. Advanced brain parenchymal atrophy, chronic microvascular disease. 3. Stable appearance of chronic infarcts of the basal ganglia, thalamus and left cerebellum. Electronically Signed   By: Fidela Salisbury M.D.   On: 05/08/2019 11:35   CT Cervical Spine Wo Contrast  Result Date: 05/08/2019 CLINICAL DATA:  Trauma. Posterior neck pain. EXAM: CT CERVICAL SPINE WITHOUT CONTRAST TECHNIQUE: Multidetector CT imaging of the cervical spine was performed without intravenous contrast. Multiplanar CT image reconstructions were also generated. COMPARISON:  None. FINDINGS: Alignment: Straightening of the cervical lordosis. Skull base and vertebrae: No acute fracture. No primary bone lesion or focal pathologic process. Soft tissues and spinal canal: No prevertebral fluid or swelling. No visible canal hematoma. Disc levels: Multilevel osteoarthritic changes with extensive disc space  narrowing, degenerative remodeling of the vertebral bodies. Posterior facet arthropathy. Ankylosing changes at C4-C5, degenerative in etiology. Upper chest: Negative. Other: None. IMPRESSION: 1. No evidence of acute traumatic injury to the cervical spine. 2. Multilevel osteoarthritic changes of the cervical spine. Electronically Signed   By: Fidela Salisbury M.D.   On: 05/08/2019 11:44   MR BRAIN W WO CONTRAST  Result Date: 05/07/2019 CLINICAL DATA:  Memory loss EXAM: MRI HEAD WITHOUT AND WITH CONTRAST TECHNIQUE: Multiplanar, multiecho pulse sequences of the brain and surrounding structures were obtained without and with intravenous contrast. CONTRAST:  62m MULTIHANCE GADOBENATE DIMEGLUMINE 529 MG/ML IV SOLN COMPARISON:  2017 FINDINGS: Brain: There is no acute infarction or intracranial hemorrhage. There is no intracranial mass, mass effect, or edema. There is no hydrocephalus or extra-axial fluid collection. Prominence of the ventricles and sulci reflects generalized parenchymal volume loss of which has slightly increased. Confluent areas of T2 hyperintensity in the supratentorial much greater than pontine white matter are nonspecific but probably reflect advanced chronic microvascular ischemic changes. There are chronic infarcts of the central white matter, basal ganglia and thalamus, and left cerebellum. New small chronic right occipital infarct. New focus of susceptibility within the anterior left thalamus likely reflects chronic microhemorrhage. No abnormal enhancement. Vascular: Major vessel flow voids at the skull base are preserved. Skull and upper cervical spine: Normal marrow signal is preserved. Sinuses/Orbits: Paranasal sinuses are aerated. Orbits are unremarkable. Other: Sella is unremarkable.  Mastoid air cells are clear. IMPRESSION: No acute abnormality. Advanced chronic microvascular ischemic changes and chronic infarcts with some progression since 2017. Increased parenchymal volume loss since  2017. Electronically Signed   By: PMacy MisM.D.   On: 05/07/2019 21:23   DG Finger Thumb Right  Result Date: 05/08/2019 CLINICAL DATA:  Laceration following fall EXAM: RIGHT THUMB 2+V COMPARISON:  None. FINDINGS: Frontal, oblique, and lateral views obtained. There is an overlying bandage. No other radiopaque foreign body seen. No evidence soft tissue air. No demonstrable fracture or dislocation. There is severe osteoarthritic change in the first carpal-metacarpal joint. There is moderate osteoarthritic change in the scaphotrapezial joint. There is mild osteoarthritic change in the first IP joint. No erosive change. IMPRESSION: No fracture or dislocation. Osteoarthritic change, most marked in the first carpal-metacarpal joint. No radiopaque foreign body beyond overlying bandage. Electronically Signed   By: Lowella Grip III M.D.   On: 05/08/2019 10:29   CT Maxillofacial WO CM  Result Date: 05/08/2019 CLINICAL DATA:  Facial and head trauma. EXAM: CT MAXILLOFACIAL WITHOUT CONTRAST TECHNIQUE: Multidetector CT imaging of the maxillofacial structures was performed. Multiplanar CT image reconstructions were also generated. COMPARISON:  None. FINDINGS: Osseous: Minimally displaced distal left nasal bone fracture. Otherwise no facial fractures identified. Orbits: Negative. No traumatic or inflammatory finding. Sinuses: Clear. Soft tissues: Soft tissue swelling of the left frontal scalp and over the nose. Limited intracranial: No significant or unexpected finding. IMPRESSION: 1. Minimally displaced distal left nasal bone fracture. 2. Soft tissue swelling of the left frontal scalp and over the nose. Electronically Signed   By: Fidela Salisbury M.D.   On: 05/08/2019 11:40    Assessment/Plan 1. Generalized weakness Afebrile.Will rule out acute abnormalities.He has a diagnosis of prostate cancer with bone metastasis and Hx of benign position vertigo which could also be a contributory factor for his  generalized weakness.  - Ambulatory referral to [place] for exercise,ROM and muscle strengthening. - CBC with Differential/Platelet - BMP with eGFR([facility]) - POC Urinalysis Dipstick to rule out UTI   2. Fall at home, subsequent encounter Unwitnessed fall.No loss of consciousness. Evaluated in the ED sustained right thumb laceration which was repaired with stitches.Also sustained nasal fracture.  - Ambulatory referral to [place] for ROM,gait stability and muscle strengthening.  - CBC with Differential/Platelet - BMP with eGFR([facility]) - POC Urinalysis Dipstick indicates cloudy,positive for protein and negative for nitrites.Encouraged to increase water intake.   3. Acute hip pain, left Pain since post fall episode 05/08/2019. Left hip/Thigh area dark purple bruise tender to palpation.  Will obtain left hip X-ray rule out fracture.  4. Pain of left thigh Left hip/Thigh area dark purple bruise tender to palpation. Will obtain left femur X-ray rule out fracture.  5. Essential hypertension, benign B/p stable.continue on Amlodipine 10 mg tablet daily and Lisinopril 5 mg tablet daily.   6.Unsteady gait  Status post fall as above.Fall and safety precautions discussed. Home health Physical Therapy for ROM,gait stability and muscle strengthening.  7. Prostate cancer metastatic to bone ([place]) Continue to follow up with Oncologist.could be a contributory factor with his falls and generalized weakness.will order [facility] Physical Therapy as above.     8. Benign paroxysmal positional vertigo, unspecified laterality Could be a contributory factor for his falls.will order [facility] Physical Therapy as above.    Family/ staff Communication: Reviewed plan of care with patient and daughters verbalized understanding.   Labs/tests ordered:   -  left femur/hip X-ray 2-3 views rule out fracture. - CBC with Differential/Platelet - BMP with eGFR([facility]) - POC Urinalysis Dipstick to rule out UTI   Next  Appointment:   Sandrea Hughs, NP

## 2019-05-11 NOTE — Progress Notes (Signed)
Left thigh ( femur ) shows no fracture.Recommended tylenol 500 mg tablet one by mouth every 6 hours as needed for pain.

## 2019-05-16 ENCOUNTER — Ambulatory Visit: Payer: Medicare HMO | Admitting: Family

## 2019-05-16 ENCOUNTER — Other Ambulatory Visit: Payer: Self-pay

## 2019-05-16 ENCOUNTER — Encounter: Payer: Self-pay | Admitting: Family

## 2019-05-16 ENCOUNTER — Ambulatory Visit (INDEPENDENT_AMBULATORY_CARE_PROVIDER_SITE_OTHER): Payer: Medicare HMO | Admitting: Family

## 2019-05-16 VITALS — BP 138/70 | HR 55 | Temp 97.8°F | Ht 65.0 in | Wt 144.0 lb

## 2019-05-16 DIAGNOSIS — G3184 Mild cognitive impairment, so stated: Secondary | ICD-10-CM | POA: Diagnosis not present

## 2019-05-16 DIAGNOSIS — R2681 Unsteadiness on feet: Secondary | ICD-10-CM

## 2019-05-16 DIAGNOSIS — W19XXXD Unspecified fall, subsequent encounter: Secondary | ICD-10-CM

## 2019-05-16 DIAGNOSIS — S61011D Laceration without foreign body of right thumb without damage to nail, subsequent encounter: Secondary | ICD-10-CM | POA: Diagnosis not present

## 2019-05-16 DIAGNOSIS — Y92009 Unspecified place in unspecified non-institutional (private) residence as the place of occurrence of the external cause: Secondary | ICD-10-CM

## 2019-05-16 NOTE — Patient Instructions (Signed)
-   right thumb laceration healing well. Clean with saline or sterile alcohol pad ,pat dry and cover with Band Aid change band aid daily until healed.  Notify provider for any signs of infection or drainage.  - Order place for Home health Nurse assist Agency will call you for appointment

## 2019-05-16 NOTE — Progress Notes (Signed)
Provider: Marlowe Sax FNP-C  Tehran Rabenold, Nelda Bucks, NP  Patient Care Team: Joslynne Klatt, Nelda Bucks, NP as PCP - General (Family Medicine) Kathie Rhodes, MD as Consulting Physician (Urology)  Extended Emergency Contact Information Primary Emergency Contact: Marcia Brash Mobile Phone: [phone] Relation: Daughter Secondary Emergency Contact: Evelina Dun Address: [address]          [place], [place] [address] Johnnette Litter of [place] Phone: [phone] Relation: Spouse  Code Status:  Full code  Goals of care: Advanced Directive information Advanced Directives 05/08/2019  Does Patient Have a Medical Advance Directive? No  Type of Advance Directive -  Does patient want to make changes to medical advance directive? -  Copy of [place] in Chart? -  Would patient like information on creating a medical advance directive? -     Chief Complaint  Patient presents with  . Suture / Staple Removal    suture removal    HPI:  Pt is a 84 y.o. male seen today for an acute visit for right thumb suture removal.He is here with the daughter.He status post fall at home episode 05/08/2019 was evaluated in ED.His right Thumb laceration was repaired with x 5 removable sutures.He states has had no drainage from laceration site.Has kept dressing  Dry and clean.He denies any fever or chills.  Patient's daughter Lelon Frohlich on the sister's phone requested Home health skilled Nurse service for patient.clarified with patient that for skilled Home health Nurse to be ordered patient has to have need for skilled services such as wound care or medication management.she states patient has no wounds and family takes care of the medication.Daughter request Home health Nurse assistant to assist with patient's ADL's. Patient's son comes in once a week to give patient a shower daughter's don't give him a shower for patient's  privacy.   Past Medical History:  Diagnosis Date  . Allergic rhinitis,  cause unspecified   . Allergy   . Arthritis   . Asthma    AS A CHILD  . Cervicalgia   . Conjunctivitis unspecified   . Dizziness and giddiness   . Enthesopathy of unspecified site   . Generalized hyperhidrosis   . Lumbago   . Neoplasm of uncertain behavior of skin   . Other abnormal glucose   . Other and unspecified hyperlipidemia   . Rash and other nonspecific skin eruption   . Reflux esophagitis   . Stroke [facility])    MINI STROKES  . Unspecified constipation   . Unspecified essential hypertension   . Unspecified late effects of cerebrovascular disease    Past Surgical History:  Procedure Laterality Date  . COLONOSCOPY      No Known Allergies  Outpatient Encounter Medications as of 05/16/2019  Medication Sig  . amLODipine (NORVASC) 10 MG tablet Take 1 tablet by mouth once daily  . Cyanocobalamin (B-12 PO) Take 1 tablet by mouth daily.  . Darolutamide (NUBEQA) 300 MG TABS Take 300 mg by mouth 2 (two) times daily.  Marland Kitchen escitalopram (LEXAPRO) 10 MG tablet Take 1 tablet every evening  . lisinopril (ZESTRIL) 5 MG tablet TAKE 1 TABLET BY MOUTH ONCE DAILY FOR HIGH BLOOD PRESSURE  . loratadine (CLARITIN) 10 MG tablet Take 10 mg by mouth as needed for allergies.  . Meclizine HCl (TRAVEL SICKNESS) 25 MG CHEW CHEW AND SWALLOW 1 TABLET BY MOUTH THREE TIMES DAILY AS NEEDED FOR DIZZINESS   No facility-administered encounter medications on file as of 05/16/2019.    Review of Systems  Constitutional: Negative for appetite change, chills, fatigue and fever.  Respiratory: Negative for cough, chest tightness, shortness of breath and wheezing.   Cardiovascular: Negative for chest pain, palpitations and leg swelling.  Musculoskeletal: Positive for arthralgias and gait problem.  Skin: Negative for color change, pallor and rash.       Right thumb laceration site with x 5 sutures intake.   Neurological: Negative for dizziness, light-headedness and headaches.  Psychiatric/Behavioral: Negative for  agitation and sleep disturbance. The patient is not nervous/anxious.     Immunization History  Administered Date(s) Administered  . Fluad Quad(high Dose 65+) 11/10/2018  . Influenza Split 12/20/2009, 10/25/2011  . Influenza, High Dose Seasonal PF 09/30/2016  . Influenza,inj,Quad PF,6+ Mos 01/24/2013, 12/23/2013  . Influenza-Unspecified 12/20/2014, 11/28/2015  . PFIZER SARS-COV-2 Vaccination 03/27/2019, 04/20/2019  . Pneumococcal Conjugate-13 08/07/2014  . Pneumococcal Polysaccharide-23 11/28/2015  . Tdap 05/08/2019   Pertinent  Health Maintenance Due  Topic Date Due  . INFLUENZA VACCINE  09/04/2019  . PNA vac Low Risk Adult  Completed  . COLONOSCOPY  Discontinued   Fall Risk  05/11/2019 02/22/2019 02/16/2019 12/08/2018 11/10/2018  Falls in the past year? 1 0 0 0 0  Number falls in past yr: 1 0 0 0 0  Injury with Fall? 1 0 0 0 0  Risk for fall due to : - - - - -  Follow up - - Falls evaluation completed - -    Vitals:   05/16/19 1312  BP: 138/70  Pulse: (!) 55  Temp: 97.8 F (36.6 C)  TempSrc: Tympanic  SpO2: 96%  Weight: 144 lb (65.3 kg)  Height: 5\' 5"  (1.651 m)   Body mass index is 23.96 kg/m. Physical Exam Vitals reviewed.  Constitutional:      General: He is not in acute distress.    Appearance: He is not ill-appearing.  Eyes:     General: No scleral icterus.       Right eye: No discharge.        Left eye: No discharge.     Conjunctiva/sclera: Conjunctivae normal.     Pupils: Pupils are equal, round, and reactive to light.  Neck:     Vascular: No carotid bruit.  Cardiovascular:     Rate and Rhythm: Normal rate and regular rhythm.     Pulses: Normal pulses.     Heart sounds: Normal heart sounds. No murmur. No friction rub. No gallop.   Pulmonary:     Effort: Pulmonary effort is normal. No respiratory distress.     Breath sounds: Normal breath sounds. No wheezing, rhonchi or rales.  Chest:     Chest wall: No tenderness.  Abdominal:     General: Bowel sounds  are normal. There is no distension.     Palpations: Abdomen is soft. There is no mass.     Tenderness: There is no abdominal tenderness. There is no right CVA tenderness, guarding or rebound.  Musculoskeletal:        General: No swelling or tenderness.     Cervical back: No rigidity or tenderness.     Right lower leg: No edema.     Left lower leg: No edema.     Comments: Unsteady gait   Lymphadenopathy:     Cervical: No cervical adenopathy.  Skin:    General: Skin is warm.     Coloration: Skin is not jaundiced or pale.     Findings: No bruising, erythema or rash.     Comments: Right Thumb previous  laceration site cleansed with sterile alcohol pad,Betadine and suture x 5 removed intact.laceration closed.No signs of infection or drainage noted.Moves thumb without any pain or stiffness. Patient tolerated procedure well. Site cleansed with betadine and covered with Band aid.   Neurological:     Mental Status: He is alert. Mental status is at baseline.     Sensory: No sensory deficit.     Motor: No weakness.     Gait: Gait abnormal.  Psychiatric:        Mood and Affect: Mood normal.        Behavior: Behavior normal.        Thought Content: Thought content normal.        Judgment: Judgment normal.    Labs reviewed: Recent Labs    11/10/18 1547 05/08/19 1151 05/11/19 1043  NA 140 140 138  K 4.3 3.6 3.6  CL 104 107 103  CO2 31 24 28   GLUCOSE 86 130* 118*  BUN 20 17 17   CREATININE 0.83 0.57* 0.76  CALCIUM 9.1 8.9 9.0   Recent Labs    08/24/18 0930 11/10/18 1547 05/08/19 1151  AST 14 12 36  ALT 5* 5* 45*  ALKPHOS  --   --  825*  BILITOT 0.3 0.2 0.5  PROT 6.5 6.3 7.0  ALBUMIN  --   --  3.8   Recent Labs    11/10/18 1547 05/08/19 1151 05/11/19 1043  WBC 4.9 5.6 5.5  NEUTROABS 2,303 3.4 3,047  HGB 10.6* 10.6* 10.9*  HCT 31.2* 32.8* 32.6*  MCV 87.2 90.6 89.1  PLT 273 206 226   Lab Results  Component Value Date   TSH 2.40 02/16/2019   Lab Results  Component  Value Date   HGBA1C (H) 08/08/2008    6.2 (NOTE) The ADA recommends the following therapeutic goal for glycemic control related to Hgb A1c measurement: Goal of therapy: <6.5 Hgb A1c  Reference: American Diabetes Association: Clinical Practice Recommendations 2010, Diabetes Care, 2010, 33: (Suppl  1).   Lab Results  Component Value Date   CHOL 197 08/24/2018   HDL 51 08/24/2018   LDLCALC 128 (H) 08/24/2018   TRIG 79 08/24/2018   CHOLHDL 3.9 08/24/2018    Significant Diagnostic Results in last 30 days:  DG Pelvis 1-2 Views  Result Date: 05/11/2019 CLINICAL DATA:  Fall.  Left hip pain EXAM: PELVIS - 1-2 VIEW COMPARISON:  None. FINDINGS: There is no evidence of pelvic fracture or diastasis. No pelvic bone lesions are seen. IMPRESSION: Negative. Electronically Signed   By: Franchot Gallo M.D.   On: 05/11/2019 16:27   CT Head Wo Contrast  Result Date: 05/08/2019 CLINICAL DATA:  Facial and head trauma. Headache. EXAM: CT HEAD WITHOUT CONTRAST TECHNIQUE: Contiguous axial images were obtained from the base of the skull through the vertex without intravenous contrast. COMPARISON:  Brain MRI May 07, 2019 FINDINGS: Brain: No evidence of acute infarction, hemorrhage, hydrocephalus, extra-axial collection or mass lesion/mass effect. Advanced chronic microvascular disease. Stable appearance of chronic infarcts of the basal ganglia, thalamus and left cerebellum. Vascular: No hyperdense vessel or unexpected calcification. Skull: Normal. Negative for fracture or focal lesion. Sinuses/Orbits: No acute finding. Other: Left frontal scalp hematoma. IMPRESSION: 1. No acute intracranial abnormality. 2. Advanced brain parenchymal atrophy, chronic microvascular disease. 3. Stable appearance of chronic infarcts of the basal ganglia, thalamus and left cerebellum. Electronically Signed   By: Fidela Salisbury M.D.   On: 05/08/2019 11:35   CT Cervical Spine Wo Contrast  Result Date:  05/08/2019 CLINICAL DATA:  Trauma.  Posterior neck pain. EXAM: CT CERVICAL SPINE WITHOUT CONTRAST TECHNIQUE: Multidetector CT imaging of the cervical spine was performed without intravenous contrast. Multiplanar CT image reconstructions were also generated. COMPARISON:  None. FINDINGS: Alignment: Straightening of the cervical lordosis. Skull base and vertebrae: No acute fracture. No primary bone lesion or focal pathologic process. Soft tissues and spinal canal: No prevertebral fluid or swelling. No visible canal hematoma. Disc levels: Multilevel osteoarthritic changes with extensive disc space narrowing, degenerative remodeling of the vertebral bodies. Posterior facet arthropathy. Ankylosing changes at C4-C5, degenerative in etiology. Upper chest: Negative. Other: None. IMPRESSION: 1. No evidence of acute traumatic injury to the cervical spine. 2. Multilevel osteoarthritic changes of the cervical spine. Electronically Signed   By: Fidela Salisbury M.D.   On: 05/08/2019 11:44   MR BRAIN W WO CONTRAST  Result Date: 05/07/2019 CLINICAL DATA:  Memory loss EXAM: MRI HEAD WITHOUT AND WITH CONTRAST TECHNIQUE: Multiplanar, multiecho pulse sequences of the brain and surrounding structures were obtained without and with intravenous contrast. CONTRAST:  73mL MULTIHANCE GADOBENATE DIMEGLUMINE 529 MG/ML IV SOLN COMPARISON:  2017 FINDINGS: Brain: There is no acute infarction or intracranial hemorrhage. There is no intracranial mass, mass effect, or edema. There is no hydrocephalus or extra-axial fluid collection. Prominence of the ventricles and sulci reflects generalized parenchymal volume loss of which has slightly increased. Confluent areas of T2 hyperintensity in the supratentorial much greater than pontine white matter are nonspecific but probably reflect advanced chronic microvascular ischemic changes. There are chronic infarcts of the central white matter, basal ganglia and thalamus, and left cerebellum. New small chronic right occipital infarct. New  focus of susceptibility within the anterior left thalamus likely reflects chronic microhemorrhage. No abnormal enhancement. Vascular: Major vessel flow voids at the skull base are preserved. Skull and upper cervical spine: Normal marrow signal is preserved. Sinuses/Orbits: Paranasal sinuses are aerated. Orbits are unremarkable. Other: Sella is unremarkable.  Mastoid air cells are clear. IMPRESSION: No acute abnormality. Advanced chronic microvascular ischemic changes and chronic infarcts with some progression since 2017. Increased parenchymal volume loss since 2017. Electronically Signed   By: Macy Mis M.D.   On: 05/07/2019 21:23   DG Finger Thumb Right  Result Date: 05/08/2019 CLINICAL DATA:  Laceration following fall EXAM: RIGHT THUMB 2+V COMPARISON:  None. FINDINGS: Frontal, oblique, and lateral views obtained. There is an overlying bandage. No other radiopaque foreign body seen. No evidence soft tissue air. No demonstrable fracture or dislocation. There is severe osteoarthritic change in the first carpal-metacarpal joint. There is moderate osteoarthritic change in the scaphotrapezial joint. There is mild osteoarthritic change in the first IP joint. No erosive change. IMPRESSION: No fracture or dislocation. Osteoarthritic change, most marked in the first carpal-metacarpal joint. No radiopaque foreign body beyond overlying bandage. Electronically Signed   By: Lowella Grip III M.D.   On: 05/08/2019 10:29   DG FEMUR MIN 2 VIEWS LEFT  Result Date: 05/11/2019 CLINICAL DATA:  Fall. EXAM: LEFT FEMUR 2 VIEWS COMPARISON:  None. FINDINGS: Negative for fracture. Normal hip and knee joint. Mild arterial calcification. IMPRESSION: No acute abnormality Electronically Signed   By: Franchot Gallo M.D.   On: 05/11/2019 16:28   CT Maxillofacial WO CM  Result Date: 05/08/2019 CLINICAL DATA:  Facial and head trauma. EXAM: CT MAXILLOFACIAL WITHOUT CONTRAST TECHNIQUE: Multidetector CT imaging of the maxillofacial  structures was performed. Multiplanar CT image reconstructions were also generated. COMPARISON:  None. FINDINGS: Osseous: Minimally displaced distal left nasal bone fracture.  Otherwise no facial fractures identified. Orbits: Negative. No traumatic or inflammatory finding. Sinuses: Clear. Soft tissues: Soft tissue swelling of the left frontal scalp and over the nose. Limited intracranial: No significant or unexpected finding. IMPRESSION: 1. Minimally displaced distal left nasal bone fracture. 2. Soft tissue swelling of the left frontal scalp and over the nose. Electronically Signed   By: Fidela Salisbury M.D.   On: 05/08/2019 11:40    Assessment/Plan 1. Laceration of right thumb without foreign body without damage to nail, subsequent encounter Afebrile.Right Thumb previous laceration site cleansed with sterile alcohol pad,Betadine and suture x 5 removed intact.laceration closed.No signs of infection or drainage noted.Moves thumb without any pain or stiffness.Cleansed with Betadine and Band Aid applied. -Advised to Cleanse with saline or sterile alcohol pad ,pat dry and cover with Band Aid change band aid daily until healed.  Notify provider for any signs of infection or drainage.  2. Mild cognitive impairment Requires assistance with ADL's. - Ambulatory referral to [facility] for Nurse assistant to assisit with ADL's.I've discussed with patient's daughter present during visit and another sister Lelon Frohlich on sister's phone that [facility] CNA service might not be covered by [employer] and might need to pay out of pocket for services.Both sisters verbalized understanding.   3. Fall at home, subsequent encounter No fall episode since previous one. - Ambulatory referral to [facility] Nurse assistant to assist with activity of daily Living.   4. Unsteady gait Continue with Physical Therapy for gait stability and muscle strengthening.  - Ambulatory referral to [facility] as above.   Family/ staff  Communication: Reviewed plan of care with patient and daughter verbalized understanding.   Labs/tests ordered: None   Next Appointment:   Sandrea Hughs, NP

## 2019-05-19 ENCOUNTER — Telehealth: Payer: Self-pay | Admitting: *Deleted

## 2019-05-19 DIAGNOSIS — R001 Bradycardia, unspecified: Secondary | ICD-10-CM

## 2019-05-19 DIAGNOSIS — H8110 Benign paroxysmal vertigo, unspecified ear: Secondary | ICD-10-CM

## 2019-05-19 NOTE — Telephone Encounter (Signed)
Recommend referral to Cardiologist he does have chronic bradycardia and benign  paroxysmal positional vertigo

## 2019-05-19 NOTE — Telephone Encounter (Signed)
Gulena with [facility] notified and agreed. She will contact family.  Referral placed.

## 2019-05-19 NOTE — Telephone Encounter (Signed)
Gulena with [facility] called and stated she saw patient today and his pulse was running 46,45,44 bpm. Complains of some positional dizziness. BP 110/70.  Taking medication as directed. Nurse just wanted to make aware.

## 2019-05-24 ENCOUNTER — Other Ambulatory Visit: Payer: Self-pay | Admitting: *Deleted

## 2019-05-24 DIAGNOSIS — I10 Essential (primary) hypertension: Secondary | ICD-10-CM

## 2019-05-24 MED ORDER — ESCITALOPRAM OXALATE 10 MG PO TABS: ORAL_TABLET | ORAL | 1 refills | Status: AC

## 2019-05-24 MED ORDER — LISINOPRIL 5 MG PO TABS: 5.0000 mg | ORAL_TABLET | Freq: Every day | ORAL | 1 refills | Status: AC

## 2019-05-24 MED ORDER — AMLODIPINE BESYLATE 10 MG PO TABS: 10.0000 mg | ORAL_TABLET | Freq: Every day | ORAL | 1 refills | Status: AC

## 2019-05-24 NOTE — Telephone Encounter (Signed)
Caregiver requested refill to be sent to Pharmacy Faxed.

## 2019-05-31 ENCOUNTER — Other Ambulatory Visit: Payer: Self-pay | Admitting: Urology

## 2019-05-31 DIAGNOSIS — C61 Malignant neoplasm of prostate: Secondary | ICD-10-CM

## 2019-05-31 DIAGNOSIS — C7951 Secondary malignant neoplasm of bone: Secondary | ICD-10-CM

## 2019-06-01 ENCOUNTER — Encounter: Payer: Self-pay | Admitting: Family

## 2019-06-01 ENCOUNTER — Ambulatory Visit (INDEPENDENT_AMBULATORY_CARE_PROVIDER_SITE_OTHER): Payer: Medicare HMO | Admitting: Family

## 2019-06-01 ENCOUNTER — Other Ambulatory Visit: Payer: Self-pay

## 2019-06-01 VITALS — BP 138/68 | HR 51 | Temp 97.5°F | Ht 65.0 in | Wt 140.0 lb

## 2019-06-01 DIAGNOSIS — R634 Abnormal weight loss: Secondary | ICD-10-CM | POA: Diagnosis not present

## 2019-06-01 DIAGNOSIS — R1312 Dysphagia, oropharyngeal phase: Secondary | ICD-10-CM | POA: Diagnosis not present

## 2019-06-01 DIAGNOSIS — R001 Bradycardia, unspecified: Secondary | ICD-10-CM | POA: Diagnosis not present

## 2019-06-01 NOTE — Patient Instructions (Signed)
-   Notify provide or go to ED if swallowing issues worsen.Referral place for speech Therapist will call you. - Referral place for cardiologist for low heart rate specialist office will call you

## 2019-06-01 NOTE — Progress Notes (Signed)
Provider: Marlowe Sax FNP-C  Aliyana Dlugosz, Nelda Bucks, NP  Patient Care Team: Daelynn Blower, Nelda Bucks, NP as PCP - General (Family Medicine) Kathie Rhodes, MD as Consulting Physician (Urology)  Extended Emergency Contact Information Primary Emergency Contact: Marcia Brash Mobile Phone: [phone] Relation: Daughter Secondary Emergency Contact: Evelina Dun Address: [address]          [place], [place] [address] Johnnette Litter of [place] Phone: [phone] Relation: Spouse  Code Status: Full Code  Goals of care: Advanced Directive information Advanced Directives 05/08/2019  Does Patient Have a Medical Advance Directive? No  Type of Advance Directive -  Does patient want to make changes to medical advance directive? -  Copy of [place] in Chart? -  Would patient like information on creating a medical advance directive? -     Chief Complaint  Patient presents with  . Acute Visit    ? swallowing issues. Patient denies trouble swalllowing, but has lost 4 pounds in 16 days. Accompanied by daughter, Demetrice, FaceTime with Jeanett Schlein    HPI:  Pt is a 84 y.o. male seen today for an acute visit for evaluation of swallowing issues. He is here with daughter Demetrice Edison Pace and Jeanett Schlein on sister Face Time.Daughter states patient coughs and chokes whenever he drinks or eat though patient denies swallowing issues.Daughter reminded him choking on water.Also states patient not eating has lots 4 lbs over 16 days.He states has no appetite.He denies any fever,chills,nausea,vomiting or URI or UTI symptoms.Physical Therapy continues to work with him post fall episode 05/08/2019. Daughter also concerned states whenever he exercises with Physical therapist his heart rate drops down  to 40's.He denies any dizziness,syncope/near syncope.weakness,chest pain or palpitation.   Past Medical History:  Diagnosis Date  . Allergic rhinitis, cause unspecified   . Allergy   . Arthritis    . Asthma    AS A CHILD  . Cervicalgia   . Conjunctivitis unspecified   . Dizziness and giddiness   . Enthesopathy of unspecified site   . Generalized hyperhidrosis   . Lumbago   . Neoplasm of uncertain behavior of skin   . Other abnormal glucose   . Other and unspecified hyperlipidemia   . Rash and other nonspecific skin eruption   . Reflux esophagitis   . Stroke [facility])    MINI STROKES  . Unspecified constipation   . Unspecified essential hypertension   . Unspecified late effects of cerebrovascular disease    Past Surgical History:  Procedure Laterality Date  . COLONOSCOPY      No Known Allergies  Outpatient Encounter Medications as of 06/01/2019  Medication Sig  . amLODipine (NORVASC) 10 MG tablet Take 1 tablet (10 mg total) by mouth daily.  . Cyanocobalamin (B-12 PO) Take 1 tablet by mouth daily. ? Dose, possibly 2000 units qd  . Darolutamide (NUBEQA) 300 MG TABS Take 300 mg by mouth 2 (two) times daily.  Marland Kitchen escitalopram (LEXAPRO) 10 MG tablet Take 1 tablet every evening  . lisinopril (ZESTRIL) 5 MG tablet Take 1 tablet (5 mg total) by mouth daily. for high blood pressure  . loratadine (CLARITIN) 10 MG tablet Take 10 mg by mouth as needed for allergies.  . Meclizine HCl (TRAVEL SICKNESS) 25 MG CHEW CHEW AND SWALLOW 1 TABLET BY MOUTH THREE TIMES DAILY AS NEEDED FOR DIZZINESS   No facility-administered encounter medications on file as of 06/01/2019.    Review of Systems  Unable to perform ROS: Dementia (Additional information provided by patient's daughters)  Constitutional: Positive for appetite change. Negative for chills, fatigue and fever.  HENT: Negative for congestion, rhinorrhea, sinus pressure, sinus pain, sneezing and sore throat.        Swallowing issues per HPI   Respiratory: Negative for cough, chest tightness, shortness of breath and wheezing.   Cardiovascular: Negative for chest pain, palpitations and leg swelling.       Low heart rate per HPI     Gastrointestinal: Negative for abdominal distention, abdominal pain, constipation, diarrhea, nausea and vomiting.  Endocrine: Negative for cold intolerance, heat intolerance, polydipsia, polyphagia and polyuria.  Genitourinary: Negative for difficulty urinating, dysuria, flank pain, frequency and urgency.  Musculoskeletal: Positive for gait problem. Negative for myalgias.  Skin: Negative for color change, pallor and rash.  Neurological: Negative for dizziness, syncope, speech difficulty, weakness, light-headedness and headaches.  Psychiatric/Behavioral: Positive for confusion. Negative for agitation and sleep disturbance. The patient is not nervous/anxious.     Immunization History  Administered Date(s) Administered  . Fluad Quad(high Dose 65+) 11/10/2018  . Influenza Split 12/20/2009, 10/25/2011  . Influenza, High Dose Seasonal PF 09/30/2016  . Influenza,inj,Quad PF,6+ Mos 01/24/2013, 12/23/2013  . Influenza-Unspecified 12/20/2014, 11/28/2015  . PFIZER SARS-COV-2 Vaccination 03/27/2019, 04/20/2019  . Pneumococcal Conjugate-13 08/07/2014  . Pneumococcal Polysaccharide-23 11/28/2015  . Tdap 05/08/2019   Pertinent  Health Maintenance Due  Topic Date Due  . INFLUENZA VACCINE  09/04/2019  . PNA vac Low Risk Adult  Completed  . COLONOSCOPY  Discontinued   Fall Risk  06/01/2019 05/11/2019 02/22/2019 02/16/2019 12/08/2018  Falls in the past year? 1 1 0 0 0  Number falls in past yr: - 1 0 0 0  Injury with Fall? 1 1 0 0 0  Risk for fall due to : - - - - -  Follow up - - - Falls evaluation completed -    Vitals:   06/01/19 1428  BP: 138/68  Pulse: (!) 51  Temp: (!) 97.5 F (36.4 C)  TempSrc: Temporal  SpO2: 97%  Weight: 140 lb (63.5 kg)  Height: 5\' 5"  (1.651 m)   Body mass index is 23.3 kg/m. Physical Exam Vitals reviewed.  Constitutional:      General: He is not in acute distress.    Appearance: He is normal weight. He is not ill-appearing.  HENT:     Head: Normocephalic.      Mouth/Throat:     Mouth: Mucous membranes are moist.     Pharynx: Oropharynx is clear. No oropharyngeal exudate or posterior oropharyngeal erythema.  Eyes:     General: No scleral icterus.       Right eye: No discharge.        Left eye: No discharge.     Conjunctiva/sclera: Conjunctivae normal.     Pupils: Pupils are equal, round, and reactive to light.  Neck:     Vascular: No carotid bruit.  Cardiovascular:     Rate and Rhythm: Bradycardia present.     Pulses: Normal pulses.     Heart sounds: Normal heart sounds. No murmur. No friction rub. No gallop.   Pulmonary:     Effort: Pulmonary effort is normal. No respiratory distress.     Breath sounds: Normal breath sounds. No wheezing, rhonchi or rales.  Chest:     Chest wall: No tenderness.  Abdominal:     General: Bowel sounds are normal. There is no distension.     Palpations: Abdomen is soft. There is no mass.     Tenderness: There is no  abdominal tenderness. There is no right CVA tenderness, left CVA tenderness, guarding or rebound.  Musculoskeletal:        General: No swelling or tenderness.     Cervical back: Normal range of motion. No rigidity or tenderness.     Right lower leg: No edema.     Left lower leg: No edema.     Comments: Unsteady gait not using any device  Skin:    General: Skin is warm and dry.     Coloration: Skin is not pale.     Findings: No erythema or rash.  Neurological:     Mental Status: He is alert. Mental status is at baseline.     Cranial Nerves: No cranial nerve deficit.     Sensory: No sensory deficit.     Motor: No weakness.     Gait: Gait abnormal.  Psychiatric:        Mood and Affect: Mood normal.        Speech: Speech normal.        Behavior: Behavior normal.        Thought Content: Thought content normal.        Cognition and Memory: Memory is impaired.    Labs reviewed: Recent Labs    11/10/18 1547 05/08/19 1151 05/11/19 1043  NA 140 140 138  K 4.3 3.6 3.6  CL 104 107 103   CO2 31 24 28   GLUCOSE 86 130* 118*  BUN 20 17 17   CREATININE 0.83 0.57* 0.76  CALCIUM 9.1 8.9 9.0   Recent Labs    08/24/18 0930 11/10/18 1547 05/08/19 1151  AST 14 12 36  ALT 5* 5* 45*  ALKPHOS  --   --  825*  BILITOT 0.3 0.2 0.5  PROT 6.5 6.3 7.0  ALBUMIN  --   --  3.8   Recent Labs    11/10/18 1547 05/08/19 1151 05/11/19 1043  WBC 4.9 5.6 5.5  NEUTROABS 2,303 3.4 3,047  HGB 10.6* 10.6* 10.9*  HCT 31.2* 32.8* 32.6*  MCV 87.2 90.6 89.1  PLT 273 206 226   Lab Results  Component Value Date   TSH 2.40 02/16/2019   Lab Results  Component Value Date   HGBA1C (H) 08/08/2008    6.2 (NOTE) The ADA recommends the following therapeutic goal for glycemic control related to Hgb A1c measurement: Goal of therapy: <6.5 Hgb A1c  Reference: American Diabetes Association: Clinical Practice Recommendations 2010, Diabetes Care, 2010, 33: (Suppl  1).   Lab Results  Component Value Date   CHOL 197 08/24/2018   HDL 51 08/24/2018   LDLCALC 128 (H) 08/24/2018   TRIG 79 08/24/2018   CHOLHDL 3.9 08/24/2018    Significant Diagnostic Results in last 30 days:  DG Pelvis 1-2 Views  Result Date: 05/11/2019 CLINICAL DATA:  Fall.  Left hip pain EXAM: PELVIS - 1-2 VIEW COMPARISON:  None. FINDINGS: There is no evidence of pelvic fracture or diastasis. No pelvic bone lesions are seen. IMPRESSION: Negative. Electronically Signed   By: Franchot Gallo M.D.   On: 05/11/2019 16:27   CT Head Wo Contrast  Result Date: 05/08/2019 CLINICAL DATA:  Facial and head trauma. Headache. EXAM: CT HEAD WITHOUT CONTRAST TECHNIQUE: Contiguous axial images were obtained from the base of the skull through the vertex without intravenous contrast. COMPARISON:  Brain MRI May 07, 2019 FINDINGS: Brain: No evidence of acute infarction, hemorrhage, hydrocephalus, extra-axial collection or mass lesion/mass effect. Advanced chronic microvascular disease. Stable appearance of chronic infarcts of  the basal ganglia, thalamus  and left cerebellum. Vascular: No hyperdense vessel or unexpected calcification. Skull: Normal. Negative for fracture or focal lesion. Sinuses/Orbits: No acute finding. Other: Left frontal scalp hematoma. IMPRESSION: 1. No acute intracranial abnormality. 2. Advanced brain parenchymal atrophy, chronic microvascular disease. 3. Stable appearance of chronic infarcts of the basal ganglia, thalamus and left cerebellum. Electronically Signed   By: Fidela Salisbury M.D.   On: 05/08/2019 11:35   CT Cervical Spine Wo Contrast  Result Date: 05/08/2019 CLINICAL DATA:  Trauma. Posterior neck pain. EXAM: CT CERVICAL SPINE WITHOUT CONTRAST TECHNIQUE: Multidetector CT imaging of the cervical spine was performed without intravenous contrast. Multiplanar CT image reconstructions were also generated. COMPARISON:  None. FINDINGS: Alignment: Straightening of the cervical lordosis. Skull base and vertebrae: No acute fracture. No primary bone lesion or focal pathologic process. Soft tissues and spinal canal: No prevertebral fluid or swelling. No visible canal hematoma. Disc levels: Multilevel osteoarthritic changes with extensive disc space narrowing, degenerative remodeling of the vertebral bodies. Posterior facet arthropathy. Ankylosing changes at C4-C5, degenerative in etiology. Upper chest: Negative. Other: None. IMPRESSION: 1. No evidence of acute traumatic injury to the cervical spine. 2. Multilevel osteoarthritic changes of the cervical spine. Electronically Signed   By: Fidela Salisbury M.D.   On: 05/08/2019 11:44   MR BRAIN W WO CONTRAST  Result Date: 05/07/2019 CLINICAL DATA:  Memory loss EXAM: MRI HEAD WITHOUT AND WITH CONTRAST TECHNIQUE: Multiplanar, multiecho pulse sequences of the brain and surrounding structures were obtained without and with intravenous contrast. CONTRAST:  62mL MULTIHANCE GADOBENATE DIMEGLUMINE 529 MG/ML IV SOLN COMPARISON:  2017 FINDINGS: Brain: There is no acute infarction or intracranial  hemorrhage. There is no intracranial mass, mass effect, or edema. There is no hydrocephalus or extra-axial fluid collection. Prominence of the ventricles and sulci reflects generalized parenchymal volume loss of which has slightly increased. Confluent areas of T2 hyperintensity in the supratentorial much greater than pontine white matter are nonspecific but probably reflect advanced chronic microvascular ischemic changes. There are chronic infarcts of the central white matter, basal ganglia and thalamus, and left cerebellum. New small chronic right occipital infarct. New focus of susceptibility within the anterior left thalamus likely reflects chronic microhemorrhage. No abnormal enhancement. Vascular: Major vessel flow voids at the skull base are preserved. Skull and upper cervical spine: Normal marrow signal is preserved. Sinuses/Orbits: Paranasal sinuses are aerated. Orbits are unremarkable. Other: Sella is unremarkable.  Mastoid air cells are clear. IMPRESSION: No acute abnormality. Advanced chronic microvascular ischemic changes and chronic infarcts with some progression since 2017. Increased parenchymal volume loss since 2017. Electronically Signed   By: Macy Mis M.D.   On: 05/07/2019 21:23   DG Finger Thumb Right  Result Date: 05/08/2019 CLINICAL DATA:  Laceration following fall EXAM: RIGHT THUMB 2+V COMPARISON:  None. FINDINGS: Frontal, oblique, and lateral views obtained. There is an overlying bandage. No other radiopaque foreign body seen. No evidence soft tissue air. No demonstrable fracture or dislocation. There is severe osteoarthritic change in the first carpal-metacarpal joint. There is moderate osteoarthritic change in the scaphotrapezial joint. There is mild osteoarthritic change in the first IP joint. No erosive change. IMPRESSION: No fracture or dislocation. Osteoarthritic change, most marked in the first carpal-metacarpal joint. No radiopaque foreign body beyond overlying bandage.  Electronically Signed   By: Lowella Grip III M.D.   On: 05/08/2019 10:29   DG FEMUR MIN 2 VIEWS LEFT  Result Date: 05/11/2019 CLINICAL DATA:  Fall. EXAM: LEFT FEMUR 2 VIEWS COMPARISON:  None. FINDINGS: Negative for fracture. Normal hip and knee joint. Mild arterial calcification. IMPRESSION: No acute abnormality Electronically Signed   By: Franchot Gallo M.D.   On: 05/11/2019 16:28   CT Maxillofacial WO CM  Result Date: 05/08/2019 CLINICAL DATA:  Facial and head trauma. EXAM: CT MAXILLOFACIAL WITHOUT CONTRAST TECHNIQUE: Multidetector CT imaging of the maxillofacial structures was performed. Multiplanar CT image reconstructions were also generated. COMPARISON:  None. FINDINGS: Osseous: Minimally displaced distal left nasal bone fracture. Otherwise no facial fractures identified. Orbits: Negative. No traumatic or inflammatory finding. Sinuses: Clear. Soft tissues: Soft tissue swelling of the left frontal scalp and over the nose. Limited intracranial: No significant or unexpected finding. IMPRESSION: 1. Minimally displaced distal left nasal bone fracture. 2. Soft tissue swelling of the left frontal scalp and over the nose. Electronically Signed   By: Fidela Salisbury M.D.   On: 05/08/2019 11:40    Assessment/Plan 1. Sinus bradycardia, chronic Ongoing.HR in the 40's with exercise.Asymptomatic. Latest EKG reviewed 05/08/2019 sinus bradycardia no change from previous EKG. - Ambulatory referral to Cardiology for further evaluation.   2. Oropharyngeal dysphagia Daughters report choking and cough with swallowing.Will have speech therapist to evaluate.  - Ambulatory referral to Speech Therapy  3. Weight loss  Has had a 4 lbs weight loss over 16 days.suspect possible due to poor oral intake with swallowing issues.will have Speech Therapist to evaluate swallowing.will consider adding Remeron 7.5 mg tablet at bedtime if continues to loss weight.Also has Prostate cancer which could be a contributory  factor.   Family/ staff Communication: Reviewed plan of care with patient and daughters verbalized understanding.   Labs/tests ordered: None   Next Appointment: as needed if symptoms worsen or fail to improve.   Sandrea Hughs, NP

## 2019-06-06 ENCOUNTER — Telehealth: Payer: Self-pay | Admitting: *Deleted

## 2019-06-06 NOTE — Telephone Encounter (Signed)
Received FMLA Paperwork from [employer] [phone] for daughter Demietrice Edison Pace. Dinah filled out paperwork and faxed back to [employer] Fax: [phone]  Sent copy for scanning.

## 2019-06-22 ENCOUNTER — Ambulatory Visit: Payer: Medicare HMO | Admitting: Cardiovascular Disease

## 2019-06-27 ENCOUNTER — Ambulatory Visit (INDEPENDENT_AMBULATORY_CARE_PROVIDER_SITE_OTHER): Payer: Medicare HMO | Admitting: Family

## 2019-06-27 ENCOUNTER — Other Ambulatory Visit: Payer: Self-pay

## 2019-06-27 ENCOUNTER — Encounter: Payer: Self-pay | Admitting: Family

## 2019-06-27 DIAGNOSIS — Z0000 Encounter for general adult medical examination without abnormal findings: Secondary | ICD-10-CM | POA: Diagnosis not present

## 2019-06-27 NOTE — Progress Notes (Signed)
    This service is provided via telemedicine  No vital signs collected/recorded due to the encounter was a telemedicine visit.   Location of patient (ex: home, work): Home.  Patient consents to a telephone visit: Yes  Location of the provider (ex: office, home):  [facility]. Name of any referring provider: N/A  Names of all persons participating in the telemedicine service and their role in the encounter:  Patient, Troy Ortiz Daughter, Heriberto [place], [place], [place], [place], NP.    Time spent on call: 8 minutes spent on the phone with Medical Assistant.

## 2019-06-27 NOTE — Patient Instructions (Signed)
[name] , Thank you for taking time to come for your Medicare Wellness Visit. I appreciate your ongoing commitment to your health goals. Please review the following plan we discussed and let me know if I can assist you in the future.   Screening recommendations/referrals: Colonoscopy: Age out  Recommended yearly ophthalmology/optometry visit for glaucoma screening and checkup Recommended yearly dental visit for hygiene and checkup  Vaccinations: Influenza vaccine: Up to date  Pneumococcal vaccine: Up to date  Tdap vaccine:  Up to date due next 06/22/2017  Shingles vaccine: Advised to get shingrix vaccine at his pharmacy      Advanced directives: Yes   Conditions/risks identified: Advance age male > 82 yrs,male Gender,Hypertension and Hx of smoking   Next appointment: 1 year   Preventive Care 82 Years and Older, Male Preventive care refers to lifestyle choices and visits with your health care provider that can promote health and wellness. What does preventive care include?  A yearly physical exam. This is also called an annual well check.  Dental exams once or twice a year.  Routine eye exams. Ask your health care provider how often you should have your eyes checked.  Personal lifestyle choices, including:  Daily care of your teeth and gums.  Regular physical activity.  Eating a healthy diet.  Avoiding tobacco and drug use.  Limiting alcohol use.  Practicing safe sex.  Taking low doses of aspirin every day.  Taking vitamin and mineral supplements as recommended by your health care provider. What happens during an annual well check? The services and screenings done by your health care provider during your annual well check will depend on your age, overall health, lifestyle risk factors, and family history of disease. Counseling  Your health care provider may ask you questions about your:  Alcohol use.  Tobacco use.  Drug use.  Emotional well-being.  Home  and relationship well-being.  Sexual activity.  Eating habits.  History of falls.  Memory and ability to understand (cognition).  Work and work Statistician. Screening  You may have the following tests or measurements:  Height, weight, and BMI.  Blood pressure.  Lipid and cholesterol levels. These may be checked every 5 years, or more frequently if you are over 82 years old.  Skin check.  Lung cancer screening. You may have this screening every year starting at age 82 if you have a 30-pack-year history of smoking and currently smoke or have quit within the past 15 years.  Fecal occult blood test (FOBT) of the stool. You may have this test every year starting at age 82.  Flexible sigmoidoscopy or colonoscopy. You may have a sigmoidoscopy every 5 years or a colonoscopy every 10 years starting at age 82.  Prostate cancer screening. Recommendations will vary depending on your family history and other risks.  Hepatitis C blood test.  Hepatitis B blood test.  Sexually transmitted disease (STD) testing.  Diabetes screening. This is done by checking your blood sugar (glucose) after you have not eaten for a while (fasting). You may have this done every 1-3 years.  Abdominal aortic aneurysm (AAA) screening. You may need this if you are a current or former smoker.  Osteoporosis. You may be screened starting at age 82 if you are at high risk. Talk with your health care provider about your test results, treatment options, and if necessary, the need for more tests. Vaccines  Your health care provider may recommend certain vaccines, such as:  Influenza vaccine. This is recommended  every year.  Tetanus, diphtheria, and acellular pertussis (Tdap, Td) vaccine. You may need a Td booster every 10 years.  Zoster vaccine. You may need this after age 34.  Pneumococcal 13-valent conjugate (PCV13) vaccine. One dose is recommended after age 18.  Pneumococcal polysaccharide (PPSV23) vaccine.  One dose is recommended after age 5. Talk to your health care provider about which screenings and vaccines you need and how often you need them. This information is not intended to replace advice given to you by your health care provider. Make sure you discuss any questions you have with your health care provider. Document Released: 02/16/2015 Document Revised: 10/10/2015 Document Reviewed: 11/21/2014 Elsevier Interactive Patient Education  2017 [place] Prevention in the Home Falls can cause injuries. They can happen to people of all ages. There are many things you can do to make your home safe and to help prevent falls. What can I do on the outside of my home?  Regularly fix the edges of walkways and driveways and fix any cracks.  Remove anything that might make you trip as you walk through a door, such as a raised step or threshold.  Trim any bushes or trees on the path to your home.  Use bright outdoor lighting.  Clear any walking paths of anything that might make someone trip, such as rocks or tools.  Regularly check to see if handrails are loose or broken. Make sure that both sides of any steps have handrails.  Any raised decks and porches should have guardrails on the edges.  Have any leaves, snow, or ice cleared regularly.  Use sand or salt on walking paths during winter.  Clean up any spills in your garage right away. This includes oil or grease spills. What can I do in the bathroom?  Use night lights.  Install grab bars by the toilet and in the tub and shower. Do not use towel bars as grab bars.  Use non-skid mats or decals in the tub or shower.  If you need to sit down in the shower, use a plastic, non-slip stool.  Keep the floor dry. Clean up any water that spills on the floor as soon as it happens.  Remove soap buildup in the tub or shower regularly.  Attach bath mats securely with double-sided non-slip rug tape.  Do not have throw rugs and other  things on the floor that can make you trip. What can I do in the bedroom?  Use night lights.  Make sure that you have a light by your bed that is easy to reach.  Do not use any sheets or blankets that are too big for your bed. They should not hang down onto the floor.  Have a firm chair that has side arms. You can use this for support while you get dressed.  Do not have throw rugs and other things on the floor that can make you trip. What can I do in the kitchen?  Clean up any spills right away.  Avoid walking on wet floors.  Keep items that you use a lot in easy-to-reach places.  If you need to reach something above you, use a strong step stool that has a grab bar.  Keep electrical cords out of the way.  Do not use floor polish or wax that makes floors slippery. If you must use wax, use non-skid floor wax.  Do not have throw rugs and other things on the floor that can make you trip. What  can I do with my stairs?  Do not leave any items on the stairs.  Make sure that there are handrails on both sides of the stairs and use them. Fix handrails that are broken or loose. Make sure that handrails are as long as the stairways.  Check any carpeting to make sure that it is firmly attached to the stairs. Fix any carpet that is loose or worn.  Avoid having throw rugs at the top or bottom of the stairs. If you do have throw rugs, attach them to the floor with carpet tape.  Make sure that you have a light switch at the top of the stairs and the bottom of the stairs. If you do not have them, ask someone to add them for you. What else can I do to help prevent falls?  Wear shoes that:  Do not have high heels.  Have rubber bottoms.  Are comfortable and fit you well.  Are closed at the toe. Do not wear sandals.  If you use a stepladder:  Make sure that it is fully opened. Do not climb a closed stepladder.  Make sure that both sides of the stepladder are locked into place.  Ask  someone to hold it for you, if possible.  Clearly mark and make sure that you can see:  Any grab bars or handrails.  First and last steps.  Where the edge of each step is.  Use tools that help you move around (mobility aids) if they are needed. These include:  Canes.  Walkers.  Scooters.  Crutches.  Turn on the lights when you go into a dark area. Replace any light bulbs as soon as they burn out.  Set up your furniture so you have a clear path. Avoid moving your furniture around.  If any of your floors are uneven, fix them.  If there are any pets around you, be aware of where they are.  Review your medicines with your doctor. Some medicines can make you feel dizzy. This can increase your chance of falling. Ask your doctor what other things that you can do to help prevent falls. This information is not intended to replace advice given to you by your health care provider. Make sure you discuss any questions you have with your health care provider. Document Released: 11/16/2008 Document Revised: 06/28/2015 Document Reviewed: 02/24/2014 Elsevier Interactive Patient Education  2017 [employer].

## 2019-06-27 NOTE — Progress Notes (Signed)
Subjective:   Claude Kil is a 84 y.o. male who presents for Medicare Annual/Subsequent preventive examination.  Review of Systems:  Cardiac Risk Factors include: advanced age (>10men, >55 women);hypertension;male gender;smoking/ tobacco exposure     Objective:    Vitals: There were no vitals taken for this visit.  There is no height or weight on file to calculate BMI.  Advanced Directives 06/27/2019 05/08/2019 08/20/2018 06/23/2018 11/25/2017 04/28/2017 10/21/2016  Does Patient Have a Medical Advance Directive? Yes No No No No No No  Type of Paramedic of [place];Living will;Out of facility DNR (pink MOST or yellow form) - - - - - -  Does patient want to make changes to medical advance directive? No - Patient declined - No - Patient declined No - Patient declined - - -  Copy of [place] in Chart? No - copy requested - - - - - -  Would patient like information on creating a medical advance directive? - - - - - Yes (MAU/Ambulatory/Procedural Areas - Information given) Yes (ED - Information included in AVS)    Tobacco Social History   Tobacco Use  Smoking Status Former Smoker  . Years: 30.00  Smokeless Tobacco Never Used  Tobacco Comment   Quit at age 29     Counseling given: Not Answered Comment: Quit at age 80   Clinical Intake:  Pre-visit preparation completed: No        BMI - recorded: 23.3 Nutritional Status: BMI of 19-24  Normal Nutritional Risks: None Diabetes: No  How often do you need to have someone help you when you read instructions, pamphlets, or other written materials from your doctor or pharmacy?: 5 - Always(daughter assist) What is the last grade level you completed in school?: 7 grade  Interpreter Needed?: No  Information entered by :: Takeria Marquina FNP-C  Past Medical History:  Diagnosis Date  . Allergic rhinitis, cause unspecified   . Allergy   . Arthritis   . Asthma    AS A CHILD  . Cervicalgia    . Conjunctivitis unspecified   . Dizziness and giddiness   . Enthesopathy of unspecified site   . Generalized hyperhidrosis   . Lumbago   . Neoplasm of uncertain behavior of skin   . Other abnormal glucose   . Other and unspecified hyperlipidemia   . Rash and other nonspecific skin eruption   . Reflux esophagitis   . Stroke [facility])    MINI STROKES  . Unspecified constipation   . Unspecified essential hypertension   . Unspecified late effects of cerebrovascular disease    Past Surgical History:  Procedure Laterality Date  . COLONOSCOPY     Family History  Problem Relation Age of Onset  . Heart disease Mother    Social History   Socioeconomic History  . Marital status: Married    Spouse name: Not on file  . Number of children: 33  . Years of education: Not on file  . Highest education level: Not on file  Occupational History  . Occupation: Glass blower/designer    Comment: retired  . Occupation: Corporate treasurer    Comment: retired  Tobacco Use  . Smoking status: Former Smoker    Years: 30.00  . Smokeless tobacco: Never Used  . Tobacco comment: Quit at age 89  Substance and Sexual Activity  . Alcohol use: No  . Drug use: No  . Sexual activity: Yes    Partners: Female  Other Topics Concern  .  Not on file  Social History Narrative   Right handed      Completed HS      [employer]      Lives in one story home with wife   Social Determinants of Health   Financial Resource Strain:   . Difficulty of Paying Living Expenses:   Food Insecurity:   . Worried About Charity fundraiser in the Last Year:   . Arboriculturist in the Last Year:   Transportation Needs:   . Film/video editor (Medical):   Marland Kitchen Lack of Transportation (Non-Medical):   Physical Activity:   . Days of Exercise per Week:   . Minutes of Exercise per Session:   Stress:   . Feeling of Stress :   Social Connections:   . Frequency of Communication with Friends and Family:   . Frequency of Social Gatherings  with Friends and Family:   . Attends Religious Services:   . Active Member of Clubs or Organizations:   . Attends Archivist Meetings:   Marland Kitchen Marital Status:     Outpatient Encounter Medications as of 06/27/2019  Medication Sig  . amLODipine (NORVASC) 10 MG tablet Take 1 tablet (10 mg total) by mouth daily.  . Cyanocobalamin (B-12 PO) Take 1 tablet by mouth daily. ? Dose, possibly 2000 units qd  . Darolutamide (NUBEQA) 300 MG TABS Take 300 mg by mouth 2 (two) times daily.  Marland Kitchen escitalopram (LEXAPRO) 10 MG tablet Take 1 tablet every evening  . lisinopril (ZESTRIL) 5 MG tablet Take 1 tablet (5 mg total) by mouth daily. for high blood pressure  . loratadine (CLARITIN) 10 MG tablet Take 10 mg by mouth as needed for allergies.  . Meclizine HCl (TRAVEL SICKNESS) 25 MG CHEW CHEW AND SWALLOW 1 TABLET BY MOUTH THREE TIMES DAILY AS NEEDED FOR DIZZINESS   No facility-administered encounter medications on file as of 06/27/2019.    Activities of Daily Living In your present state of health, do you have any difficulty performing the following activities: 06/27/2019  Hearing? N  Vision? N  Difficulty concentrating or making decisions? Y  Comment remembering  Walking or climbing stairs? Y  Dressing or bathing? Y  Comment daughter assist  Doing errands, shopping? Y  Comment daughter [place] and eating ? Y  Comment daughter assist  Using the Toilet? N  In the past six months, have you accidently leaked urine? Y  Do you have problems with loss of bowel control? N  Managing your Medications? Y  Comment daughter assist  Managing your Finances? Y  Comment daughter assist  Housekeeping or managing your Housekeeping? Y  Comment family assist  Some recent data might be hidden    Patient Care Team: Naseer Hearn, Nelda Bucks, NP as PCP - General (Family Medicine) Kathie Rhodes, MD as Consulting Physician (Urology)   Assessment:   This is a routine wellness examination for  [place].  Exercise Activities and Dietary recommendations Current Exercise Habits: The patient does not participate in regular exercise at present, Exercise limited by: Other - see comments(cognitive impairment)  Goals    . Exercise 3x per week (30 min per time)     Patient will go back to silver sneakers     . Increase water intake     Starting 04/03/16 I will attempt to increase my water intake to 6 glasses a day.       Fall Risk Fall Risk  06/27/2019 06/01/2019 05/11/2019 02/22/2019 02/16/2019  Falls in the past year? 1 1 1  0 0  Number falls in past yr: 0 - 1 0 0  Injury with Fall? 1 1 1  0 0  Risk for fall due to : - - - - -  Follow up - - - - Falls evaluation completed   Is the patient's home free of loose throw rugs in walkways, pet beds, electrical cords, etc?   no      Grab bars in the bathroom? yes      Handrails on the stairs?   no      Adequate lighting?   yes  Depression Screen PHQ 2/9 Scores 06/27/2019 02/22/2019 06/23/2018 10/02/2017  PHQ - 2 Score 0 0 0 0    Cognitive Function MMSE - Mini Mental State Exam 11/10/2018 04/28/2017 04/03/2016 02/22/2016 08/29/2015  Orientation to time 3 5 5 5 5   Orientation to Place 3 5 4 5 5   Registration 3 3 3 3 3   Attention/ Calculation 5 5 5 5 5   Recall 0 1 3 1  0  Language- name 2 objects 2 2 2 2 2   Language- repeat 1 1 1 1 1   Language- follow 3 step command 3 3 3 3 3   Language- read & follow direction 1 1 1 1 1   Write a sentence 1 1 1 1 1   Copy design 1 1 1 1 1   Total score 23 28 29 28 27      6CIT Screen 06/27/2019 06/23/2018  What Year? 4 points 0 points  What month? 3 points 0 points  What time? 3 points 0 points  Count back from 20 4 points 0 points  Months in reverse 4 points 0 points  Repeat phrase 10 points 0 points  Total Score 28 0    Immunization History  Administered Date(s) Administered  . Fluad Quad(high Dose 65+) 11/10/2018  . Influenza Split 12/20/2009, 10/25/2011  . Influenza, High Dose Seasonal PF 09/30/2016   . Influenza,inj,Quad PF,6+ Mos 01/24/2013, 12/23/2013  . Influenza-Unspecified 12/20/2014, 11/28/2015  . PFIZER SARS-COV-2 Vaccination 03/27/2019, 04/20/2019  . Pneumococcal Conjugate-13 08/07/2014  . Pneumococcal Polysaccharide-23 11/28/2015  . Tdap 05/08/2019    Qualifies for Shingles Vaccine? Per daughter refused   Screening Tests Health Maintenance  Topic Date Due  . INFLUENZA VACCINE  09/04/2019  . TETANUS/TDAP  05/07/2029  . COVID-19 Vaccine  Completed  . PNA vac Low Risk Adult  Completed  . COLONOSCOPY  Discontinued   Cancer Screenings: Lung: Low Dose CT Chest recommended if Age 30-80 years, 30 pack-year currently smoking OR have quit w/in 15years. Patient does not qualify. Colorectal: Age out   Additional Screenings: Hepatitis C Screening:Low Risk    Plan:   - Shingrix Vaccine   I have personally reviewed and noted the following in the patient's chart:   . Medical and social history . Use of alcohol, tobacco or illicit drugs  . Current medications and supplements . Functional ability and status . Nutritional status . Physical activity . Advanced directives . List of other physicians . Hospitalizations, surgeries, and ER visits in previous 12 months . Vitals . Screenings to include cognitive, depression, and falls . Referrals and appointments  In addition, I have reviewed and discussed with patient certain preventive protocols, quality metrics, and best practice recommendations. A written personalized care plan for preventive services as well as general preventive health recommendations were provided to patient.  Sandrea Hughs, NP  06/27/2019

## 2019-07-05 ENCOUNTER — Ambulatory Visit: Payer: Medicare HMO | Admitting: Family

## 2019-07-06 ENCOUNTER — Other Ambulatory Visit: Payer: Self-pay | Admitting: Family

## 2019-07-06 DIAGNOSIS — R1312 Dysphagia, oropharyngeal phase: Secondary | ICD-10-CM

## 2019-08-22 ENCOUNTER — Other Ambulatory Visit: Payer: Medicare HMO

## 2019-08-22 ENCOUNTER — Ambulatory Visit: Payer: Medicare HMO | Admitting: Neurology

## 2019-08-25 ENCOUNTER — Ambulatory Visit: Payer: Medicare HMO | Admitting: Family

## 2019-08-26 ENCOUNTER — Ambulatory Visit: Payer: Medicare HMO | Admitting: Family

## 2019-08-29 ENCOUNTER — Encounter (HOSPITAL_COMMUNITY): Payer: Self-pay

## 2019-08-29 ENCOUNTER — Encounter (HOSPITAL_COMMUNITY): Payer: Medicare HMO

## 2019-09-04 DEATH — deceased
